# Patient Record
Sex: Female | Born: 1965 | Race: White | Hispanic: No | Marital: Married | Smoking: Former smoker
Health system: Southern US, Community
[De-identification: ages and names within clinical notes are randomized; demographics above are authoritative.]

## PROBLEM LIST (undated history)

## (undated) DIAGNOSIS — E0500 Thyrotoxicosis with diffuse goiter without thyrotoxic crisis or storm: Secondary | ICD-10-CM

## (undated) DIAGNOSIS — R011 Cardiac murmur, unspecified: Secondary | ICD-10-CM

## (undated) DIAGNOSIS — J45909 Unspecified asthma, uncomplicated: Secondary | ICD-10-CM

## (undated) DIAGNOSIS — K219 Gastro-esophageal reflux disease without esophagitis: Secondary | ICD-10-CM

## (undated) DIAGNOSIS — Z862 Personal history of diseases of the blood and blood-forming organs and certain disorders involving the immune mechanism: Secondary | ICD-10-CM

## (undated) DIAGNOSIS — M6530 Trigger finger, unspecified finger: Secondary | ICD-10-CM

## (undated) DIAGNOSIS — I10 Essential (primary) hypertension: Secondary | ICD-10-CM

## (undated) DIAGNOSIS — Z794 Long term (current) use of insulin: Secondary | ICD-10-CM

## (undated) DIAGNOSIS — Z98811 Dental restoration status: Secondary | ICD-10-CM

## (undated) DIAGNOSIS — M1990 Unspecified osteoarthritis, unspecified site: Secondary | ICD-10-CM

## (undated) DIAGNOSIS — E119 Type 2 diabetes mellitus without complications: Secondary | ICD-10-CM

## (undated) DIAGNOSIS — Z8489 Family history of other specified conditions: Secondary | ICD-10-CM

## (undated) DIAGNOSIS — IMO0001 Reserved for inherently not codable concepts without codable children: Secondary | ICD-10-CM

## (undated) DIAGNOSIS — D496 Neoplasm of unspecified behavior of brain: Secondary | ICD-10-CM

## (undated) HISTORY — PX: FOOT SURGERY: SHX648

## (undated) HISTORY — DX: Type 2 diabetes mellitus without complications: E11.9

## (undated) HISTORY — PX: TUBAL LIGATION: SHX77

## (undated) HISTORY — PX: TONSILLECTOMY AND ADENOIDECTOMY: SHX28

## (undated) HISTORY — PX: UMBILICAL HERNIA REPAIR: SHX196

## (undated) HISTORY — PX: SHOULDER ARTHROSCOPY WITH ROTATOR CUFF REPAIR: SHX5685

---

## 2003-10-02 ENCOUNTER — Emergency Department (HOSPITAL_COMMUNITY): Admission: EM | Admit: 2003-10-02 | Discharge: 2003-10-02 | Payer: Self-pay | Admitting: Emergency Medicine

## 2005-05-21 ENCOUNTER — Encounter: Admission: RE | Admit: 2005-05-21 | Discharge: 2005-05-21 | Payer: Self-pay | Admitting: General Surgery

## 2005-05-21 ENCOUNTER — Encounter (INDEPENDENT_AMBULATORY_CARE_PROVIDER_SITE_OTHER): Payer: Self-pay | Admitting: *Deleted

## 2005-05-30 ENCOUNTER — Ambulatory Visit (HOSPITAL_COMMUNITY): Admission: RE | Admit: 2005-05-30 | Discharge: 2005-05-30 | Payer: Self-pay | Admitting: General Surgery

## 2005-05-30 ENCOUNTER — Ambulatory Visit (HOSPITAL_BASED_OUTPATIENT_CLINIC_OR_DEPARTMENT_OTHER): Admission: RE | Admit: 2005-05-30 | Discharge: 2005-05-30 | Payer: Self-pay | Admitting: General Surgery

## 2005-05-30 ENCOUNTER — Encounter (INDEPENDENT_AMBULATORY_CARE_PROVIDER_SITE_OTHER): Payer: Self-pay | Admitting: *Deleted

## 2005-05-30 HISTORY — PX: BREAST MASS EXCISION: SHX1267

## 2006-01-04 ENCOUNTER — Encounter: Admission: RE | Admit: 2006-01-04 | Discharge: 2006-01-04 | Payer: Self-pay | Admitting: General Surgery

## 2006-10-30 ENCOUNTER — Encounter: Admission: RE | Admit: 2006-10-30 | Discharge: 2006-10-30 | Payer: Self-pay | Admitting: Family Medicine

## 2007-04-22 ENCOUNTER — Encounter: Admission: RE | Admit: 2007-04-22 | Discharge: 2007-04-22 | Payer: Self-pay | Admitting: Family Medicine

## 2007-11-04 ENCOUNTER — Encounter: Admission: RE | Admit: 2007-11-04 | Discharge: 2007-11-04 | Payer: Self-pay | Admitting: Family Medicine

## 2007-12-26 ENCOUNTER — Emergency Department (HOSPITAL_BASED_OUTPATIENT_CLINIC_OR_DEPARTMENT_OTHER): Admission: EM | Admit: 2007-12-26 | Discharge: 2007-12-26 | Payer: Self-pay | Admitting: Emergency Medicine

## 2008-11-15 ENCOUNTER — Encounter: Admission: RE | Admit: 2008-11-15 | Discharge: 2008-11-15 | Payer: Self-pay | Admitting: Internal Medicine

## 2010-04-13 ENCOUNTER — Emergency Department (HOSPITAL_COMMUNITY): Admission: EM | Admit: 2010-04-13 | Discharge: 2010-04-13 | Payer: Self-pay | Admitting: Emergency Medicine

## 2010-08-06 ENCOUNTER — Encounter: Payer: Self-pay | Admitting: General Surgery

## 2010-09-28 LAB — DIFFERENTIAL
Basophils Absolute: 0.1 10*3/uL (ref 0.0–0.1)
Basophils Relative: 1 % (ref 0–1)
Eosinophils Absolute: 0.4 10*3/uL (ref 0.0–0.7)
Eosinophils Relative: 3 % (ref 0–5)
Lymphocytes Relative: 24 % (ref 12–46)
Lymphs Abs: 2.8 10*3/uL (ref 0.7–4.0)
Monocytes Absolute: 0.8 10*3/uL (ref 0.1–1.0)
Monocytes Relative: 7 % (ref 3–12)
Neutro Abs: 7.7 10*3/uL (ref 1.7–7.7)
Neutrophils Relative %: 66 % (ref 43–77)

## 2010-09-28 LAB — URINALYSIS, ROUTINE W REFLEX MICROSCOPIC
Bilirubin Urine: NEGATIVE
Glucose, UA: NEGATIVE mg/dL
Ketones, ur: NEGATIVE mg/dL
Leukocytes, UA: NEGATIVE
Nitrite: NEGATIVE
Protein, ur: NEGATIVE mg/dL
Specific Gravity, Urine: 1.002 — ABNORMAL LOW (ref 1.005–1.030)
Urobilinogen, UA: 0.2 mg/dL (ref 0.0–1.0)
pH: 6 (ref 5.0–8.0)

## 2010-09-28 LAB — BASIC METABOLIC PANEL
BUN: 8 mg/dL (ref 6–23)
CO2: 24 mEq/L (ref 19–32)
Calcium: 9.2 mg/dL (ref 8.4–10.5)
Chloride: 104 mEq/L (ref 96–112)
Creatinine, Ser: 0.54 mg/dL (ref 0.4–1.2)
GFR calc Af Amer: >60 mL/min (ref >60)
GFR calc non Af Amer: >60 mL/min (ref >60)
Glucose, Bld: 169 mg/dL — ABNORMAL HIGH (ref 70–99)
Potassium: 3.9 mEq/L (ref 3.5–5.1)
Sodium: 135 mEq/L (ref 135–145)

## 2010-09-28 LAB — GLUCOSE, CAPILLARY: Glucose-Capillary: 131 mg/dL — ABNORMAL HIGH (ref 70–99)

## 2010-09-28 LAB — URINE MICROSCOPIC-ADD ON

## 2010-09-28 LAB — CBC
HCT: 44 % (ref 36.0–46.0)
Hemoglobin: 14.5 g/dL (ref 12.0–15.0)
MCH: 27.8 pg (ref 26.0–34.0)
MCHC: 33 g/dL (ref 30.0–36.0)
MCV: 84.5 fL (ref 78.0–100.0)
Platelets: 301 10*3/uL (ref 150–400)
RBC: 5.21 MIL/uL — ABNORMAL HIGH (ref 3.87–5.11)
RDW: 13 % (ref 11.5–15.5)
WBC: 11.6 10*3/uL — ABNORMAL HIGH (ref 4.0–10.5)

## 2010-12-01 NOTE — Op Note (Signed)
[name], Karen Wu                   ACCOUNT NO.:  [id number]   MEDICAL RECORD NO.:  [id number]          PATIENT TYPE:  AMB   LOCATION:  DSC                          FACILITY:  [facility]   PHYSICIAN:  Rose Phi. Maple Hudson, M.D.   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   OPERATION:  Excision of a left breast mass.   SURGEON:  Rose Phi. Maple Hudson, M.D.   ANESTHESIA:  General.   DESCRIPTION OF PROCEDURE:  The patient was placed on the operating room  table after suitable general anesthesia was induced, and the left breast  prepped and draped in the usual fashion.  The palpable nodule was deep in  the breast at about the 2:30 position.  A curved incision overlying it was  made, and then an excision of the mass was carried out.  Hemostasis was  obtained with the cautery.  The tissue was infiltrated with 0.25% Marcaine  and then closed in a single layer with subcuticular #4-0 Monocryl and Steri-  Strips.  A dressing was applied.   The patient was transferred to the recovery room in satisfactory condition,  having tolerated the procedure well.      Rose Phi. Maple Hudson, M.D.  Electronically Signed     PRY/MEDQ  D:  05/30/2005  T:  05/30/2005  Job:  [id number]

## 2011-04-12 LAB — BASIC METABOLIC PANEL
BUN: 11
Chloride: 104
Glucose, Bld: 267 — ABNORMAL HIGH
Potassium: 4.2

## 2011-04-12 LAB — CBC
HCT: 38.7
MCV: 84.1
Platelets: 247
RDW: 12
WBC: 8.5

## 2011-04-12 LAB — DIFFERENTIAL
Eosinophils Absolute: 0.2
Eosinophils Relative: 2
Lymphs Abs: 1.4

## 2011-04-12 LAB — URINALYSIS, ROUTINE W REFLEX MICROSCOPIC
Bilirubin Urine: NEGATIVE
Glucose, UA: >1000 — AB
Hgb urine dipstick: NEGATIVE
Ketones, ur: NEGATIVE
Leukocytes, UA: NEGATIVE
Nitrite: NEGATIVE
Protein, ur: NEGATIVE
Specific Gravity, Urine: 1.028
Urobilinogen, UA: 0.2
pH: 5.5

## 2011-04-12 LAB — URINE MICROSCOPIC-ADD ON

## 2011-04-12 LAB — PREGNANCY, URINE: Preg Test, Ur: NEGATIVE

## 2011-10-29 ENCOUNTER — Other Ambulatory Visit: Payer: Self-pay | Admitting: Family Medicine

## 2011-10-29 DIAGNOSIS — Z1231 Encounter for screening mammogram for malignant neoplasm of breast: Secondary | ICD-10-CM

## 2011-11-06 ENCOUNTER — Ambulatory Visit
Admission: RE | Admit: 2011-11-06 | Discharge: 2011-11-06 | Disposition: A | Discharge status: Home / Self Care | Payer: Federal, State, Local not specified - PPO | Source: Ambulatory Visit | Attending: Family Medicine | Admitting: Family Medicine

## 2011-11-06 DIAGNOSIS — Z1231 Encounter for screening mammogram for malignant neoplasm of breast: Secondary | ICD-10-CM

## 2012-08-06 ENCOUNTER — Encounter (INDEPENDENT_AMBULATORY_CARE_PROVIDER_SITE_OTHER): Payer: Federal, State, Local not specified - PPO | Admitting: Ophthalmology

## 2012-08-06 DIAGNOSIS — H43819 Vitreous degeneration, unspecified eye: Secondary | ICD-10-CM

## 2012-08-06 DIAGNOSIS — H4600 Optic papillitis, unspecified eye: Secondary | ICD-10-CM

## 2012-08-06 DIAGNOSIS — H2510 Age-related nuclear cataract, unspecified eye: Secondary | ICD-10-CM

## 2012-08-06 DIAGNOSIS — E1165 Type 2 diabetes mellitus with hyperglycemia: Secondary | ICD-10-CM

## 2012-08-06 DIAGNOSIS — H35039 Hypertensive retinopathy, unspecified eye: Secondary | ICD-10-CM

## 2012-08-06 DIAGNOSIS — E11319 Type 2 diabetes mellitus with unspecified diabetic retinopathy without macular edema: Secondary | ICD-10-CM

## 2012-08-06 DIAGNOSIS — E1139 Type 2 diabetes mellitus with other diabetic ophthalmic complication: Secondary | ICD-10-CM

## 2012-08-06 DIAGNOSIS — I10 Essential (primary) hypertension: Secondary | ICD-10-CM

## 2012-09-03 ENCOUNTER — Ambulatory Visit: Payer: Self-pay | Admitting: Ophthalmology

## 2012-09-03 LAB — CREATININE, SERUM: EGFR (African American): >60

## 2012-10-22 DIAGNOSIS — G939 Disorder of brain, unspecified: Secondary | ICD-10-CM | POA: Insufficient documentation

## 2014-01-13 HISTORY — PX: ENDOMETRIAL ABLATION: SHX621

## 2014-01-13 HISTORY — PX: BLADDER SUSPENSION: SHX72

## 2014-03-16 HISTORY — PX: MICROLARYNGOSCOPY WITH CO2 LASER AND EXCISION OF VOCAL CORD LESION: SHX5970

## 2014-06-02 DIAGNOSIS — D429 Neoplasm of uncertain behavior of meninges, unspecified: Secondary | ICD-10-CM | POA: Insufficient documentation

## 2014-10-15 DIAGNOSIS — M6530 Trigger finger, unspecified finger: Secondary | ICD-10-CM

## 2014-10-15 HISTORY — DX: Trigger finger, unspecified finger: M65.30

## 2014-10-21 ENCOUNTER — Other Ambulatory Visit: Payer: Self-pay | Admitting: Orthopedic Surgery

## 2014-10-21 ENCOUNTER — Encounter (HOSPITAL_BASED_OUTPATIENT_CLINIC_OR_DEPARTMENT_OTHER): Payer: Self-pay | Admitting: *Deleted

## 2014-10-22 NOTE — Pre-Procedure Instructions (Addendum)
To come for BMET and EKG; labs done at [place] are > 30 days, and last EKG was in 2014. EKG from 01/14/2014 received from [facility]

## 2014-10-26 ENCOUNTER — Encounter (HOSPITAL_BASED_OUTPATIENT_CLINIC_OR_DEPARTMENT_OTHER)
Admission: RE | Admit: 2014-10-26 | Discharge: 2014-10-26 | Disposition: A | Discharge status: Home / Self Care | Payer: Federal, State, Local not specified - PPO | Source: Ambulatory Visit | Attending: Orthopedic Surgery | Admitting: Orthopedic Surgery

## 2014-10-26 DIAGNOSIS — F17210 Nicotine dependence, cigarettes, uncomplicated: Secondary | ICD-10-CM | POA: Diagnosis not present

## 2014-10-26 DIAGNOSIS — E119 Type 2 diabetes mellitus without complications: Secondary | ICD-10-CM | POA: Diagnosis not present

## 2014-10-26 DIAGNOSIS — K219 Gastro-esophageal reflux disease without esophagitis: Secondary | ICD-10-CM | POA: Diagnosis not present

## 2014-10-26 DIAGNOSIS — M1990 Unspecified osteoarthritis, unspecified site: Secondary | ICD-10-CM | POA: Diagnosis not present

## 2014-10-26 DIAGNOSIS — I10 Essential (primary) hypertension: Secondary | ICD-10-CM | POA: Diagnosis not present

## 2014-10-26 DIAGNOSIS — Z9851 Tubal ligation status: Secondary | ICD-10-CM | POA: Diagnosis not present

## 2014-10-26 DIAGNOSIS — Z794 Long term (current) use of insulin: Secondary | ICD-10-CM | POA: Diagnosis not present

## 2014-10-26 DIAGNOSIS — M65311 Trigger thumb, right thumb: Secondary | ICD-10-CM | POA: Diagnosis present

## 2014-10-26 DIAGNOSIS — D649 Anemia, unspecified: Secondary | ICD-10-CM | POA: Diagnosis not present

## 2014-10-26 DIAGNOSIS — E0500 Thyrotoxicosis with diffuse goiter without thyrotoxic crisis or storm: Secondary | ICD-10-CM | POA: Diagnosis not present

## 2014-10-26 DIAGNOSIS — J45909 Unspecified asthma, uncomplicated: Secondary | ICD-10-CM | POA: Diagnosis not present

## 2014-10-26 DIAGNOSIS — Z7982 Long term (current) use of aspirin: Secondary | ICD-10-CM | POA: Diagnosis not present

## 2014-10-26 LAB — BASIC METABOLIC PANEL
Anion gap: 12 (ref 5–15)
BUN: <5 mg/dL — ABNORMAL LOW (ref 6–23)
CALCIUM: 8.9 mg/dL (ref 8.4–10.5)
CHLORIDE: 101 mmol/L (ref 96–112)
CO2: 23 mmol/L (ref 19–32)
CREATININE: 0.6 mg/dL (ref 0.50–1.10)
GFR calc Af Amer: >90 mL/min (ref >90)
GLUCOSE: 133 mg/dL — AB (ref 70–99)
POTASSIUM: 4.3 mmol/L (ref 3.5–5.1)
Sodium: 136 mmol/L (ref 135–145)

## 2014-10-28 ENCOUNTER — Ambulatory Visit (HOSPITAL_BASED_OUTPATIENT_CLINIC_OR_DEPARTMENT_OTHER): Payer: Federal, State, Local not specified - PPO | Admitting: Anesthesiology

## 2014-10-28 ENCOUNTER — Encounter (HOSPITAL_BASED_OUTPATIENT_CLINIC_OR_DEPARTMENT_OTHER)
Admission: RE | Disposition: A | Discharge status: Home / Self Care | Payer: Self-pay | Source: Ambulatory Visit | Attending: Orthopedic Surgery

## 2014-10-28 ENCOUNTER — Encounter (HOSPITAL_BASED_OUTPATIENT_CLINIC_OR_DEPARTMENT_OTHER): Payer: Self-pay | Admitting: *Deleted

## 2014-10-28 ENCOUNTER — Ambulatory Visit (HOSPITAL_BASED_OUTPATIENT_CLINIC_OR_DEPARTMENT_OTHER)
Admission: RE | Admit: 2014-10-28 | Discharge: 2014-10-28 | Disposition: A | Discharge status: Home / Self Care | Payer: Federal, State, Local not specified - PPO | Source: Ambulatory Visit | Attending: Orthopedic Surgery | Admitting: Orthopedic Surgery

## 2014-10-28 DIAGNOSIS — J45909 Unspecified asthma, uncomplicated: Secondary | ICD-10-CM | POA: Insufficient documentation

## 2014-10-28 DIAGNOSIS — Z9851 Tubal ligation status: Secondary | ICD-10-CM | POA: Insufficient documentation

## 2014-10-28 DIAGNOSIS — M65311 Trigger thumb, right thumb: Secondary | ICD-10-CM | POA: Diagnosis not present

## 2014-10-28 DIAGNOSIS — E0500 Thyrotoxicosis with diffuse goiter without thyrotoxic crisis or storm: Secondary | ICD-10-CM | POA: Insufficient documentation

## 2014-10-28 DIAGNOSIS — Z794 Long term (current) use of insulin: Secondary | ICD-10-CM | POA: Insufficient documentation

## 2014-10-28 DIAGNOSIS — F17210 Nicotine dependence, cigarettes, uncomplicated: Secondary | ICD-10-CM | POA: Insufficient documentation

## 2014-10-28 DIAGNOSIS — I10 Essential (primary) hypertension: Secondary | ICD-10-CM | POA: Insufficient documentation

## 2014-10-28 DIAGNOSIS — D649 Anemia, unspecified: Secondary | ICD-10-CM | POA: Insufficient documentation

## 2014-10-28 DIAGNOSIS — Z7982 Long term (current) use of aspirin: Secondary | ICD-10-CM | POA: Insufficient documentation

## 2014-10-28 DIAGNOSIS — M1990 Unspecified osteoarthritis, unspecified site: Secondary | ICD-10-CM | POA: Insufficient documentation

## 2014-10-28 DIAGNOSIS — K219 Gastro-esophageal reflux disease without esophagitis: Secondary | ICD-10-CM | POA: Insufficient documentation

## 2014-10-28 DIAGNOSIS — E119 Type 2 diabetes mellitus without complications: Secondary | ICD-10-CM | POA: Insufficient documentation

## 2014-10-28 HISTORY — DX: Unspecified osteoarthritis, unspecified site: M19.90

## 2014-10-28 HISTORY — DX: Neoplasm of unspecified behavior of brain: D49.6

## 2014-10-28 HISTORY — DX: Unspecified asthma, uncomplicated: J45.909

## 2014-10-28 HISTORY — DX: Cardiac murmur, unspecified: R01.1

## 2014-10-28 HISTORY — DX: Reserved for inherently not codable concepts without codable children: IMO0001

## 2014-10-28 HISTORY — DX: Dental restoration status: Z98.811

## 2014-10-28 HISTORY — DX: Family history of other specified conditions: Z84.89

## 2014-10-28 HISTORY — DX: Long term (current) use of insulin: Z79.4

## 2014-10-28 HISTORY — DX: Trigger finger, unspecified finger: M65.30

## 2014-10-28 HISTORY — PX: TRIGGER FINGER RELEASE: SHX641

## 2014-10-28 HISTORY — DX: Personal history of diseases of the blood and blood-forming organs and certain disorders involving the immune mechanism: Z86.2

## 2014-10-28 HISTORY — DX: Thyrotoxicosis with diffuse goiter without thyrotoxic crisis or storm: E05.00

## 2014-10-28 HISTORY — DX: Essential (primary) hypertension: I10

## 2014-10-28 HISTORY — DX: Type 2 diabetes mellitus without complications: E11.9

## 2014-10-28 HISTORY — DX: Gastro-esophageal reflux disease without esophagitis: K21.9

## 2014-10-28 LAB — GLUCOSE, CAPILLARY
GLUCOSE-CAPILLARY: 151 mg/dL — AB (ref 70–99)
Glucose-Capillary: 173 mg/dL — ABNORMAL HIGH (ref 70–99)

## 2014-10-28 LAB — POCT HEMOGLOBIN-HEMACUE: Hemoglobin: 14.4 g/dL (ref 12.0–15.0)

## 2014-10-28 SURGERY — RELEASE, A1 PULLEY, FOR TRIGGER FINGER
Anesthesia: Monitor Anesthesia Care | Site: Finger | Laterality: Right

## 2014-10-28 MED ORDER — OXYCODONE HCL 5 MG/5ML PO SOLN: 5.0000 mg | Freq: Once | ORAL | Status: DC | PRN

## 2014-10-28 MED ORDER — MIDAZOLAM HCL 5 MG/5ML IJ SOLN
INTRAMUSCULAR | Status: DC | PRN
  Administered 2014-10-28: 2 mg via INTRAVENOUS

## 2014-10-28 MED ORDER — MIDAZOLAM HCL 2 MG/2ML IJ SOLN
INTRAMUSCULAR | Status: AC
  Filled 2014-10-28: qty 2

## 2014-10-28 MED ORDER — HYDROCODONE-ACETAMINOPHEN 5-325 MG PO TABS: ORAL_TABLET | ORAL | Status: DC

## 2014-10-28 MED ORDER — OXYCODONE HCL 5 MG PO TABS: 5.0000 mg | ORAL_TABLET | Freq: Once | ORAL | Status: DC | PRN

## 2014-10-28 MED ORDER — BUPIVACAINE HCL (PF) 0.25 % IJ SOLN
INTRAMUSCULAR | Status: DC | PRN
  Administered 2014-10-28: 10 mL

## 2014-10-28 MED ORDER — MIDAZOLAM HCL 2 MG/2ML IJ SOLN: 1.0000 mg | INTRAMUSCULAR | Status: DC | PRN

## 2014-10-28 MED ORDER — ONDANSETRON HCL 4 MG/2ML IJ SOLN
INTRAMUSCULAR | Status: DC | PRN
  Administered 2014-10-28: 4 mg via INTRAVENOUS

## 2014-10-28 MED ORDER — LIDOCAINE HCL (CARDIAC) 20 MG/ML IV SOLN
INTRAVENOUS | Status: DC | PRN
  Administered 2014-10-28: 50 mg via INTRAVENOUS

## 2014-10-28 MED ORDER — PROPOFOL INFUSION 10 MG/ML OPTIME
INTRAVENOUS | Status: DC | PRN
  Administered 2014-10-28: 100 ug/kg/min via INTRAVENOUS

## 2014-10-28 MED ORDER — HYDROMORPHONE HCL 1 MG/ML IJ SOLN: 0.2500 mg | INTRAMUSCULAR | Status: DC | PRN

## 2014-10-28 MED ORDER — 0.9 % SODIUM CHLORIDE (POUR BTL) OPTIME
TOPICAL | Status: DC | PRN
  Administered 2014-10-28: 200 mL

## 2014-10-28 MED ORDER — CEFAZOLIN SODIUM-DEXTROSE 2-3 GM-% IV SOLR
2.0000 g | INTRAVENOUS | Status: AC
Start: 2014-10-28 — End: 2014-10-28
  Administered 2014-10-28: 2 g via INTRAVENOUS

## 2014-10-28 MED ORDER — PROMETHAZINE HCL 25 MG/ML IJ SOLN: 6.2500 mg | INTRAMUSCULAR | Status: DC | PRN

## 2014-10-28 MED ORDER — MIDAZOLAM HCL 2 MG/ML PO SYRP: 12.0000 mg | ORAL_SOLUTION | Freq: Once | ORAL | Status: DC | PRN

## 2014-10-28 MED ORDER — CEFAZOLIN SODIUM-DEXTROSE 2-3 GM-% IV SOLR
INTRAVENOUS | Status: AC
  Filled 2014-10-28: qty 50

## 2014-10-28 MED ORDER — LACTATED RINGERS IV SOLN
INTRAVENOUS | Status: DC
  Administered 2014-10-28 (×2): via INTRAVENOUS

## 2014-10-28 MED ORDER — FENTANYL CITRATE 0.05 MG/ML IJ SOLN: 50.0000 ug | INTRAMUSCULAR | Status: DC | PRN

## 2014-10-28 MED ORDER — FENTANYL CITRATE 0.05 MG/ML IJ SOLN
INTRAMUSCULAR | Status: AC
  Filled 2014-10-28: qty 4

## 2014-10-28 MED ORDER — CHLORHEXIDINE GLUCONATE 4 % EX LIQD: 60.0000 mL | Freq: Once | CUTANEOUS | Status: DC

## 2014-10-28 MED ORDER — FENTANYL CITRATE 0.05 MG/ML IJ SOLN
INTRAMUSCULAR | Status: DC | PRN
  Administered 2014-10-28: 50 ug via INTRAVENOUS
  Administered 2014-10-28: 100 ug via INTRAVENOUS
  Administered 2014-10-28: 50 ug via INTRAVENOUS

## 2014-10-28 SURGICAL SUPPLY — 35 items
BANDAGE COBAN STERILE 2 (GAUZE/BANDAGES/DRESSINGS) ×2 IMPLANT
BLADE MINI RND TIP GREEN BEAV (BLADE) IMPLANT
BLADE SURG 15 STRL LF DISP TIS (BLADE) ×2 IMPLANT
BLADE SURG 15 STRL SS (BLADE) ×4
BNDG CMPR 9X4 STRL LF SNTH (GAUZE/BANDAGES/DRESSINGS)
BNDG CONFORM 2 STRL LF (GAUZE/BANDAGES/DRESSINGS) ×2 IMPLANT
BNDG ESMARK 4X9 LF (GAUZE/BANDAGES/DRESSINGS) IMPLANT
CHLORAPREP W/TINT 26ML (MISCELLANEOUS) ×2 IMPLANT
CORDS BIPOLAR (ELECTRODE) ×2 IMPLANT
COVER BACK TABLE 60X90IN (DRAPES) ×2 IMPLANT
COVER MAYO STAND STRL (DRAPES) ×2 IMPLANT
CUFF TOURNIQUET SINGLE 18IN (TOURNIQUET CUFF) ×2 IMPLANT
DRAPE EXTREMITY T 121X128X90 (DRAPE) ×2 IMPLANT
DRAPE SURG 17X23 STRL (DRAPES) ×2 IMPLANT
GAUZE SPONGE 4X4 12PLY STRL (GAUZE/BANDAGES/DRESSINGS) ×2 IMPLANT
GAUZE XEROFORM 1X8 LF (GAUZE/BANDAGES/DRESSINGS) ×2 IMPLANT
GLOVE BIO SURGEON STRL SZ7.5 (GLOVE) ×2 IMPLANT
GLOVE BIOGEL PI IND STRL 7.0 (GLOVE) ×2 IMPLANT
GLOVE BIOGEL PI IND STRL 8 (GLOVE) ×1 IMPLANT
GLOVE BIOGEL PI INDICATOR 7.0 (GLOVE) ×2
GLOVE BIOGEL PI INDICATOR 8 (GLOVE) ×1
GOWN STRL REUS W/ TWL LRG LVL3 (GOWN DISPOSABLE) ×1 IMPLANT
GOWN STRL REUS W/TWL LRG LVL3 (GOWN DISPOSABLE) ×2
NDL HYPO 25X1 1.5 SAFETY (NEEDLE) IMPLANT
NEEDLE HYPO 25X1 1.5 SAFETY (NEEDLE) ×2 IMPLANT
NS IRRIG 1000ML POUR BTL (IV SOLUTION) ×2 IMPLANT
PACK BASIN DAY SURGERY FS (CUSTOM PROCEDURE TRAY) ×2 IMPLANT
PADDING CAST ABS 4INX4YD NS (CAST SUPPLIES) ×1
PADDING CAST ABS COTTON 4X4 ST (CAST SUPPLIES) ×1 IMPLANT
STOCKINETTE 4X48 STRL (DRAPES) ×2 IMPLANT
SUT ETHILON 4 0 PS 2 18 (SUTURE) ×2 IMPLANT
SYR BULB 3OZ (MISCELLANEOUS) ×2 IMPLANT
SYR CONTROL 10ML LL (SYRINGE) ×1 IMPLANT
TOWEL OR 17X24 6PK STRL BLUE (TOWEL DISPOSABLE) ×2 IMPLANT
UNDERPAD 30X30 INCONTINENT (UNDERPADS AND DIAPERS) ×2 IMPLANT

## 2014-10-28 NOTE — Anesthesia Postprocedure Evaluation (Signed)
Anesthesia Post Note  Patient: Karen Wu  Procedure(s) Performed: Procedure(s) (LRB): RIGHT THUMB AND LONG FINGER RELEASE TRIGGER  (Right)  Anesthesia type: MAC  Patient location: PACU  Post pain: Pain level controlled  Post assessment: Patient's Cardiovascular Status Stable  Last Vitals:  Filed Vitals:   10/28/14 1000  BP: 137/69  Pulse: 67  Temp:   Resp: 15    Post vital signs: Reviewed and stable  Level of consciousness: sedated  Complications: No apparent anesthesia complications

## 2014-10-28 NOTE — Op Note (Signed)
[name], [name]:  [id number]  MEDICAL RECORD NO.:  [id number]  LOCATION:                                 FACILITY:  PHYSICIAN:  Leanora Cover, MD             DATE OF BIRTH:  DATE OF PROCEDURE:  10/28/2014 DATE OF DISCHARGE:                              OPERATIVE REPORT   PREOPERATIVE DIAGNOSIS:  Right thumb and long finger trigger digit.  POSTOPERATIVE DIAGNOSIS:  Right thumb and long finger trigger digit.  PROCEDURE:  Right thumb and long finger trigger releases.  SURGEON:  Leanora Cover, MD  ASSISTANTS:  None.  ANESTHESIA:  Bier block with sedation.  IV FLUIDS:  Per Anesthesia flow sheet.  ESTIMATED BLOOD LOSS:  Minimal.  COMPLICATIONS:  None.  SPECIMENS:  None.  TOURNIQUET TIME:  Twenty seven minutes.  DISPOSITION:  Stable to PACU.  INDICATIONS:  [name] is a 49 year old female who has had triggering of the right thumb and long finger.  This is bothersome to her.  She sometimes has to use her other hand to straighten her fingers out.  She was unable to receive injections secondary to reaction to cortisone. She wishes to have thumb and long finger trigger releases.  Risks, benefits, alternatives of surgery were discussed including risk of blood loss, infection, damage to nerves, vessels, tendons, ligaments, bone; failure of surgery; need for additional surgery, complications with wound healing, continued pain, recurrence of triggering.  She voiced understanding of these risks and elected to proceed.  OPERATIVE COURSE:  After being identified preoperatively by myself, the patient and I agreed upon procedure and site procedure.  Surgical site was marked.  The risks, benefits, and alternatives of surgery were reviewed and she wished to proceed.  Surgical consent had been signed. She was given IV Ancef as preoperative antibiotic prophylaxis.  She was transferred to the operating room, placed on the operating room  table in supine  position with the right upper extremity on armboard.  Bier block anesthesia was induced by anesthesiologist.  The right upper extremity was prepped and draped in normal sterile orthopedic fashion. Surgical pause was performed between surgeons, anesthesia, and operating staff, and all were in agreement as to the patient, procedure, and site procedure.  Tourniquet at the proximal aspect of the forearm had been inflated for the Bier block.  Incision was made at the volar aspect of the MP flexion crease of the thumb.  This was carried into subcutaneous tissues by spreading technique.  The radial and ulnar digital nerves were identified and protected throughout the case.  The A1 pulley was identified and sharply incised.  The tendon was brought through the wound.  There was no recurrence of triggering.  Attention was turned to the long finger.  Again an incision was made at the volar aspect of the MP joint.  This was carried into subcutaneous tissues by spreading technique.  Radial and ulnar digital nerves were protected throughout the case.  The A1 pulley was identified and sharply incised.  It was incised in its entirety.  The proximal 1-2 mm of the A2 pulley  was vented to allow the tendon to slide in easier.  The tendons were brought out through the wound.  There was adherence between them which was released.  The wounds were copiously irrigated with sterile saline and closed with 4-0 nylon in horizontal mattress fashion.  They were injected with 10 mL total of 0.25% plain Marcaine to aid in postoperative analgesia.  They were then dressed with sterile Xeroform, 4x4s, and wrapped with a Kling and Coban dressing lightly.  Tourniquet was deflated at 27 minutes.  The fingertips were pink with brisk capillary refill after deflation of tourniquet.  Operative drapes were broken down.  The patient was awoken from anesthesia safely.  She was transferred back to stretcher and taken to PACU in stable  condition.  I will see her back in the office in 1 week for postoperative followup.  I will give her Norco 5/325, 1-2 p.o. q.6 hours p.r.n. pain, dispensed #30.     Leanora Cover, MD     KK/MEDQ  D:  10/28/2014  T:  10/28/2014  Job:  [id number]

## 2014-10-28 NOTE — Anesthesia Procedure Notes (Signed)
Anesthesia Regional Block:  Bier block (IV Regional)  Pre-Anesthetic Checklist: ,, timeout performed, Correct Patient, Correct Site, Correct Laterality, Correct Procedure,, site marked, surgical consent,, at surgeon's request Needles:  Injection technique: Single-shot  Needle Type: Other      Needle Gauge: 22 and 22 G    Additional Needles: Bier block (IV Regional) Narrative:  Start time: 10/28/2014 8:51 AM End time: 10/28/2014 8:52 AM Injection made incrementally with aspirations every 35 mL.  Performed by: Personally   Additional Notes: Esmark wrap, torq inflated 250, neg pulse, inject 0.5% pres free lidocaine. Pt tol well, VSS, no neg symptoms.

## 2014-10-28 NOTE — Transfer of Care (Signed)
Immediate Anesthesia Transfer of Care Note  Patient: Karen Wu  Procedure(s) Performed: Procedure(s): RIGHT THUMB AND LONG FINGER RELEASE TRIGGER  (Right)  Patient Location: PACU  Anesthesia Type:MAC and Bier block  Level of Consciousness: awake  Airway & Oxygen Therapy: Patient Spontanous Breathing and Patient connected to face mask oxygen  Post-op Assessment: Report given to RN and Post -op Vital signs reviewed and stable  Post vital signs: Reviewed and stable  Last Vitals:  Filed Vitals:   10/28/14 0719  BP: 142/80  Pulse: 67  Temp: 36.9 C  Resp: 18    Complications: No apparent anesthesia complications

## 2014-10-28 NOTE — Op Note (Signed)
[id number] 

## 2014-10-28 NOTE — H&P (Signed)
Karen Wu is an 49 y.o. female.   Chief Complaint: right thumb and long finger trigger digits HPI: 49 yo female with triggering of right thumb and long finger x several weeks.  It is bothersome to her.  She occasionally has to use the other hand to straighten her finger out.  She wishes to have trigger releases.  Past Medical History  Diagnosis Date  . History of anemia     no problems since endometrial ablation  . Arthritis     hips, knees, lower back  . GERD (gastroesophageal reflux disease)     OTC as needed  . Graves' disease     no current med.  . 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 Insulin dependent diabetes mellitus   . Hypertension     under control with meds., per pt.; has been on med. x 21 yr.  . Asthma     rare inhaler use  . Heart murmur     as a child, states no problems  . Brain tumor     x 2 - appear to be meningiomas, per neurosurgeon's report  . Dental crown present   . Family history of adverse reaction to anesthesia     pt's father woke up during surgery; pt's mother has hx. of being hard to wake up post-op    Past Surgical History  Procedure Laterality Date  . Tubal ligation    . Umbilical hernia repair    . Tonsillectomy and adenoidectomy    . Bladder suspension  01/2014  . Endometrial ablation  01/2014  . Shoulder arthroscopy with rotator cuff repair Right   . Foot surgery Left     exc. cyst and bone fragments  . Microlaryngoscopy with co2 laser and excision of vocal cord lesion  03/2014  . Breast mass excision Left 05/30/2005    Family History  Problem Relation Age of Onset  . Anesthesia problems Mother     hard to wake up post-op  . Anesthesia problems Father     woke up during surgery   Social History:  reports that she has been smoking Cigarettes.  She has a 45 pack-year smoking history. She has never used smokeless tobacco. She reports that she does not drink alcohol or use illicit drugs.  Allergies:   Allergies  Allergen Reactions  . Cortisone Shortness Of Breath    Medications Prior to Admission  Medication Sig Dispense Refill  . amLODipine (NORVASC) 5 MG tablet Take 5 mg by mouth daily.    Marland Kitchen aspirin 81 MG tablet Take 81 mg by mouth daily.    . insulin detemir (LEVEMIR) 100 UNIT/ML injection Inject 85 Units into the skin at bedtime.    . insulin detemir (LEVEMIR) 100 UNIT/ML injection Inject 50 Units into the skin daily with breakfast.    . losartan-hydrochlorothiazide (HYZAAR) 100-25 MG per tablet Take 1 tablet by mouth daily.    . metFORMIN (GLUCOPHAGE) 1000 MG tablet Take 1,000 mg by mouth 2 (two) times daily with a meal.    . Multiple Vitamin (MULTIVITAMIN) tablet Take 1 tablet by mouth daily.    . rosuvastatin (CRESTOR) 20 MG tablet Take 20 mg by mouth daily.    Marland Kitchen venlafaxine (EFFEXOR) 75 MG tablet Take 75 mg by mouth daily before breakfast.    . venlafaxine (EFFEXOR) 75 MG tablet Take 150 mg by mouth at bedtime.      Results for orders placed or performed  during the hospital encounter of 10/28/14 (from the past 48 hour(s))  Basic metabolic panel     Status: Abnormal   Collection Time: 10/26/14  1:00 PM  Result Value Ref Range   Sodium 136 135 - 145 mmol/L   Potassium 4.3 3.5 - 5.1 mmol/L   Chloride 101 96 - 112 mmol/L   CO2 23 19 - 32 mmol/L   Glucose, Bld 133 (H) 70 - 99 mg/dL   BUN <5 (L) 6 - 23 mg/dL   Creatinine, Ser 0.60 0.50 - 1.10 mg/dL   Calcium 8.9 8.4 - 10.5 mg/dL   GFR calc non Af Amer >90 >90 mL/min   GFR calc Af Amer >90 >90 mL/min    Comment: (NOTE) The eGFR has been calculated using the CKD EPI equation. This calculation has not been validated in all clinical situations. eGFR's persistently <90 mL/min signify possible Chronic Kidney Disease.    Anion gap 12 5 - 15  Hemoglobin-hemacue, POC     Status: None   Collection Time: 10/28/14  7:36 AM  Result Value Ref Range   Hemoglobin 14.4 12.0 - 15.0 g/dL  Glucose, capillary     Status: Abnormal    Collection Time: 10/28/14  7:39 AM  Result Value Ref Range   Glucose-Capillary 173 (H) 70 - 99 mg/dL    No results found.   A comprehensive review of systems was negative except for: Eyes: positive for contacts/glasses Respiratory: positive for asthma Hematologic/lymphatic: positive for easy bruising Behavioral/Psych: positive for depression  Blood pressure 142/80, pulse 67, temperature 98.4 F (36.9 C), temperature source Oral, resp. rate 18, height 5' 5" (1.651 m), weight 96.163 kg (212 lb), last menstrual period 09/30/2014, SpO2 96 %.  General appearance: alert, cooperative and appears stated age Head: Normocephalic, without obvious abnormality, atraumatic Neck: supple, symmetrical, trachea midline Resp: clear to auscultation bilaterally Cardio: regular rate and rhythm GI: non tender Extremities: intact sensation and capillary refill all digits.  +epl/fpl/io.  no wounds. Pulses: 2+ and symmetric Skin: Skin color, texture, turgor normal. No rashes or lesions Neurologic: Grossly normal Incision/Wound:  none  Assessment/Plan Right long finger and thumb trigger digits.  Non operative and operative treatment options were discussed with the patient and patient wishes to proceed with operative treatment. Risks, benefits, and alternatives of surgery were discussed and the patient agrees with the plan of care.   Shatara Stanek R 10/28/2014, 8:35 AM

## 2014-10-28 NOTE — Discharge Instructions (Addendum)

## 2014-10-28 NOTE — Brief Op Note (Signed)
10/28/2014  9:32 AM  PATIENT:  Wynelle Beckmann  49 y.o. female  PRE-OPERATIVE DIAGNOSIS:  right thumb and long finger trigger digits  M65.311/M65.331  POST-OPERATIVE DIAGNOSIS:  right thumb and long finger trigger  PROCEDURE:  Procedure(s): RIGHT THUMB AND LONG FINGER RELEASE TRIGGER  (Right)  SURGEON:  Surgeon(s) and Role:    * Leanora Cover, MD - Primary  PHYSICIAN ASSISTANT:   ASSISTANTS: none   ANESTHESIA:   Bier block with sedation  EBL:  Total I/O In: 1200 [I.V.:1200] Out: -   BLOOD ADMINISTERED:none  DRAINS: none   LOCAL MEDICATIONS USED:  MARCAINE     SPECIMEN:  No Specimen  DISPOSITION OF SPECIMEN:  N/A  COUNTS:  YES  TOURNIQUET:   Total Tourniquet Time Documented: Forearm (Right) - 27 minutes Total: Forearm (Right) - 27 minutes   DICTATION: .Other Dictation: Dictation Number [id number]  PLAN OF CARE: Discharge to home after PACU  PATIENT DISPOSITION:  PACU - hemodynamically stable.

## 2014-10-28 NOTE — Anesthesia Preprocedure Evaluation (Addendum)
Anesthesia Evaluation  Patient identified by MRN, date of birth, ID band Patient awake    Reviewed: Allergy & Precautions, NPO status , Patient's Chart, lab work & pertinent test results  History of Anesthesia Complications Negative for: history of anesthetic complications  Airway Mallampati: II  TM Distance: >3 FB Neck ROM: Full    Dental  (+) Teeth Intact, Dental Advisory Given   Pulmonary asthma , Current Smoker,    Pulmonary exam normal       Cardiovascular hypertension,     Neuro/Psych negative neurological ROS  negative psych ROS   GI/Hepatic GERD-  Medicated,  Endo/Other  diabetesHyperthyroidism Morbid obesity  Renal/GU      Musculoskeletal  (+) Arthritis -,   Abdominal   Peds  Hematology   Anesthesia Other Findings   Reproductive/Obstetrics                            Anesthesia Physical Anesthesia Plan  ASA: III  Anesthesia Plan: MAC and Bier Block   Post-op Pain Management:    Induction: Intravenous  Airway Management Planned: Simple Face Mask  Additional Equipment:   Intra-op Plan:   Post-operative Plan:   Informed Consent: I have reviewed the patients History and Physical, chart, labs and discussed the procedure including the risks, benefits and alternatives for the proposed anesthesia with the patient or authorized representative who has indicated his/her understanding and acceptance.   Dental advisory given  Plan Discussed with: Anesthesiologist, Surgeon and CRNA  Anesthesia Plan Comments:        Anesthesia Quick Evaluation

## 2014-10-29 ENCOUNTER — Encounter (HOSPITAL_BASED_OUTPATIENT_CLINIC_OR_DEPARTMENT_OTHER): Payer: Self-pay | Admitting: Orthopedic Surgery

## 2015-11-09 ENCOUNTER — Other Ambulatory Visit: Payer: Self-pay

## 2015-11-09 DIAGNOSIS — D1723 Benign lipomatous neoplasm of skin and subcutaneous tissue of right leg: Secondary | ICD-10-CM | POA: Diagnosis not present

## 2015-12-16 DIAGNOSIS — M150 Primary generalized (osteo)arthritis: Secondary | ICD-10-CM | POA: Diagnosis not present

## 2015-12-16 DIAGNOSIS — E1165 Type 2 diabetes mellitus with hyperglycemia: Secondary | ICD-10-CM | POA: Diagnosis not present

## 2015-12-16 DIAGNOSIS — M2550 Pain in unspecified joint: Secondary | ICD-10-CM | POA: Diagnosis not present

## 2015-12-16 DIAGNOSIS — R768 Other specified abnormal immunological findings in serum: Secondary | ICD-10-CM | POA: Diagnosis not present

## 2015-12-23 DIAGNOSIS — Z720 Tobacco use: Secondary | ICD-10-CM | POA: Diagnosis not present

## 2015-12-23 DIAGNOSIS — E1165 Type 2 diabetes mellitus with hyperglycemia: Secondary | ICD-10-CM | POA: Diagnosis not present

## 2015-12-23 DIAGNOSIS — Z1389 Encounter for screening for other disorder: Secondary | ICD-10-CM | POA: Diagnosis not present

## 2016-03-16 DIAGNOSIS — R358 Other polyuria: Secondary | ICD-10-CM | POA: Diagnosis not present

## 2016-03-16 DIAGNOSIS — N3941 Urge incontinence: Secondary | ICD-10-CM | POA: Diagnosis not present

## 2016-03-16 DIAGNOSIS — R339 Retention of urine, unspecified: Secondary | ICD-10-CM | POA: Diagnosis not present

## 2016-03-16 DIAGNOSIS — R300 Dysuria: Secondary | ICD-10-CM | POA: Diagnosis not present

## 2016-03-29 DIAGNOSIS — E1165 Type 2 diabetes mellitus with hyperglycemia: Secondary | ICD-10-CM | POA: Diagnosis not present

## 2016-03-29 DIAGNOSIS — N3941 Urge incontinence: Secondary | ICD-10-CM | POA: Diagnosis not present

## 2016-04-05 DIAGNOSIS — E1165 Type 2 diabetes mellitus with hyperglycemia: Secondary | ICD-10-CM | POA: Diagnosis not present

## 2016-04-05 DIAGNOSIS — Z23 Encounter for immunization: Secondary | ICD-10-CM | POA: Diagnosis not present

## 2016-04-10 DIAGNOSIS — J209 Acute bronchitis, unspecified: Secondary | ICD-10-CM | POA: Diagnosis not present

## 2016-04-16 ENCOUNTER — Other Ambulatory Visit: Payer: Self-pay | Admitting: Family Medicine

## 2016-04-16 DIAGNOSIS — Z1231 Encounter for screening mammogram for malignant neoplasm of breast: Secondary | ICD-10-CM

## 2016-04-24 ENCOUNTER — Ambulatory Visit: Payer: Federal, State, Local not specified - PPO

## 2016-04-27 ENCOUNTER — Ambulatory Visit
Admission: RE | Admit: 2016-04-27 | Discharge: 2016-04-27 | Disposition: A | Discharge status: Home / Self Care | Payer: Federal, State, Local not specified - PPO | Source: Ambulatory Visit | Attending: Family Medicine | Admitting: Family Medicine

## 2016-04-27 DIAGNOSIS — Z1231 Encounter for screening mammogram for malignant neoplasm of breast: Secondary | ICD-10-CM | POA: Diagnosis not present

## 2016-08-10 DIAGNOSIS — E1165 Type 2 diabetes mellitus with hyperglycemia: Secondary | ICD-10-CM | POA: Diagnosis not present

## 2016-08-17 DIAGNOSIS — J329 Chronic sinusitis, unspecified: Secondary | ICD-10-CM | POA: Diagnosis not present

## 2016-08-17 DIAGNOSIS — E1165 Type 2 diabetes mellitus with hyperglycemia: Secondary | ICD-10-CM | POA: Diagnosis not present

## 2016-08-24 DIAGNOSIS — K080 Exfoliation of teeth due to systemic causes: Secondary | ICD-10-CM | POA: Diagnosis not present

## 2016-09-11 DIAGNOSIS — M79672 Pain in left foot: Secondary | ICD-10-CM | POA: Diagnosis not present

## 2016-09-27 DIAGNOSIS — R768 Other specified abnormal immunological findings in serum: Secondary | ICD-10-CM | POA: Diagnosis not present

## 2016-09-27 DIAGNOSIS — M2550 Pain in unspecified joint: Secondary | ICD-10-CM | POA: Diagnosis not present

## 2016-09-27 DIAGNOSIS — M7989 Other specified soft tissue disorders: Secondary | ICD-10-CM | POA: Diagnosis not present

## 2016-09-27 DIAGNOSIS — M150 Primary generalized (osteo)arthritis: Secondary | ICD-10-CM | POA: Diagnosis not present

## 2016-10-11 DIAGNOSIS — R768 Other specified abnormal immunological findings in serum: Secondary | ICD-10-CM | POA: Diagnosis not present

## 2016-10-11 DIAGNOSIS — M0579 Rheumatoid arthritis with rheumatoid factor of multiple sites without organ or systems involvement: Secondary | ICD-10-CM | POA: Diagnosis not present

## 2016-10-11 DIAGNOSIS — M150 Primary generalized (osteo)arthritis: Secondary | ICD-10-CM | POA: Diagnosis not present

## 2016-10-11 DIAGNOSIS — M2550 Pain in unspecified joint: Secondary | ICD-10-CM | POA: Diagnosis not present

## 2016-11-30 DIAGNOSIS — Z79899 Other long term (current) drug therapy: Secondary | ICD-10-CM | POA: Diagnosis not present

## 2016-11-30 DIAGNOSIS — E785 Hyperlipidemia, unspecified: Secondary | ICD-10-CM | POA: Diagnosis not present

## 2016-11-30 DIAGNOSIS — E1165 Type 2 diabetes mellitus with hyperglycemia: Secondary | ICD-10-CM | POA: Diagnosis not present

## 2016-12-06 DIAGNOSIS — M2550 Pain in unspecified joint: Secondary | ICD-10-CM | POA: Diagnosis not present

## 2016-12-06 DIAGNOSIS — Z6831 Body mass index (BMI) 31.0-31.9, adult: Secondary | ICD-10-CM | POA: Diagnosis not present

## 2016-12-06 DIAGNOSIS — M0579 Rheumatoid arthritis with rheumatoid factor of multiple sites without organ or systems involvement: Secondary | ICD-10-CM | POA: Diagnosis not present

## 2016-12-06 DIAGNOSIS — E1129 Type 2 diabetes mellitus with other diabetic kidney complication: Secondary | ICD-10-CM | POA: Diagnosis not present

## 2016-12-06 DIAGNOSIS — M150 Primary generalized (osteo)arthritis: Secondary | ICD-10-CM | POA: Diagnosis not present

## 2016-12-06 DIAGNOSIS — R768 Other specified abnormal immunological findings in serum: Secondary | ICD-10-CM | POA: Diagnosis not present

## 2017-01-24 DIAGNOSIS — M2550 Pain in unspecified joint: Secondary | ICD-10-CM | POA: Diagnosis not present

## 2017-02-01 DIAGNOSIS — M0579 Rheumatoid arthritis with rheumatoid factor of multiple sites without organ or systems involvement: Secondary | ICD-10-CM | POA: Diagnosis not present

## 2017-02-01 DIAGNOSIS — M2550 Pain in unspecified joint: Secondary | ICD-10-CM | POA: Diagnosis not present

## 2017-02-11 DIAGNOSIS — M0579 Rheumatoid arthritis with rheumatoid factor of multiple sites without organ or systems involvement: Secondary | ICD-10-CM | POA: Diagnosis not present

## 2017-03-01 DIAGNOSIS — K080 Exfoliation of teeth due to systemic causes: Secondary | ICD-10-CM | POA: Diagnosis not present

## 2017-04-04 DIAGNOSIS — J00 Acute nasopharyngitis [common cold]: Secondary | ICD-10-CM | POA: Diagnosis not present

## 2017-04-04 DIAGNOSIS — M0579 Rheumatoid arthritis with rheumatoid factor of multiple sites without organ or systems involvement: Secondary | ICD-10-CM | POA: Diagnosis not present

## 2017-04-04 DIAGNOSIS — R5383 Other fatigue: Secondary | ICD-10-CM | POA: Diagnosis not present

## 2017-04-04 DIAGNOSIS — Z6831 Body mass index (BMI) 31.0-31.9, adult: Secondary | ICD-10-CM | POA: Diagnosis not present

## 2017-04-04 DIAGNOSIS — M2550 Pain in unspecified joint: Secondary | ICD-10-CM | POA: Diagnosis not present

## 2017-04-26 DIAGNOSIS — Z6832 Body mass index (BMI) 32.0-32.9, adult: Secondary | ICD-10-CM | POA: Diagnosis not present

## 2017-04-26 DIAGNOSIS — R6889 Other general symptoms and signs: Secondary | ICD-10-CM | POA: Diagnosis not present

## 2017-04-26 DIAGNOSIS — Z23 Encounter for immunization: Secondary | ICD-10-CM | POA: Diagnosis not present

## 2017-04-26 DIAGNOSIS — R748 Abnormal levels of other serum enzymes: Secondary | ICD-10-CM | POA: Diagnosis not present

## 2017-04-26 DIAGNOSIS — R7989 Other specified abnormal findings of blood chemistry: Secondary | ICD-10-CM | POA: Diagnosis not present

## 2017-04-26 DIAGNOSIS — Z1339 Encounter for screening examination for other mental health and behavioral disorders: Secondary | ICD-10-CM | POA: Diagnosis not present

## 2017-05-27 DIAGNOSIS — Z6832 Body mass index (BMI) 32.0-32.9, adult: Secondary | ICD-10-CM | POA: Diagnosis not present

## 2017-05-27 DIAGNOSIS — F419 Anxiety disorder, unspecified: Secondary | ICD-10-CM | POA: Diagnosis not present

## 2017-05-27 DIAGNOSIS — I10 Essential (primary) hypertension: Secondary | ICD-10-CM | POA: Diagnosis not present

## 2017-07-25 DIAGNOSIS — M2550 Pain in unspecified joint: Secondary | ICD-10-CM | POA: Diagnosis not present

## 2017-07-25 DIAGNOSIS — M0579 Rheumatoid arthritis with rheumatoid factor of multiple sites without organ or systems involvement: Secondary | ICD-10-CM | POA: Diagnosis not present

## 2017-07-25 DIAGNOSIS — M546 Pain in thoracic spine: Secondary | ICD-10-CM | POA: Diagnosis not present

## 2017-09-21 DIAGNOSIS — E1165 Type 2 diabetes mellitus with hyperglycemia: Secondary | ICD-10-CM | POA: Diagnosis not present

## 2017-09-23 DIAGNOSIS — J111 Influenza due to unidentified influenza virus with other respiratory manifestations: Secondary | ICD-10-CM | POA: Diagnosis not present

## 2017-09-23 DIAGNOSIS — F17200 Nicotine dependence, unspecified, uncomplicated: Secondary | ICD-10-CM | POA: Diagnosis not present

## 2017-09-23 DIAGNOSIS — Z6831 Body mass index (BMI) 31.0-31.9, adult: Secondary | ICD-10-CM | POA: Diagnosis not present

## 2017-09-27 DIAGNOSIS — K080 Exfoliation of teeth due to systemic causes: Secondary | ICD-10-CM | POA: Diagnosis not present

## 2017-10-18 DIAGNOSIS — E1165 Type 2 diabetes mellitus with hyperglycemia: Secondary | ICD-10-CM | POA: Diagnosis not present

## 2017-10-23 DIAGNOSIS — M2550 Pain in unspecified joint: Secondary | ICD-10-CM | POA: Diagnosis not present

## 2017-10-23 DIAGNOSIS — E1165 Type 2 diabetes mellitus with hyperglycemia: Secondary | ICD-10-CM | POA: Diagnosis not present

## 2017-10-23 DIAGNOSIS — Z6830 Body mass index (BMI) 30.0-30.9, adult: Secondary | ICD-10-CM | POA: Diagnosis not present

## 2017-10-23 DIAGNOSIS — M0579 Rheumatoid arthritis with rheumatoid factor of multiple sites without organ or systems involvement: Secondary | ICD-10-CM | POA: Diagnosis not present

## 2017-10-23 DIAGNOSIS — M546 Pain in thoracic spine: Secondary | ICD-10-CM | POA: Diagnosis not present

## 2017-10-23 DIAGNOSIS — M25559 Pain in unspecified hip: Secondary | ICD-10-CM | POA: Diagnosis not present

## 2017-10-30 DIAGNOSIS — M25552 Pain in left hip: Secondary | ICD-10-CM | POA: Diagnosis not present

## 2017-10-30 DIAGNOSIS — M25551 Pain in right hip: Secondary | ICD-10-CM | POA: Diagnosis not present

## 2017-12-16 DIAGNOSIS — F419 Anxiety disorder, unspecified: Secondary | ICD-10-CM | POA: Diagnosis not present

## 2017-12-16 DIAGNOSIS — M069 Rheumatoid arthritis, unspecified: Secondary | ICD-10-CM | POA: Diagnosis not present

## 2017-12-16 DIAGNOSIS — Z6830 Body mass index (BMI) 30.0-30.9, adult: Secondary | ICD-10-CM | POA: Diagnosis not present

## 2018-01-30 DIAGNOSIS — M2550 Pain in unspecified joint: Secondary | ICD-10-CM | POA: Diagnosis not present

## 2018-01-30 DIAGNOSIS — M25559 Pain in unspecified hip: Secondary | ICD-10-CM | POA: Diagnosis not present

## 2018-01-30 DIAGNOSIS — M546 Pain in thoracic spine: Secondary | ICD-10-CM | POA: Diagnosis not present

## 2018-01-30 DIAGNOSIS — M0579 Rheumatoid arthritis with rheumatoid factor of multiple sites without organ or systems involvement: Secondary | ICD-10-CM | POA: Diagnosis not present

## 2018-02-28 DIAGNOSIS — R946 Abnormal results of thyroid function studies: Secondary | ICD-10-CM | POA: Diagnosis not present

## 2018-02-28 DIAGNOSIS — E1165 Type 2 diabetes mellitus with hyperglycemia: Secondary | ICD-10-CM | POA: Diagnosis not present

## 2018-02-28 DIAGNOSIS — E785 Hyperlipidemia, unspecified: Secondary | ICD-10-CM | POA: Diagnosis not present

## 2018-03-05 DIAGNOSIS — I10 Essential (primary) hypertension: Secondary | ICD-10-CM | POA: Diagnosis not present

## 2018-03-05 DIAGNOSIS — E7800 Pure hypercholesterolemia, unspecified: Secondary | ICD-10-CM | POA: Diagnosis not present

## 2018-03-05 DIAGNOSIS — E1165 Type 2 diabetes mellitus with hyperglycemia: Secondary | ICD-10-CM | POA: Diagnosis not present

## 2018-03-05 DIAGNOSIS — Z6831 Body mass index (BMI) 31.0-31.9, adult: Secondary | ICD-10-CM | POA: Diagnosis not present

## 2018-04-17 DIAGNOSIS — R6883 Chills (without fever): Secondary | ICD-10-CM | POA: Diagnosis not present

## 2018-04-17 DIAGNOSIS — E1165 Type 2 diabetes mellitus with hyperglycemia: Secondary | ICD-10-CM | POA: Diagnosis not present

## 2018-04-17 DIAGNOSIS — Z6830 Body mass index (BMI) 30.0-30.9, adult: Secondary | ICD-10-CM | POA: Diagnosis not present

## 2018-05-02 DIAGNOSIS — M0579 Rheumatoid arthritis with rheumatoid factor of multiple sites without organ or systems involvement: Secondary | ICD-10-CM | POA: Diagnosis not present

## 2018-05-02 DIAGNOSIS — M546 Pain in thoracic spine: Secondary | ICD-10-CM | POA: Diagnosis not present

## 2018-05-02 DIAGNOSIS — M2550 Pain in unspecified joint: Secondary | ICD-10-CM | POA: Diagnosis not present

## 2018-05-23 DIAGNOSIS — Z23 Encounter for immunization: Secondary | ICD-10-CM | POA: Diagnosis not present

## 2018-05-23 DIAGNOSIS — Z6830 Body mass index (BMI) 30.0-30.9, adult: Secondary | ICD-10-CM | POA: Diagnosis not present

## 2018-05-23 DIAGNOSIS — Z1331 Encounter for screening for depression: Secondary | ICD-10-CM | POA: Diagnosis not present

## 2018-05-23 DIAGNOSIS — K529 Noninfective gastroenteritis and colitis, unspecified: Secondary | ICD-10-CM | POA: Diagnosis not present

## 2018-05-24 DIAGNOSIS — K529 Noninfective gastroenteritis and colitis, unspecified: Secondary | ICD-10-CM | POA: Diagnosis not present

## 2018-06-05 DIAGNOSIS — H8110 Benign paroxysmal vertigo, unspecified ear: Secondary | ICD-10-CM | POA: Diagnosis not present

## 2018-06-05 DIAGNOSIS — I10 Essential (primary) hypertension: Secondary | ICD-10-CM | POA: Diagnosis not present

## 2018-06-05 DIAGNOSIS — E1165 Type 2 diabetes mellitus with hyperglycemia: Secondary | ICD-10-CM | POA: Diagnosis not present

## 2018-06-05 DIAGNOSIS — G629 Polyneuropathy, unspecified: Secondary | ICD-10-CM | POA: Diagnosis not present

## 2018-06-05 DIAGNOSIS — Z6827 Body mass index (BMI) 27.0-27.9, adult: Secondary | ICD-10-CM | POA: Diagnosis not present

## 2018-06-10 DIAGNOSIS — R29703 NIHSS score 3: Secondary | ICD-10-CM | POA: Diagnosis not present

## 2018-06-10 DIAGNOSIS — I10 Essential (primary) hypertension: Secondary | ICD-10-CM | POA: Diagnosis not present

## 2018-06-10 DIAGNOSIS — I252 Old myocardial infarction: Secondary | ICD-10-CM | POA: Diagnosis not present

## 2018-06-10 DIAGNOSIS — E119 Type 2 diabetes mellitus without complications: Secondary | ICD-10-CM | POA: Diagnosis not present

## 2018-06-10 DIAGNOSIS — Z7982 Long term (current) use of aspirin: Secondary | ICD-10-CM | POA: Diagnosis not present

## 2018-06-10 DIAGNOSIS — R42 Dizziness and giddiness: Secondary | ICD-10-CM | POA: Diagnosis not present

## 2018-06-10 DIAGNOSIS — R112 Nausea with vomiting, unspecified: Secondary | ICD-10-CM | POA: Diagnosis not present

## 2018-06-10 DIAGNOSIS — R4781 Slurred speech: Secondary | ICD-10-CM | POA: Diagnosis not present

## 2018-06-10 DIAGNOSIS — E7800 Pure hypercholesterolemia, unspecified: Secondary | ICD-10-CM | POA: Diagnosis not present

## 2018-06-10 DIAGNOSIS — R270 Ataxia, unspecified: Secondary | ICD-10-CM | POA: Diagnosis not present

## 2018-06-10 DIAGNOSIS — Z79899 Other long term (current) drug therapy: Secondary | ICD-10-CM | POA: Diagnosis not present

## 2018-06-10 DIAGNOSIS — Z794 Long term (current) use of insulin: Secondary | ICD-10-CM | POA: Diagnosis not present

## 2018-06-16 DIAGNOSIS — R52 Pain, unspecified: Secondary | ICD-10-CM | POA: Diagnosis not present

## 2018-06-16 DIAGNOSIS — R404 Transient alteration of awareness: Secondary | ICD-10-CM | POA: Diagnosis not present

## 2018-06-16 DIAGNOSIS — D320 Benign neoplasm of cerebral meninges: Secondary | ICD-10-CM | POA: Diagnosis not present

## 2018-06-16 DIAGNOSIS — D329 Benign neoplasm of meninges, unspecified: Secondary | ICD-10-CM | POA: Diagnosis not present

## 2018-06-16 DIAGNOSIS — S0003XA Contusion of scalp, initial encounter: Secondary | ICD-10-CM | POA: Diagnosis not present

## 2018-06-16 DIAGNOSIS — Y999 Unspecified external cause status: Secondary | ICD-10-CM | POA: Diagnosis not present

## 2018-06-16 DIAGNOSIS — W1839XA Other fall on same level, initial encounter: Secondary | ICD-10-CM | POA: Diagnosis not present

## 2018-06-16 DIAGNOSIS — R42 Dizziness and giddiness: Secondary | ICD-10-CM | POA: Diagnosis not present

## 2018-06-16 DIAGNOSIS — R110 Nausea: Secondary | ICD-10-CM | POA: Diagnosis not present

## 2018-06-16 DIAGNOSIS — R55 Syncope and collapse: Secondary | ICD-10-CM | POA: Diagnosis not present

## 2018-06-20 DIAGNOSIS — D420 Neoplasm of uncertain behavior of cerebral meninges: Secondary | ICD-10-CM | POA: Diagnosis not present

## 2018-06-23 DIAGNOSIS — E1165 Type 2 diabetes mellitus with hyperglycemia: Secondary | ICD-10-CM | POA: Diagnosis not present

## 2018-06-23 DIAGNOSIS — Z6830 Body mass index (BMI) 30.0-30.9, adult: Secondary | ICD-10-CM | POA: Diagnosis not present

## 2018-06-23 DIAGNOSIS — H8309 Labyrinthitis, unspecified ear: Secondary | ICD-10-CM | POA: Diagnosis not present

## 2018-06-23 DIAGNOSIS — F419 Anxiety disorder, unspecified: Secondary | ICD-10-CM | POA: Diagnosis not present

## 2018-07-02 DIAGNOSIS — H9313 Tinnitus, bilateral: Secondary | ICD-10-CM | POA: Diagnosis not present

## 2018-07-02 DIAGNOSIS — R42 Dizziness and giddiness: Secondary | ICD-10-CM | POA: Diagnosis not present

## 2018-07-02 DIAGNOSIS — H903 Sensorineural hearing loss, bilateral: Secondary | ICD-10-CM | POA: Diagnosis not present

## 2018-07-30 DIAGNOSIS — Z6829 Body mass index (BMI) 29.0-29.9, adult: Secondary | ICD-10-CM | POA: Diagnosis not present

## 2018-07-30 DIAGNOSIS — E1165 Type 2 diabetes mellitus with hyperglycemia: Secondary | ICD-10-CM | POA: Diagnosis not present

## 2018-07-30 DIAGNOSIS — E7800 Pure hypercholesterolemia, unspecified: Secondary | ICD-10-CM | POA: Diagnosis not present

## 2018-07-30 DIAGNOSIS — I10 Essential (primary) hypertension: Secondary | ICD-10-CM | POA: Diagnosis not present

## 2018-08-06 DIAGNOSIS — M25559 Pain in unspecified hip: Secondary | ICD-10-CM | POA: Diagnosis not present

## 2018-08-06 DIAGNOSIS — M546 Pain in thoracic spine: Secondary | ICD-10-CM | POA: Diagnosis not present

## 2018-08-06 DIAGNOSIS — M0579 Rheumatoid arthritis with rheumatoid factor of multiple sites without organ or systems involvement: Secondary | ICD-10-CM | POA: Diagnosis not present

## 2018-08-06 DIAGNOSIS — M2550 Pain in unspecified joint: Secondary | ICD-10-CM | POA: Diagnosis not present

## 2018-08-28 DIAGNOSIS — M0579 Rheumatoid arthritis with rheumatoid factor of multiple sites without organ or systems involvement: Secondary | ICD-10-CM | POA: Diagnosis not present

## 2018-09-30 DIAGNOSIS — Z6829 Body mass index (BMI) 29.0-29.9, adult: Secondary | ICD-10-CM | POA: Diagnosis not present

## 2018-09-30 DIAGNOSIS — J069 Acute upper respiratory infection, unspecified: Secondary | ICD-10-CM | POA: Diagnosis not present

## 2018-10-04 DIAGNOSIS — S90851A Superficial foreign body, right foot, initial encounter: Secondary | ICD-10-CM | POA: Diagnosis not present

## 2018-10-04 DIAGNOSIS — W460XXA Contact with hypodermic needle, initial encounter: Secondary | ICD-10-CM | POA: Diagnosis not present

## 2018-10-04 DIAGNOSIS — S91341A Puncture wound with foreign body, right foot, initial encounter: Secondary | ICD-10-CM | POA: Diagnosis not present

## 2018-10-06 DIAGNOSIS — S90851A Superficial foreign body, right foot, initial encounter: Secondary | ICD-10-CM | POA: Insufficient documentation

## 2018-10-08 DIAGNOSIS — Z794 Long term (current) use of insulin: Secondary | ICD-10-CM | POA: Diagnosis not present

## 2018-10-08 DIAGNOSIS — Z79899 Other long term (current) drug therapy: Secondary | ICD-10-CM | POA: Diagnosis not present

## 2018-10-08 DIAGNOSIS — W460XXD Contact with hypodermic needle, subsequent encounter: Secondary | ICD-10-CM | POA: Diagnosis not present

## 2018-10-08 DIAGNOSIS — I10 Essential (primary) hypertension: Secondary | ICD-10-CM | POA: Diagnosis not present

## 2018-10-08 DIAGNOSIS — E7800 Pure hypercholesterolemia, unspecified: Secondary | ICD-10-CM | POA: Diagnosis not present

## 2018-10-08 DIAGNOSIS — S90851D Superficial foreign body, right foot, subsequent encounter: Secondary | ICD-10-CM | POA: Diagnosis not present

## 2018-10-08 DIAGNOSIS — S91341A Puncture wound with foreign body, right foot, initial encounter: Secondary | ICD-10-CM | POA: Diagnosis not present

## 2018-10-08 DIAGNOSIS — Z7984 Long term (current) use of oral hypoglycemic drugs: Secondary | ICD-10-CM | POA: Diagnosis not present

## 2018-10-08 DIAGNOSIS — F17210 Nicotine dependence, cigarettes, uncomplicated: Secondary | ICD-10-CM | POA: Diagnosis not present

## 2018-10-08 DIAGNOSIS — E119 Type 2 diabetes mellitus without complications: Secondary | ICD-10-CM | POA: Diagnosis not present

## 2018-10-08 DIAGNOSIS — J45909 Unspecified asthma, uncomplicated: Secondary | ICD-10-CM | POA: Diagnosis not present

## 2018-10-21 DIAGNOSIS — E1165 Type 2 diabetes mellitus with hyperglycemia: Secondary | ICD-10-CM | POA: Diagnosis not present

## 2018-10-21 DIAGNOSIS — S90851D Superficial foreign body, right foot, subsequent encounter: Secondary | ICD-10-CM | POA: Diagnosis not present

## 2018-10-30 DIAGNOSIS — Z6829 Body mass index (BMI) 29.0-29.9, adult: Secondary | ICD-10-CM | POA: Diagnosis not present

## 2018-10-30 DIAGNOSIS — E1159 Type 2 diabetes mellitus with other circulatory complications: Secondary | ICD-10-CM | POA: Diagnosis not present

## 2018-10-30 DIAGNOSIS — E7800 Pure hypercholesterolemia, unspecified: Secondary | ICD-10-CM | POA: Diagnosis not present

## 2018-10-30 DIAGNOSIS — E1165 Type 2 diabetes mellitus with hyperglycemia: Secondary | ICD-10-CM | POA: Diagnosis not present

## 2018-11-06 DIAGNOSIS — M0579 Rheumatoid arthritis with rheumatoid factor of multiple sites without organ or systems involvement: Secondary | ICD-10-CM | POA: Diagnosis not present

## 2018-11-06 DIAGNOSIS — M25559 Pain in unspecified hip: Secondary | ICD-10-CM | POA: Diagnosis not present

## 2018-11-06 DIAGNOSIS — M546 Pain in thoracic spine: Secondary | ICD-10-CM | POA: Diagnosis not present

## 2018-11-06 DIAGNOSIS — M2550 Pain in unspecified joint: Secondary | ICD-10-CM | POA: Diagnosis not present

## 2018-11-12 DIAGNOSIS — M0579 Rheumatoid arthritis with rheumatoid factor of multiple sites without organ or systems involvement: Secondary | ICD-10-CM | POA: Diagnosis not present

## 2018-11-20 DIAGNOSIS — E1165 Type 2 diabetes mellitus with hyperglycemia: Secondary | ICD-10-CM | POA: Diagnosis not present

## 2018-11-20 DIAGNOSIS — B373 Candidiasis of vulva and vagina: Secondary | ICD-10-CM | POA: Diagnosis not present

## 2018-11-20 DIAGNOSIS — Z6828 Body mass index (BMI) 28.0-28.9, adult: Secondary | ICD-10-CM | POA: Diagnosis not present

## 2018-12-04 DIAGNOSIS — L03115 Cellulitis of right lower limb: Secondary | ICD-10-CM | POA: Diagnosis not present

## 2018-12-27 DIAGNOSIS — S7012XA Contusion of left thigh, initial encounter: Secondary | ICD-10-CM | POA: Diagnosis not present

## 2019-01-13 DIAGNOSIS — M2550 Pain in unspecified joint: Secondary | ICD-10-CM | POA: Diagnosis not present

## 2019-01-13 DIAGNOSIS — M79675 Pain in left toe(s): Secondary | ICD-10-CM | POA: Diagnosis not present

## 2019-01-13 DIAGNOSIS — M0579 Rheumatoid arthritis with rheumatoid factor of multiple sites without organ or systems involvement: Secondary | ICD-10-CM | POA: Diagnosis not present

## 2019-01-13 DIAGNOSIS — M546 Pain in thoracic spine: Secondary | ICD-10-CM | POA: Diagnosis not present

## 2019-01-13 DIAGNOSIS — M25559 Pain in unspecified hip: Secondary | ICD-10-CM | POA: Diagnosis not present

## 2019-01-20 DIAGNOSIS — D519 Vitamin B12 deficiency anemia, unspecified: Secondary | ICD-10-CM | POA: Diagnosis not present

## 2019-01-20 DIAGNOSIS — Z6828 Body mass index (BMI) 28.0-28.9, adult: Secondary | ICD-10-CM | POA: Diagnosis not present

## 2019-01-20 DIAGNOSIS — D510 Vitamin B12 deficiency anemia due to intrinsic factor deficiency: Secondary | ICD-10-CM | POA: Diagnosis not present

## 2019-01-20 DIAGNOSIS — E7800 Pure hypercholesterolemia, unspecified: Secondary | ICD-10-CM | POA: Diagnosis not present

## 2019-01-20 DIAGNOSIS — E1165 Type 2 diabetes mellitus with hyperglycemia: Secondary | ICD-10-CM | POA: Diagnosis not present

## 2019-01-21 DIAGNOSIS — R7989 Other specified abnormal findings of blood chemistry: Secondary | ICD-10-CM | POA: Diagnosis not present

## 2019-01-28 DIAGNOSIS — L03032 Cellulitis of left toe: Secondary | ICD-10-CM | POA: Diagnosis not present

## 2019-01-28 DIAGNOSIS — L600 Ingrowing nail: Secondary | ICD-10-CM | POA: Diagnosis not present

## 2019-01-28 DIAGNOSIS — E119 Type 2 diabetes mellitus without complications: Secondary | ICD-10-CM | POA: Diagnosis not present

## 2019-02-18 DIAGNOSIS — M7062 Trochanteric bursitis, left hip: Secondary | ICD-10-CM | POA: Diagnosis not present

## 2019-02-18 DIAGNOSIS — M2550 Pain in unspecified joint: Secondary | ICD-10-CM | POA: Diagnosis not present

## 2019-02-18 DIAGNOSIS — M79675 Pain in left toe(s): Secondary | ICD-10-CM | POA: Diagnosis not present

## 2019-02-18 DIAGNOSIS — M0579 Rheumatoid arthritis with rheumatoid factor of multiple sites without organ or systems involvement: Secondary | ICD-10-CM | POA: Diagnosis not present

## 2019-03-04 DIAGNOSIS — M0579 Rheumatoid arthritis with rheumatoid factor of multiple sites without organ or systems involvement: Secondary | ICD-10-CM | POA: Diagnosis not present

## 2019-03-04 DIAGNOSIS — M79675 Pain in left toe(s): Secondary | ICD-10-CM | POA: Diagnosis not present

## 2019-03-04 DIAGNOSIS — M7062 Trochanteric bursitis, left hip: Secondary | ICD-10-CM | POA: Diagnosis not present

## 2019-03-04 DIAGNOSIS — M2550 Pain in unspecified joint: Secondary | ICD-10-CM | POA: Diagnosis not present

## 2019-03-08 DIAGNOSIS — I739 Peripheral vascular disease, unspecified: Secondary | ICD-10-CM | POA: Diagnosis not present

## 2019-03-09 DIAGNOSIS — Z6828 Body mass index (BMI) 28.0-28.9, adult: Secondary | ICD-10-CM | POA: Diagnosis not present

## 2019-03-09 DIAGNOSIS — I739 Peripheral vascular disease, unspecified: Secondary | ICD-10-CM | POA: Diagnosis not present

## 2019-03-09 DIAGNOSIS — M79675 Pain in left toe(s): Secondary | ICD-10-CM | POA: Diagnosis not present

## 2019-03-10 DIAGNOSIS — M7062 Trochanteric bursitis, left hip: Secondary | ICD-10-CM | POA: Diagnosis not present

## 2019-03-11 ENCOUNTER — Emergency Department (HOSPITAL_COMMUNITY)
Admission: EM | Admit: 2019-03-11 | Discharge: 2019-03-12 | Disposition: A | Discharge status: Home / Self Care | Payer: Federal, State, Local not specified - PPO | Attending: Emergency Medicine | Admitting: Emergency Medicine

## 2019-03-11 ENCOUNTER — Other Ambulatory Visit: Payer: Self-pay

## 2019-03-11 ENCOUNTER — Encounter (HOSPITAL_COMMUNITY): Payer: Self-pay | Admitting: Emergency Medicine

## 2019-03-11 DIAGNOSIS — I10 Essential (primary) hypertension: Secondary | ICD-10-CM | POA: Insufficient documentation

## 2019-03-11 DIAGNOSIS — Z794 Long term (current) use of insulin: Secondary | ICD-10-CM | POA: Diagnosis not present

## 2019-03-11 DIAGNOSIS — F17210 Nicotine dependence, cigarettes, uncomplicated: Secondary | ICD-10-CM | POA: Insufficient documentation

## 2019-03-11 DIAGNOSIS — M79605 Pain in left leg: Secondary | ICD-10-CM

## 2019-03-11 DIAGNOSIS — E119 Type 2 diabetes mellitus without complications: Secondary | ICD-10-CM | POA: Insufficient documentation

## 2019-03-11 DIAGNOSIS — Z20828 Contact with and (suspected) exposure to other viral communicable diseases: Secondary | ICD-10-CM | POA: Diagnosis not present

## 2019-03-11 DIAGNOSIS — M79662 Pain in left lower leg: Secondary | ICD-10-CM | POA: Diagnosis not present

## 2019-03-11 DIAGNOSIS — Z7982 Long term (current) use of aspirin: Secondary | ICD-10-CM | POA: Diagnosis not present

## 2019-03-11 DIAGNOSIS — J45909 Unspecified asthma, uncomplicated: Secondary | ICD-10-CM | POA: Insufficient documentation

## 2019-03-11 NOTE — ED Triage Notes (Signed)
Patient complaining of left foot pain. Patient states she thinks she has a blood clot but do not know. Patient states she is waiting on appointment for vascular. Patient states that she can not wait on vascular.

## 2019-03-12 ENCOUNTER — Encounter: Payer: Self-pay | Admitting: *Deleted

## 2019-03-12 ENCOUNTER — Encounter: Payer: Self-pay | Admitting: Vascular Surgery

## 2019-03-12 ENCOUNTER — Other Ambulatory Visit (HOSPITAL_COMMUNITY)
Admission: RE | Admit: 2019-03-12 | Discharge: 2019-03-12 | Disposition: A | Discharge status: Home / Self Care | Payer: Federal, State, Local not specified - PPO | Source: Ambulatory Visit | Attending: Vascular Surgery | Admitting: Vascular Surgery

## 2019-03-12 ENCOUNTER — Other Ambulatory Visit: Payer: Self-pay | Admitting: Vascular Surgery

## 2019-03-12 ENCOUNTER — Ambulatory Visit: Payer: Federal, State, Local not specified - PPO | Admitting: Vascular Surgery

## 2019-03-12 ENCOUNTER — Other Ambulatory Visit: Payer: Self-pay

## 2019-03-12 ENCOUNTER — Other Ambulatory Visit: Payer: Self-pay | Admitting: *Deleted

## 2019-03-12 VITALS — BP 127/78 | HR 73 | Resp 20 | Ht 65.0 in | Wt 169.0 lb

## 2019-03-12 DIAGNOSIS — I739 Peripheral vascular disease, unspecified: Secondary | ICD-10-CM

## 2019-03-12 DIAGNOSIS — Z8489 Family history of other specified conditions: Secondary | ICD-10-CM | POA: Insufficient documentation

## 2019-03-12 DIAGNOSIS — D496 Neoplasm of unspecified behavior of brain: Secondary | ICD-10-CM | POA: Insufficient documentation

## 2019-03-12 DIAGNOSIS — I10 Essential (primary) hypertension: Secondary | ICD-10-CM | POA: Insufficient documentation

## 2019-03-12 DIAGNOSIS — J45909 Unspecified asthma, uncomplicated: Secondary | ICD-10-CM | POA: Insufficient documentation

## 2019-03-12 DIAGNOSIS — K219 Gastro-esophageal reflux disease without esophagitis: Secondary | ICD-10-CM | POA: Insufficient documentation

## 2019-03-12 DIAGNOSIS — E0500 Thyrotoxicosis with diffuse goiter without thyrotoxic crisis or storm: Secondary | ICD-10-CM | POA: Insufficient documentation

## 2019-03-12 DIAGNOSIS — E119 Type 2 diabetes mellitus without complications: Secondary | ICD-10-CM | POA: Insufficient documentation

## 2019-03-12 DIAGNOSIS — M1990 Unspecified osteoarthritis, unspecified site: Secondary | ICD-10-CM | POA: Insufficient documentation

## 2019-03-12 DIAGNOSIS — IMO0001 Reserved for inherently not codable concepts without codable children: Secondary | ICD-10-CM | POA: Insufficient documentation

## 2019-03-12 LAB — SARS CORONAVIRUS 2 (TAT 6-24 HRS): SARS Coronavirus 2: NEGATIVE

## 2019-03-12 MED ORDER — HYDROCODONE-ACETAMINOPHEN 5-325 MG PO TABS: 1.0000 | ORAL_TABLET | ORAL | 0 refills | Status: DC | PRN

## 2019-03-12 MED ORDER — HYDROCODONE-ACETAMINOPHEN 5-325 MG PO TABS
1.0000 | ORAL_TABLET | Freq: Once | ORAL | Status: AC
Start: 2019-03-12 — End: 2019-03-12
  Administered 2019-03-12: 1 via ORAL
  Filled 2019-03-12: qty 1

## 2019-03-12 NOTE — Progress Notes (Signed)
Referring Physician: Dr Lin Landsman  Patient name: Karen Wu MRN: [id number] DOB: 26-Apr-1966 Sex: female  REASON FOR CONSULT: Left first toe pain HPI: Karen Wu is a 53 y.o. female, with about a 7-week history of pain in her left first toe.  States initially it began to burn and turned red.  She was seen by a podiatrist in [place] and told that she had an ingrown toenail.  A drainage procedure was performed.  This failed to heal.  The patient has continued to have pain.  She has been seen recently at urgent care and then at the [facility] long ER yesterday for pain symptoms in that left first toe.  She does not describe claudication symptoms.  She does currently smoke 2 packs of cigarettes per day.  She also has diabetes.  She has had a previous left ankle operation several years ago.  She also describes a bandlike tightness from the knee extending down to the left foot.  She has not had any prior abdominal operations other than an umbilical hernia repair.  She is not currently on aspirin.  She is on a statin.  Other medical problems include arthritis, reflux, hypertension all of which are currently stable.  Past Medical History:  Diagnosis Date  . Arthritis    hips, knees, lower back  . Asthma    rare inhaler use  . Brain tumor ([place])    x 2 - appear to be meningiomas, per neurosurgeon's report  . Dental crown present   . Family history of adverse reaction to anesthesia    pt's father woke up during surgery; pt's mother has hx. of being hard to wake up post-op  . GERD (gastroesophageal reflux disease)    OTC as needed  . Graves' disease    no current med.  Marland Kitchen Heart murmur    as a child, states no problems  . History of anemia    no problems since endometrial ablation  . Hypertension    under control with meds., per pt.; has been on med. x 21 yr.  . Insulin dependent diabetes mellitus ([place])   . Trigger finger of right hand 10/2014   thumb and long finger   Past Surgical History:   Procedure Laterality Date  . BLADDER SUSPENSION  01/2014  . BREAST MASS EXCISION Left 05/30/2005  . ENDOMETRIAL ABLATION  01/2014  . FOOT SURGERY Left    exc. cyst and bone fragments  . MICROLARYNGOSCOPY WITH CO2 LASER AND EXCISION OF VOCAL CORD LESION  03/2014  . SHOULDER ARTHROSCOPY WITH ROTATOR CUFF REPAIR Right   . TONSILLECTOMY AND ADENOIDECTOMY    . TRIGGER FINGER RELEASE Right 10/28/2014   Procedure: RIGHT THUMB AND LONG FINGER RELEASE TRIGGER ;  Surgeon: Leanora Cover, MD;  Location: [facility];  Service: Orthopedics;  Laterality: Right;  . TUBAL LIGATION    . UMBILICAL HERNIA REPAIR      Family History  Problem Relation Age of Onset  . Anesthesia problems Mother        hard to wake up post-op  . Anesthesia problems Father        woke up during surgery    SOCIAL HISTORY: Social History   Socioeconomic History  . Marital status: Married    Spouse name: Not on file  . Number of children: Not on file  . Years of education: Not on file  . Highest education level: Not on file  Occupational History  . Not on file  Social Needs  . Financial resource strain: Not on file  . Food insecurity    Worry: Not on file    Inability: Not on file  . Transportation needs    Medical: Not on file    Non-medical: Not on file  Tobacco Use  . Smoking status: Current Every Day Smoker    Packs/day: 1.50    Years: 30.00    Pack years: 45.00    Types: Cigarettes  . Smokeless tobacco: Never Used  Substance and Sexual Activity  . Alcohol use: No  . Drug use: No  . Sexual activity: Not on file  Lifestyle  . Physical activity    Days per week: Not on file    Minutes per session: Not on file  . Stress: Not on file  Relationships  . Social Herbalist on phone: Not on file    Gets together: Not on file    Attends religious service: Not on file    Active member of club or organization: Not on file    Attends meetings of clubs or organizations: Not on file     Relationship status: Not on file  . Intimate partner violence    Fear of current or ex partner: Not on file    Emotionally abused: Not on file    Physically abused: Not on file    Forced sexual activity: Not on file  Other Topics Concern  . Not on file  Social History Narrative  . Not on file    Allergies  Allergen Reactions  . Cortisone Shortness Of Breath    Current Outpatient Medications  Medication Sig Dispense Refill  . Albuterol Sulfate (PROAIR RESPICLICK) 123XX123 (90 Base) MCG/ACT AEPB INHALE 1-2 PUFFS 3 TIMES A DAY AS NEEDED FOR SHORTNESS OF BREATH OR WHEEZING    . oxybutynin (DITROPAN-XL) 10 MG 24 hr tablet TAKE 1 TABLET BY MOUTH EVERY DAY    . amLODipine (NORVASC) 5 MG tablet Take 5 mg by mouth daily.    Marland Kitchen aspirin 81 MG tablet Take 81 mg by mouth daily.    . empagliflozin (JARDIANCE) 25 MG TABS tablet Take by mouth.    . famotidine (PEPCID) 40 MG tablet Take by mouth.    . folic acid (FOLVITE) 1 MG tablet Take by mouth.    . gabapentin (NEURONTIN) 300 MG capsule Take by mouth.    Marland Kitchen HUMIRA PEN 40 MG/0.4ML PNKT     . HYDROcodone-acetaminophen (NORCO/VICODIN) 5-325 MG tablet Take 1 tablet by mouth every 4 (four) hours as needed. 10 tablet 0  . insulin detemir (LEVEMIR) 100 UNIT/ML injection Inject 85 Units into the skin at bedtime.    . insulin detemir (LEVEMIR) 100 UNIT/ML injection Inject 50 Units into the skin daily with breakfast.    . losartan-hydrochlorothiazide (HYZAAR) 100-25 MG per tablet Take 1 tablet by mouth daily.    . metFORMIN (GLUCOPHAGE) 1000 MG tablet Take 1,000 mg by mouth 2 (two) times daily with a meal.    . Methotrexate, Anti-Rheumatic, (METHOTREXATE, PF, [place]) Take by mouth.    . Multiple Vitamin (MULTIVITAMIN) tablet Take 1 tablet by mouth daily.    . rosuvastatin (CRESTOR) 20 MG tablet Take 20 mg by mouth daily.    Marland Kitchen venlafaxine (EFFEXOR) 75 MG tablet Take 75 mg by mouth daily before breakfast.    . venlafaxine (EFFEXOR) 75 MG tablet Take 150 mg by mouth  at bedtime.     No current facility-administered medications for this visit.  ROS:   General:  No weight loss, Fever, chills  HEENT: No recent headaches, no nasal bleeding, no visual changes, no sore throat  Neurologic: No dizziness, blackouts, seizures. No recent symptoms of stroke or mini- stroke. No recent episodes of slurred speech, or temporary blindness.  Cardiac: No recent episodes of chest pain/pressure, no shortness of breath at rest.  No shortness of breath with exertion.  Denies history of atrial fibrillation or irregular heartbeat  Vascular: No history of rest pain in feet.  No history of claudication.  No history of non-healing ulcer, No history of DVT   Pulmonary: No home oxygen, no productive cough, no hemoptysis,  No asthma or wheezing  Musculoskeletal:  [X]  Arthritis, [ ]  Low back pain,  [X]  Joint pain  Hematologic:No history of hypercoagulable state.  No history of easy bleeding.  No history of anemia  Gastrointestinal: No hematochezia or melena,  + gastroesophageal reflux, no trouble swallowing  Urinary: [ ]  chronic Kidney disease, [ ]  on HD - [ ]  MWF or [ ]  TTHS, [ ]  Burning with urination, [ ]  Frequent urination, [ ]  Difficulty urinating;   Skin: No rashes  Psychological: No history of anxiety,  No history of depression   Physical Examination  Vitals:   03/12/19 1304  BP: 127/78  Pulse: 73  Resp: 20  SpO2: 97%  Weight: 169 lb (76.7 kg)  Height: 5\' 5"  (1.651 m)    General:  Alert and oriented, no acute distress HEENT: Normal Neck: No JVD Pulmonary: Clear to auscultation bilaterally Cardiac: Regular Rate and Rhythm  Abdomen: Soft, non-tender, non-distended, no mass, obese Skin: No rash, small ulceration medial aspect left first toe nailbed Extremity Pulses:  2+ radial, brachial, 2+ right absent left femoral, absent left popliteal dorsalis pedis, posterior tibial pulses 2+ right dorsalis pedis Musculoskeletal: No deformity or edema   Neurologic: Upper and lower extremity motor 5/5 and symmetric  DATA:  Patient had recent ABIs at Dr. Janace Aris office.  Left side was 0.69 right was 1.08 I reviewed this report today.  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 Donzetta Matters on Monday, March 16, 2019.  Risk benefits possible complications and procedure details including not limited to bleeding infection vessel injury contrast reaction were discussed with patient today.  She understands and agrees to proceed.  She was also started on aspirin 81 mg once a day today.   Ruta Hinds, MD [facility] Office: [phone] Pager: [phone]

## 2019-03-12 NOTE — Discharge Instructions (Addendum)
Prescription given for Norco. Take medication as directed and do not operate machinery, drive a car, or work while taking this medication as it can make you drowsy.   Please call the vascular surgeon to try to get an earlier appointment. You were given information to follow up with Dr. Trula Slade who also works in vascular surgery. You may call the office as well to try to get an appointment.   Return to the emergency room immediately if you have any color or temperature changes to your left leg, increased pain, or any new or worsening symptoms.

## 2019-03-12 NOTE — ED Provider Notes (Signed)
[facility] DEPT Provider Note   CSN: [id number] Arrival date & time: 03/11/19  2014     History   Chief Complaint Chief Complaint  Patient presents with  . Foot Pain    HPI Karen Wu is a 53 y.o. female.     HPI   53 year old female with a history of brain tumor, GERD, Graves' disease, anemia, heart murmur, hypertension, diabetes, who presents the emergency department today for evaluation of left lower extremity pain.  States that she started having and abnormal sensation/irritation to the left lower extremity from the knee down.  States it feels like she has a tightness wrapped around her leg and it is intermittently numb.  She is also had pain to the left great toe for the same time period.  She has been evaluated by her PCP, urgent care and also by podiatry.  She has been recommended to follow-up with vascular surgery due to concern for peripheral vascular disease.  She had ABIs completed by her PCP.  She does have appointment with vascular surgery but not until September 22.  She has been taking ibuprofen and tramadol at home without significant improvement of symptoms.  States she has had discoloration to the left great toe for about 7 weeks.  It is unchanged today.  She does not have any calf tenderness or edema to the left lower extremity.  Past Medical History:  Diagnosis Date  . Arthritis    hips, knees, lower back  . Asthma    rare inhaler use  . Brain tumor ([place])    x 2 - appear to be meningiomas, per neurosurgeon's report  . Dental crown present   . Family history of adverse reaction to anesthesia    pt's father woke up during surgery; pt's mother has hx. of being hard to wake up post-op  . GERD (gastroesophageal reflux disease)    OTC as needed  . Graves' disease    no current med.  Marland Kitchen Heart murmur    as a child, states no problems  . History of anemia    no problems since endometrial ablation  . Hypertension    under control  with meds., per pt.; has been on med. x 21 yr.  . Insulin dependent diabetes mellitus ([place])   . Trigger finger of right hand 10/2014   thumb and long finger    There are no active problems to display for this patient.   Past Surgical History:  Procedure Laterality Date  . BLADDER SUSPENSION  01/2014  . BREAST MASS EXCISION Left 05/30/2005  . ENDOMETRIAL ABLATION  01/2014  . FOOT SURGERY Left    exc. cyst and bone fragments  . MICROLARYNGOSCOPY WITH CO2 LASER AND EXCISION OF VOCAL CORD LESION  03/2014  . SHOULDER ARTHROSCOPY WITH ROTATOR CUFF REPAIR Right   . TONSILLECTOMY AND ADENOIDECTOMY    . TRIGGER FINGER RELEASE Right 10/28/2014   Procedure: RIGHT THUMB AND LONG FINGER RELEASE TRIGGER ;  Surgeon: Leanora Cover, MD;  Location: [facility];  Service: Orthopedics;  Laterality: Right;  . TUBAL LIGATION    . UMBILICAL HERNIA REPAIR       OB History   No obstetric history on file.      Home Medications    Prior to Admission medications   Medication Sig Start Date End Date Taking? Authorizing Provider  amLODipine (NORVASC) 5 MG tablet Take 5 mg by mouth daily.    [provider]  aspirin 81  MG tablet Take 81 mg by mouth daily.    [provider]  HYDROcodone-acetaminophen (NORCO/VICODIN) 5-325 MG tablet Take 1 tablet by mouth every 4 (four) hours as needed. 03/12/19   Sharolyn Weber S, PA-C  insulin detemir (LEVEMIR) 100 UNIT/ML injection Inject 85 Units into the skin at bedtime.    [provider]  insulin detemir (LEVEMIR) 100 UNIT/ML injection Inject 50 Units into the skin daily with breakfast.    [provider]  losartan-hydrochlorothiazide (HYZAAR) 100-25 MG per tablet Take 1 tablet by mouth daily.    [provider]  metFORMIN (GLUCOPHAGE) 1000 MG tablet Take 1,000 mg by mouth 2 (two) times daily with a meal.    [provider]  Multiple Vitamin (MULTIVITAMIN) tablet Take 1 tablet by mouth daily.    [provider]  rosuvastatin (CRESTOR) 20 MG tablet Take 20 mg by mouth daily.    [provider]  venlafaxine (EFFEXOR) 75 MG tablet Take 75 mg by mouth daily before breakfast.    [provider]  venlafaxine (EFFEXOR) 75 MG tablet Take 150 mg by mouth at bedtime.    [provider]    Family History Family History  Problem Relation Age of Onset  . Anesthesia problems Mother        hard to wake up post-op  . Anesthesia problems Father        woke up during surgery    Social History Social History   Tobacco Use  . Smoking status: Current Every Day Smoker    Packs/day: 1.50    Years: 30.00    Pack years: 45.00    Types: Cigarettes  . Smokeless tobacco: Never Used  Substance Use Topics  . Alcohol use: No  . Drug use: No     Allergies   Cortisone   Review of Systems Review of Systems  Constitutional: Negative for fever.  HENT: Negative for congestion.   Eyes: Negative for visual disturbance.  Respiratory: Negative for shortness of breath.   Cardiovascular: Negative for chest pain.  Gastrointestinal: Negative for abdominal pain.  Genitourinary: Negative for pelvic pain.  Musculoskeletal:       LLE pain  Skin: Positive for color change.  Neurological: Positive for numbness. Negative for weakness.     Physical Exam Updated Vital Signs BP 137/72 (BP Location: Left Arm)   Pulse 79   Temp 98.3 F (36.8 C) (Oral)   Resp 20   Ht 5\' 5"  (1.651 m)   Wt 76.7 kg   SpO2 98%   BMI 28.12 kg/m   Physical Exam Vitals signs and nursing note reviewed.  Constitutional:      General: She is not in acute distress.    Appearance: She is well-developed.  HENT:     Head: Normocephalic and atraumatic.  Eyes:     Conjunctiva/sclera: Conjunctivae normal.  Neck:     Musculoskeletal: Neck supple.  Cardiovascular:     Rate and Rhythm: Normal rate and regular rhythm.  Pulmonary:     Effort: Pulmonary effort is normal. No respiratory distress.   Musculoskeletal:        General: Tenderness (left great toe) present.     Right lower leg: No edema.     Left lower leg: No edema.     Comments: Able to doppler left DP pulse. Brisk cap refill to all toes on the left foot. Temperature equal to BLE. Small wound to the left great toe (unchanged per patient)  Skin:  General: Skin is warm and dry.  Neurological:     Mental Status: She is alert.      ED Treatments / Results  Labs (all labs ordered are listed, but only abnormal results are displayed) Labs Reviewed - No data to display  EKG None  Radiology No results found.  Procedures Procedures (including critical care time)  Medications Ordered in ED Medications  HYDROcodone-acetaminophen (NORCO/VICODIN) 5-325 MG per tablet 1 tablet (1 tablet Oral Given 03/12/19 0022)     Initial Impression / Assessment and Plan / ED Course  I have reviewed the triage vital signs and the nursing notes.  Pertinent labs & imaging results that were available during my care of the patient were reviewed by me and considered in my medical decision making (see chart for details).     Final Clinical Impressions(s) / ED Diagnoses   Final diagnoses:  Left leg pain   53 year old female who presents for evaluation of left lower extremity pain and left great toe pain/discoloration ongoing for several weeks. Sxs relatively unchanged today, but she could no longer stand the pain. Has had ABIs with pcp and has been recommended for vascular surgery f/u which she has scheduled for Sept 22.   On exam, I am able to doppler left DP pulse. Brisk cap refill to all toes on the left foot. Temperature equal to BLE. Small wound to the left great toe (unchanged per patient).   I suspect sxs are secondary to PVD though she does not appear to have any acute occlusion/ischemic that would require emergent intervention. I gave pain medication in the ED and I will give her RX for home. She currently takes tramadol for  pain which I advised her to discontinue if she takes norco. Advised to f/u immediately for any color/temperature changes, or any increased pain. She and her husband at bedside voice understanding and are in agreement with plan. All questions answered, pt stable for d/c.     ED Discharge Orders         Ordered    HYDROcodone-acetaminophen (NORCO/VICODIN) 5-325 MG tablet  Every 4 hours PRN     03/12/19 0042           Rodney Booze, PA-C 03/12/19 0043    Orpah Greek, MD 03/12/19 2325

## 2019-03-16 ENCOUNTER — Encounter (HOSPITAL_COMMUNITY)
Admission: RE | Disposition: A | Discharge status: Home / Self Care | Payer: Self-pay | Source: Home / Self Care | Attending: Vascular Surgery

## 2019-03-16 ENCOUNTER — Other Ambulatory Visit: Payer: Self-pay

## 2019-03-16 ENCOUNTER — Observation Stay (HOSPITAL_COMMUNITY): Payer: Federal, State, Local not specified - PPO

## 2019-03-16 ENCOUNTER — Observation Stay (HOSPITAL_COMMUNITY)
Admission: RE | Admit: 2019-03-16 | Discharge: 2019-03-18 | Disposition: A | Discharge status: Home / Self Care | Payer: Federal, State, Local not specified - PPO | Attending: Vascular Surgery | Admitting: Vascular Surgery

## 2019-03-16 DIAGNOSIS — J45909 Unspecified asthma, uncomplicated: Secondary | ICD-10-CM | POA: Diagnosis not present

## 2019-03-16 DIAGNOSIS — Z7982 Long term (current) use of aspirin: Secondary | ICD-10-CM | POA: Diagnosis not present

## 2019-03-16 DIAGNOSIS — I10 Essential (primary) hypertension: Secondary | ICD-10-CM | POA: Diagnosis not present

## 2019-03-16 DIAGNOSIS — I70245 Atherosclerosis of native arteries of left leg with ulceration of other part of foot: Principal | ICD-10-CM | POA: Insufficient documentation

## 2019-03-16 DIAGNOSIS — E1151 Type 2 diabetes mellitus with diabetic peripheral angiopathy without gangrene: Secondary | ICD-10-CM | POA: Insufficient documentation

## 2019-03-16 DIAGNOSIS — Z794 Long term (current) use of insulin: Secondary | ICD-10-CM | POA: Insufficient documentation

## 2019-03-16 DIAGNOSIS — F17210 Nicotine dependence, cigarettes, uncomplicated: Secondary | ICD-10-CM | POA: Insufficient documentation

## 2019-03-16 DIAGNOSIS — L97529 Non-pressure chronic ulcer of other part of left foot with unspecified severity: Secondary | ICD-10-CM | POA: Insufficient documentation

## 2019-03-16 DIAGNOSIS — M1990 Unspecified osteoarthritis, unspecified site: Secondary | ICD-10-CM | POA: Diagnosis not present

## 2019-03-16 DIAGNOSIS — Z79899 Other long term (current) drug therapy: Secondary | ICD-10-CM | POA: Diagnosis not present

## 2019-03-16 DIAGNOSIS — E11621 Type 2 diabetes mellitus with foot ulcer: Secondary | ICD-10-CM | POA: Diagnosis not present

## 2019-03-16 DIAGNOSIS — L600 Ingrowing nail: Secondary | ICD-10-CM | POA: Diagnosis not present

## 2019-03-16 DIAGNOSIS — K219 Gastro-esophageal reflux disease without esophagitis: Secondary | ICD-10-CM | POA: Insufficient documentation

## 2019-03-16 DIAGNOSIS — Z95820 Peripheral vascular angioplasty status with implants and grafts: Secondary | ICD-10-CM

## 2019-03-16 DIAGNOSIS — S37012A Minor contusion of left kidney, initial encounter: Secondary | ICD-10-CM | POA: Diagnosis not present

## 2019-03-16 HISTORY — PX: ABDOMINAL AORTOGRAM W/LOWER EXTREMITY: CATH118223

## 2019-03-16 HISTORY — PX: PERIPHERAL VASCULAR INTERVENTION: CATH118257

## 2019-03-16 LAB — HEMOGLOBIN A1C
Hgb A1c MFr Bld: 8 % — ABNORMAL HIGH (ref 4.8–5.6)
Mean Plasma Glucose: 182.9 mg/dL

## 2019-03-16 LAB — CBC
HCT: 36.3 % (ref 36.0–46.0)
HCT: 41.6 % (ref 36.0–46.0)
Hemoglobin: 11.6 g/dL — ABNORMAL LOW (ref 12.0–15.0)
Hemoglobin: 13.4 g/dL (ref 12.0–15.0)
MCH: 27.9 pg (ref 26.0–34.0)
MCH: 28.1 pg (ref 26.0–34.0)
MCHC: 32 g/dL (ref 30.0–36.0)
MCHC: 32.2 g/dL (ref 30.0–36.0)
MCV: 86.5 fL (ref 80.0–100.0)
MCV: 87.9 fL (ref 80.0–100.0)
Platelets: 391 10*3/uL (ref 150–400)
Platelets: 501 10*3/uL — ABNORMAL HIGH (ref 150–400)
RBC: 4.13 MIL/uL (ref 3.87–5.11)
RBC: 4.81 MIL/uL (ref 3.87–5.11)
RDW: 16.5 % — ABNORMAL HIGH (ref 11.5–15.5)
RDW: 16.5 % — ABNORMAL HIGH (ref 11.5–15.5)
WBC: 22.2 10*3/uL — ABNORMAL HIGH (ref 4.0–10.5)
WBC: 24.8 10*3/uL — ABNORMAL HIGH (ref 4.0–10.5)
nRBC: 0 % (ref 0.0–0.2)
nRBC: 0 % (ref 0.0–0.2)

## 2019-03-16 LAB — BASIC METABOLIC PANEL
Anion gap: 16 — ABNORMAL HIGH (ref 5–15)
BUN: 16 mg/dL (ref 6–20)
CO2: 20 mmol/L — ABNORMAL LOW (ref 22–32)
Calcium: 8.6 mg/dL — ABNORMAL LOW (ref 8.9–10.3)
Chloride: 101 mmol/L (ref 98–111)
Creatinine, Ser: 0.73 mg/dL (ref 0.44–1.00)
GFR calc Af Amer: >60 mL/min (ref >60)
GFR calc non Af Amer: >60 mL/min (ref >60)
Glucose, Bld: 218 mg/dL — ABNORMAL HIGH (ref 70–99)
Potassium: 3.2 mmol/L — ABNORMAL LOW (ref 3.5–5.1)
Sodium: 137 mmol/L (ref 135–145)

## 2019-03-16 LAB — GLUCOSE, CAPILLARY
Glucose-Capillary: 231 mg/dL — ABNORMAL HIGH (ref 70–99)
Glucose-Capillary: 300 mg/dL — ABNORMAL HIGH (ref 70–99)

## 2019-03-16 LAB — POCT I-STAT, CHEM 8
BUN: 20 mg/dL (ref 6–20)
Calcium, Ion: 1.15 mmol/L (ref 1.15–1.40)
Chloride: 101 mmol/L (ref 98–111)
Creatinine, Ser: 0.5 mg/dL (ref 0.44–1.00)
Glucose, Bld: 145 mg/dL — ABNORMAL HIGH (ref 70–99)
HCT: 50 % — ABNORMAL HIGH (ref 36.0–46.0)
Hemoglobin: 17 g/dL — ABNORMAL HIGH (ref 12.0–15.0)
Potassium: 4.8 mmol/L (ref 3.5–5.1)
Sodium: 138 mmol/L (ref 135–145)
TCO2: 29 mmol/L (ref 22–32)

## 2019-03-16 LAB — CBC WITH DIFFERENTIAL/PLATELET
Abs Immature Granulocytes: 0.54 10*3/uL — ABNORMAL HIGH (ref 0.00–0.07)
Basophils Absolute: 0.2 10*3/uL — ABNORMAL HIGH (ref 0.0–0.1)
Basophils Relative: 1 %
Eosinophils Absolute: 0.1 10*3/uL (ref 0.0–0.5)
Eosinophils Relative: 0 %
HCT: 42 % (ref 36.0–46.0)
Hemoglobin: 13.5 g/dL (ref 12.0–15.0)
Immature Granulocytes: 2 %
Lymphocytes Relative: 8 %
Lymphs Abs: 2.9 10*3/uL (ref 0.7–4.0)
MCH: 28.1 pg (ref 26.0–34.0)
MCHC: 32.1 g/dL (ref 30.0–36.0)
MCV: 87.3 fL (ref 80.0–100.0)
Monocytes Absolute: 2.5 10*3/uL — ABNORMAL HIGH (ref 0.1–1.0)
Monocytes Relative: 7 %
Neutro Abs: 30.6 10*3/uL — ABNORMAL HIGH (ref 1.7–7.7)
Neutrophils Relative %: 82 %
Platelets: UNDETERMINED 10*3/uL (ref 150–400)
RBC: 4.81 MIL/uL (ref 3.87–5.11)
RDW: 16.4 % — ABNORMAL HIGH (ref 11.5–15.5)
WBC: 36.8 10*3/uL — ABNORMAL HIGH (ref 4.0–10.5)
nRBC: 0 % (ref 0.0–0.2)

## 2019-03-16 LAB — TROPONIN I (HIGH SENSITIVITY)
Troponin I (High Sensitivity): 4 ng/L (ref <18)
Troponin I (High Sensitivity): 6 ng/L (ref <18)

## 2019-03-16 SURGERY — ABDOMINAL AORTOGRAM W/LOWER EXTREMITY
Anesthesia: LOCAL | Laterality: Left

## 2019-03-16 MED ORDER — SODIUM CHLORIDE 0.9 % IV SOLN: INTRAVENOUS | Status: AC

## 2019-03-16 MED ORDER — MORPHINE SULFATE (PF) 2 MG/ML IV SOLN
2.0000 mg | INTRAVENOUS | Status: DC | PRN
  Administered 2019-03-16 (×2): 2 mg via INTRAVENOUS
  Filled 2019-03-16 (×2): qty 1

## 2019-03-16 MED ORDER — SODIUM CHLORIDE 0.9% FLUSH
3.0000 mL | Freq: Two times a day (BID) | INTRAVENOUS | Status: DC
  Administered 2019-03-17 – 2019-03-18 (×3): 3 mL via INTRAVENOUS

## 2019-03-16 MED ORDER — HEPARIN (PORCINE) IN NACL 1000-0.9 UT/500ML-% IV SOLN
INTRAVENOUS | Status: DC | PRN
  Administered 2019-03-16 (×2): 500 mL

## 2019-03-16 MED ORDER — CLOPIDOGREL BISULFATE 300 MG PO TABS
ORAL_TABLET | ORAL | Status: DC | PRN
  Administered 2019-03-16: 300 mg via ORAL

## 2019-03-16 MED ORDER — CLOPIDOGREL BISULFATE 300 MG PO TABS
ORAL_TABLET | ORAL | Status: AC
  Filled 2019-03-16: qty 1

## 2019-03-16 MED ORDER — IODIXANOL 320 MG/ML IV SOLN
INTRAVENOUS | Status: DC | PRN
  Administered 2019-03-16: 13:00:00 165 mL via INTRAVENOUS

## 2019-03-16 MED ORDER — ACETAMINOPHEN 325 MG PO TABS
650.0000 mg | ORAL_TABLET | ORAL | Status: DC | PRN
  Administered 2019-03-17 – 2019-03-18 (×3): 650 mg via ORAL
  Filled 2019-03-16 (×3): qty 2

## 2019-03-16 MED ORDER — LIDOCAINE HCL (PF) 1 % IJ SOLN
INTRAMUSCULAR | Status: DC | PRN
  Administered 2019-03-16: 18 mL via INTRADERMAL

## 2019-03-16 MED ORDER — HEPARIN (PORCINE) IN NACL 1000-0.9 UT/500ML-% IV SOLN
INTRAVENOUS | Status: AC
  Filled 2019-03-16: qty 1000

## 2019-03-16 MED ORDER — HYDROMORPHONE HCL 1 MG/ML IJ SOLN
INTRAMUSCULAR | Status: AC
  Filled 2019-03-16: qty 1

## 2019-03-16 MED ORDER — INSULIN ASPART 100 UNIT/ML ~~LOC~~ SOLN
0.0000 [IU] | Freq: Every day | SUBCUTANEOUS | Status: DC
  Administered 2019-03-16: 3 [IU] via SUBCUTANEOUS

## 2019-03-16 MED ORDER — OXYCODONE-ACETAMINOPHEN 5-325 MG PO TABS
ORAL_TABLET | ORAL | Status: AC
  Filled 2019-03-16: qty 1

## 2019-03-16 MED ORDER — OXYCODONE HCL 5 MG PO TABS
ORAL_TABLET | ORAL | Status: AC
  Filled 2019-03-16: qty 1

## 2019-03-16 MED ORDER — LABETALOL HCL 5 MG/ML IV SOLN: 10.0000 mg | INTRAVENOUS | Status: DC | PRN

## 2019-03-16 MED ORDER — MIDAZOLAM HCL 2 MG/2ML IJ SOLN
INTRAMUSCULAR | Status: DC | PRN
  Administered 2019-03-16: 2 mg via INTRAVENOUS

## 2019-03-16 MED ORDER — SODIUM CHLORIDE 0.9 % IV SOLN: INTRAVENOUS | Status: DC

## 2019-03-16 MED ORDER — SODIUM CHLORIDE 0.9% FLUSH: 3.0000 mL | INTRAVENOUS | Status: DC | PRN

## 2019-03-16 MED ORDER — LIDOCAINE HCL (PF) 1 % IJ SOLN
INTRAMUSCULAR | Status: AC
  Filled 2019-03-16: qty 30

## 2019-03-16 MED ORDER — HYDRALAZINE HCL 20 MG/ML IJ SOLN: 5.0000 mg | INTRAMUSCULAR | Status: DC | PRN

## 2019-03-16 MED ORDER — CLOPIDOGREL BISULFATE 75 MG PO TABS
75.0000 mg | ORAL_TABLET | Freq: Every day | ORAL | Status: DC
  Administered 2019-03-17 – 2019-03-18 (×2): 75 mg via ORAL
  Filled 2019-03-16 (×2): qty 1

## 2019-03-16 MED ORDER — OXYCODONE HCL 5 MG PO TABS
5.0000 mg | ORAL_TABLET | ORAL | Status: DC | PRN
  Administered 2019-03-16 – 2019-03-17 (×3): 5 mg via ORAL
  Administered 2019-03-17 – 2019-03-18 (×5): 10 mg via ORAL
  Filled 2019-03-16: qty 2
  Filled 2019-03-16 (×2): qty 1
  Filled 2019-03-16 (×4): qty 2

## 2019-03-16 MED ORDER — MORPHINE SULFATE (PF) 10 MG/ML IV SOLN: 2.0000 mg | INTRAVENOUS | Status: DC | PRN

## 2019-03-16 MED ORDER — MIDAZOLAM HCL 2 MG/2ML IJ SOLN
INTRAMUSCULAR | Status: AC
  Filled 2019-03-16: qty 2

## 2019-03-16 MED ORDER — CLOPIDOGREL BISULFATE 75 MG PO TABS: 300.0000 mg | ORAL_TABLET | Freq: Once | ORAL | Status: DC

## 2019-03-16 MED ORDER — FENTANYL CITRATE (PF) 100 MCG/2ML IJ SOLN
INTRAMUSCULAR | Status: DC | PRN
  Administered 2019-03-16: 50 ug via INTRAVENOUS

## 2019-03-16 MED ORDER — HEPARIN SODIUM (PORCINE) 1000 UNIT/ML IJ SOLN
INTRAMUSCULAR | Status: DC | PRN
  Administered 2019-03-16: 8000 [IU] via INTRAVENOUS

## 2019-03-16 MED ORDER — SODIUM CHLORIDE 0.9 % IV SOLN: 250.0000 mL | INTRAVENOUS | Status: DC | PRN

## 2019-03-16 MED ORDER — HEPARIN SODIUM (PORCINE) 1000 UNIT/ML IJ SOLN
INTRAMUSCULAR | Status: AC
  Filled 2019-03-16: qty 1

## 2019-03-16 MED ORDER — SODIUM CHLORIDE 0.9 % IV SOLN
INTRAVENOUS | Status: DC
  Administered 2019-03-16: 11:00:00 via INTRAVENOUS

## 2019-03-16 MED ORDER — FENTANYL CITRATE (PF) 100 MCG/2ML IJ SOLN
INTRAMUSCULAR | Status: AC
  Filled 2019-03-16: qty 2

## 2019-03-16 MED ORDER — ONDANSETRON HCL 4 MG/2ML IJ SOLN
4.0000 mg | Freq: Four times a day (QID) | INTRAMUSCULAR | Status: DC | PRN
  Administered 2019-03-16 – 2019-03-17 (×2): 4 mg via INTRAVENOUS
  Filled 2019-03-16 (×2): qty 2

## 2019-03-16 MED ORDER — INSULIN ASPART 100 UNIT/ML ~~LOC~~ SOLN
0.0000 [IU] | Freq: Three times a day (TID) | SUBCUTANEOUS | Status: DC
  Administered 2019-03-17: 5 [IU] via SUBCUTANEOUS
  Administered 2019-03-17 – 2019-03-18 (×2): 2 [IU] via SUBCUTANEOUS
  Administered 2019-03-18: 8 [IU] via SUBCUTANEOUS
  Administered 2019-03-18: 2 [IU] via SUBCUTANEOUS

## 2019-03-16 MED ORDER — CLOPIDOGREL BISULFATE 75 MG PO TABS: 75.0000 mg | ORAL_TABLET | Freq: Every day | ORAL | 11 refills | Status: DC

## 2019-03-16 MED ORDER — HYDROMORPHONE HCL 1 MG/ML IJ SOLN
0.5000 mg | INTRAMUSCULAR | Status: AC
  Administered 2019-03-16: 0.5 mg via INTRAVENOUS

## 2019-03-16 SURGICAL SUPPLY — 17 items
CATH ANGIO 5F BER2 65CM (CATHETERS) ×1 IMPLANT
CATH OMNI FLUSH 5F 65CM (CATHETERS) ×1 IMPLANT
CLOSURE MYNX CONTROL 5F (Vascular Products) ×1 IMPLANT
DEVICE CLOSURE PERCLS PRGLD 6F (VASCULAR PRODUCTS) IMPLANT
GLIDEWIRE ADV .035X180CM (WIRE) ×1 IMPLANT
KIT ENCORE 26 ADVANTAGE (KITS) ×1 IMPLANT
KIT MICROPUNCTURE NIT STIFF (SHEATH) ×1 IMPLANT
KIT PV (KITS) ×3 IMPLANT
PERCLOSE PROGLIDE 6F (VASCULAR PRODUCTS) ×6
SHEATH BRITE TIP 8FR 35CM (SHEATH) ×1 IMPLANT
SHEATH PINNACLE 5F 10CM (SHEATH) ×2 IMPLANT
SHEATH PROBE COVER 6X72 (BAG) ×1 IMPLANT
STENT VIABAHN VBX 8X79X80 (Permanent Stent) ×1 IMPLANT
SYR MEDRAD MARK 7 150ML (SYRINGE) ×3 IMPLANT
TRANSDUCER W/STOPCOCK (MISCELLANEOUS) ×3 IMPLANT
TRAY PV CATH (CUSTOM PROCEDURE TRAY) ×3 IMPLANT
WIRE BENTSON .035X145CM (WIRE) ×2 IMPLANT

## 2019-03-16 NOTE — Progress Notes (Signed)
Reviewed CT abdomen pelvis without contrast and large left perinephric hematoma.  I do not see any evidence of hemorrhage adjacent to the iliac stent and or the aorta.  The access sites in both groins also show no evidence of adjacent hemorrhage.  Not sure if this could be spontaneous perinephric bleed from heparin administration.  Reevaluated patient tonight and she is much more comfortable.  Blood pressures have been stable on the floor.  Her last episode of hypotension was at 330 pm this afternoon when she had an orthostatic event.  She is no longer diaphoretic and appears much more comfortable.  Updated her and her husband of the CT findings.  Discussed that we would plan to trend her hemoglobin every 6 hours tonight.  Last hgb 13.4 --> 13.5.  Plan to manage this conservatively with supportive care assuming she does not drop her hemoglobin or her blood pressure again.   Marty Heck, MD [facility] Office: [phone] Pager: [place]

## 2019-03-16 NOTE — Progress Notes (Signed)
When I went in to given pain med pt is sleeping. Had to wake her in order to check her groin sites. States pain is better, then goes back to sleep.

## 2019-03-16 NOTE — Progress Notes (Signed)
Discharge instructions given to pt husband via telephone. Voices understanding.

## 2019-03-16 NOTE — Progress Notes (Signed)
Paged Gwenette Greet, [place] and instructed that Dr. Carlis Abbott is [facility] on call.  Spoke with Dr. Carlis Abbott about patient assessment and asked for someone to come see her.  He said he will come see her when he is available.  Keep plan for admission and observation per previous instructions from Dr. Donzetta Matters.

## 2019-03-16 NOTE — Progress Notes (Signed)
Pt resting in bed with husband at bedside. VSS.  CT states they are "backed with ED traumas."  Will con't to monitor and follow up on all labs

## 2019-03-16 NOTE — Significant Event (Signed)
Rapid Response Event Note  Overview: Time Called: 1703 Arrival Time: 1710 Event Type: Cardiac, Other (Comment)  Initial Focused Assessment: On arrival, pt resting in bed. Opens eyes to voice, oriented x4, HR 90s, BP 154/95, RR 22, spO2 99% on RA. PT c/o back pain, bilateral groin site soft, CDI, no hematoma noted.   Interventions: EKG, Labs (CBC, Troponin), CT abd pelvis  Plan of Care (if not transferred): Continue to monitor pt. Frequent vitals, awaiting lab results/CT results for bleeding. RN instructed to call with any changes or concerns.   Event Summary: Name of Physician Notified: Dr Carlis Abbott at (prior to my arrival)   Outcome: Stayed in room and stabalized     Sherilyn Dacosta

## 2019-03-16 NOTE — Progress Notes (Signed)
C/o nausea Zofran given as ordered.

## 2019-03-16 NOTE — H&P (Signed)
   History and Physical Update  The patient was interviewed and re-examined.  The patient's previous History and Physical has been reviewed and is unchanged from recent office visit. Plan for aortogram with possible intervention on left.   Marice Angelino C. Donzetta Matters, MD [facility] Office: [phone] Pager: [phone]   03/16/2019, 9:18 AM

## 2019-03-16 NOTE — Progress Notes (Addendum)
Placed on 2L nasal cannula HR 130's sustained.  MD notified.

## 2019-03-16 NOTE — Progress Notes (Signed)
Attempted to get patient out of bed.  Pt became diaphoretic  and had blood pressure drop.  Complaints of dizziness.  Assisted back to bed , O2 at 2 liters/minute started.  HOB down.  Still diaphoretic but pressure up.  Complaints of back pain continue.

## 2019-03-16 NOTE — Progress Notes (Addendum)
Patient arrived from short stay, to [place] 25. Patient is lethargic, but arousable, but falls asleep easily while speaking.patient diaphoretic. And has voided in bed.  Patient with complaints of back pain. B groins level zero.  bp 154/95 sats 100% on room air.  heart rate 106. Dr. Carlis Abbott in room to see patient. Orders received. Yvette Loveless, Bettina Gavia RN

## 2019-03-16 NOTE — Op Note (Signed)
    Patient name: RUSHIE LUDOLPH MRN: [id number] DOB: [birth year] Sex: female  03/16/2019 Pre-operative Diagnosis: Critical left lower extremity ischemia with great toe ulceration Post-operative diagnosis:  Same Surgeon:  Erlene Quan C. Donzetta Matters, MD Procedure Performed: 1.  Ultrasound-guided cannulation bilateral common femoral arteries 2.  Aortogram bilateral lower extremity runoff 3.  Stent of left common iliac artery with 8 x 79 mm VBX 4.  Minx device closure right common femoral artery and pro-glide device closure left common femoral artery 5.  Moderate sedation with fentanyl Versed for 46 minutes   Indications: 53 year old female presented with left great toe ingrown toenail was nonhealing.  ABI was 0.6 no palpable left common femoral pulse was indicated for angiogram possible invention.  Findings: Aorta was free of flow-limiting stenosis.  Right lower extremity has no flow-limiting stenosis from the common iliac all the way through the tibials there is three-vessel runoff to the foot on the right.  On the left side which is the site of interest left common iliac artery is occluded reconstitutes at the takeoff of the external.  After stenting this is 0% residual stenosis and there is three-vessel runoff to the level of the foot.   Procedure:  The patient was identified in the holding area and taken to room 8.  The patient was then placed supine on the table and prepped and draped in the usual sterile fashion.  A time out was called.  Ultrasound was used to evaluate the right common femoral artery was noted be patent and compressible.  This was cannulated with micropuncture needle with direct ultrasound visualization.  Images saved the permanent record.  We placed micropuncture sheath followed by Bentson wire Omni catheter level of L1 performed aortogram.  We then performed angled view with the occluded left common iliac artery.  We then cannulated the left common femoral artery with micropuncture needle  under direct ultrasound visualization.  Images saved the permanent record.  Micropuncture sheath was placed followed by a Glidewire advantage and a short 5 [place] sheath.  Patient was fully heparinized.  We crossed the occluded segment confirmed intraluminal access.  We then primarily stented the left common iliac artery with 8 x 79 mm VBX.  Completion angiography demonstrated no residual stenosis there was still flow into the hypogastric artery on the left.  We then performed bilateral lower extremity runoff with three-vessel runoff to the tibials bilaterally.  We then deployed a minx device on the right side.  On the left side we placed a Bentson wire deployed a pro-glide device.  She tolerated procedure without immediate complication.  Contrast: 165cc  Jerrilynn Mikowski C. Donzetta Matters, MD [facility] [place] Office: [phone] Pager: [phone]

## 2019-03-16 NOTE — Progress Notes (Signed)
53 year old female that underwent bilateral common femoral artery access today by Dr. Donzetta Matters with left common iliac stent with a 8 x 79 VBX.  Reportedly has been complaining of back pain in recovery and had some orthostatic hypotension.  I was called this evening after the plan from Dr. Donzetta Matters was to admit the patient for observation given some orthostatic hypotension.  On my arrival patient was arousable but diaphoretic.  She is having no abdominal pain on exam.  Her bilateral groins are clean dry and intact with no hematoma and palpable femoral pulses in both groins.  Her feet are motor and sensory intact.  She has no abdominal pain exam on my assessment.  I did have nursing do a EKG at bedside and she has evidence of sinus tachycardia but no other acute changes.  When I left HR [id number], BP 99991111 systolic - no hypotension since 330 pm when she got up.  We will get stat set of labs including a troponin to rule out a cardiac event.  I also ordered a CT abdomen pelvis without contrast to ensure she does not have any retroperitoneal bleed.  I do not see any obvious evidence of bleeding on exam.  Her blood sugar was in the 240s and we will do moderate insulin scale.  We will continue to observe.  She did get a fair amount of pain medicine as well during procedure.  Marty Heck, MD [facility] [place] Office: [phone] Pager: [place]

## 2019-03-16 NOTE — Progress Notes (Addendum)
Dr Donzetta Matters called and informed  Of pt c/o pain and nausea. Pt moving about in bed moaning "help me" new orders noted.

## 2019-03-16 NOTE — Discharge Instructions (Signed)
° °  [facility] [place] ° °Discharge Instructions ° °Lower Extremity Angiogram; Angioplasty/Stenting ° °Please refer to the following instructions for your post-procedure care. Your surgeon or physician assistant will discuss any changes with you. ° °Activity ° °Avoid lifting more than 8 pounds (1 gallons of milk) for 72 hours (3 days) after your procedure. You may walk as much as you can tolerate. It's OK to drive after 72 hours. ° °Bathing/Showering ° °You may shower the day after your procedure. If you have a bandage, you may remove it at 24- 48 hours. Clean your incision site with mild soap and water. Pat the area dry with a clean towel. ° °Diet ° °Resume your pre-procedure diet. There are no special food restrictions following this procedure. All patients with peripheral vascular disease should follow a low fat/low cholesterol diet. In order to heal from your surgery, it is CRITICAL to get adequate nutrition. Your body requires vitamins, minerals, and protein. Vegetables are the best source of vitamins and minerals. Vegetables also provide the perfect balance of protein. Processed food has little nutritional value, so try to avoid this. ° °Medications ° °Resume taking all of your medications unless your doctor tells you not to. If your incision is causing pain, you may take over-the-counter pain relievers such as acetaminophen (Tylenol) ° °Follow Up ° °Follow up will be arranged at the time of your procedure. You may have an office visit scheduled or may be scheduled for surgery. Ask your surgeon if you have any questions. ° °Please call us immediately for any of the following conditions: °•Severe or worsening pain your legs or feet at rest or with walking. °•Increased pain, redness, drainage at your groin puncture site. °•Fever of 101 degrees or higher. °•If you have any mild or slow bleeding from your puncture site: lie down, apply firm constant pressure over the area with a piece of  gauze or a clean wash cloth for 30 minutes- no peeking!, call 911 right away if you are still bleeding after 30 minutes, or if the bleeding is heavy and unmanageable. ° °Reduce your risk factors of vascular disease: ° °Stop smoking. If you would like help call [facility] at 1-800-QUIT-NOW (1-800-784-8669) or [place] at 336-586-4000. °Manage your cholesterol °Maintain a desired weight °Control your diabetes °Keep your blood pressure down ° °If you have any questions, please call the office at [phone] ° °

## 2019-03-16 NOTE — Progress Notes (Signed)
Pt requested to get out of bed.  BP dropped immediately.  Dr. Donzetta Matters notified.  Complaints of severe back pain continue.  Orders received to admit pt.

## 2019-03-17 ENCOUNTER — Telehealth: Payer: Self-pay | Admitting: Vascular Surgery

## 2019-03-17 ENCOUNTER — Encounter (HOSPITAL_COMMUNITY): Payer: Self-pay | Admitting: Vascular Surgery

## 2019-03-17 DIAGNOSIS — Z794 Long term (current) use of insulin: Secondary | ICD-10-CM | POA: Diagnosis not present

## 2019-03-17 DIAGNOSIS — M1990 Unspecified osteoarthritis, unspecified site: Secondary | ICD-10-CM | POA: Diagnosis not present

## 2019-03-17 DIAGNOSIS — F17210 Nicotine dependence, cigarettes, uncomplicated: Secondary | ICD-10-CM | POA: Diagnosis not present

## 2019-03-17 DIAGNOSIS — Z79899 Other long term (current) drug therapy: Secondary | ICD-10-CM | POA: Diagnosis not present

## 2019-03-17 DIAGNOSIS — E1151 Type 2 diabetes mellitus with diabetic peripheral angiopathy without gangrene: Secondary | ICD-10-CM | POA: Diagnosis not present

## 2019-03-17 DIAGNOSIS — E11621 Type 2 diabetes mellitus with foot ulcer: Secondary | ICD-10-CM | POA: Diagnosis not present

## 2019-03-17 DIAGNOSIS — I70245 Atherosclerosis of native arteries of left leg with ulceration of other part of foot: Secondary | ICD-10-CM | POA: Diagnosis not present

## 2019-03-17 DIAGNOSIS — L600 Ingrowing nail: Secondary | ICD-10-CM | POA: Diagnosis not present

## 2019-03-17 DIAGNOSIS — K219 Gastro-esophageal reflux disease without esophagitis: Secondary | ICD-10-CM | POA: Diagnosis not present

## 2019-03-17 DIAGNOSIS — Z7982 Long term (current) use of aspirin: Secondary | ICD-10-CM | POA: Diagnosis not present

## 2019-03-17 DIAGNOSIS — L97529 Non-pressure chronic ulcer of other part of left foot with unspecified severity: Secondary | ICD-10-CM | POA: Diagnosis not present

## 2019-03-17 DIAGNOSIS — I10 Essential (primary) hypertension: Secondary | ICD-10-CM | POA: Diagnosis not present

## 2019-03-17 DIAGNOSIS — J45909 Unspecified asthma, uncomplicated: Secondary | ICD-10-CM | POA: Diagnosis not present

## 2019-03-17 LAB — CBC
HCT: 32 % — ABNORMAL LOW (ref 36.0–46.0)
HCT: 31.5 % — ABNORMAL LOW (ref 36.0–46.0)
Hemoglobin: 10.1 g/dL — ABNORMAL LOW (ref 12.0–15.0)
Hemoglobin: 10.1 g/dL — ABNORMAL LOW (ref 12.0–15.0)
MCH: 27.6 pg (ref 26.0–34.0)
MCH: 27.7 pg (ref 26.0–34.0)
MCHC: 31.6 g/dL (ref 30.0–36.0)
MCHC: 32.1 g/dL (ref 30.0–36.0)
MCV: 87.4 fL (ref 80.0–100.0)
MCV: 86.3 fL (ref 80.0–100.0)
Platelets: 382 10*3/uL (ref 150–400)
Platelets: 378 10*3/uL (ref 150–400)
RBC: 3.66 MIL/uL — ABNORMAL LOW (ref 3.87–5.11)
RBC: 3.65 MIL/uL — ABNORMAL LOW (ref 3.87–5.11)
RDW: 16.5 % — ABNORMAL HIGH (ref 11.5–15.5)
RDW: 16.6 % — ABNORMAL HIGH (ref 11.5–15.5)
WBC: 18.1 10*3/uL — ABNORMAL HIGH (ref 4.0–10.5)
WBC: 17.8 10*3/uL — ABNORMAL HIGH (ref 4.0–10.5)
nRBC: 0 % (ref 0.0–0.2)
nRBC: 0 % (ref 0.0–0.2)

## 2019-03-17 LAB — GLUCOSE, CAPILLARY
Glucose-Capillary: 184 mg/dL — ABNORMAL HIGH (ref 70–99)
Glucose-Capillary: 234 mg/dL — ABNORMAL HIGH (ref 70–99)
Glucose-Capillary: 137 mg/dL — ABNORMAL HIGH (ref 70–99)
Glucose-Capillary: 192 mg/dL — ABNORMAL HIGH (ref 70–99)

## 2019-03-17 MED ORDER — GABAPENTIN 300 MG PO CAPS
300.0000 mg | ORAL_CAPSULE | Freq: Two times a day (BID) | ORAL | Status: DC
  Administered 2019-03-17 – 2019-03-18 (×2): 300 mg via ORAL
  Filled 2019-03-17 (×2): qty 1

## 2019-03-17 MED ORDER — VENLAFAXINE HCL ER 75 MG PO CP24
225.0000 mg | ORAL_CAPSULE | Freq: Every day | ORAL | Status: DC
  Administered 2019-03-17: 225 mg via ORAL
  Filled 2019-03-17: qty 3

## 2019-03-17 NOTE — Plan of Care (Signed)
Poc progressing.  

## 2019-03-17 NOTE — Progress Notes (Signed)
Inpatient Diabetes Program Recommendations  AACE/ADA: New Consensus Statement on Inpatient Glycemic Control (2015)  Target Ranges:  Prepandial:   less than 140 mg/dL      Peak postprandial:   less than 180 mg/dL (1-2 hours)      Critically ill patients:  140 - 180 mg/dL   Results for Karen Wu, Karen Wu (MRN [id number]) as of 03/17/2019 09:45  Ref. Range 03/16/2019 16:43 03/16/2019 20:29 03/17/2019 06:40  Glucose-Capillary Latest Ref Range: 70 - 99 mg/dL 231 (H) 300 (H)  3 units NOVOLOG  184 (H)   Results for Karen Wu, Karen Wu (MRN [id number]) as of 03/17/2019 09:45  Ref. Range 03/16/2019 17:57  Hemoglobin A1C Latest Ref Range: 4.8 - 5.6 % 8.0 (H)    Admit with: Critical left lower extremity ischemia with great toe ulceration  History: DM  Home DM Meds: Levemir 5 units AM/ 45 units PM       Jardiance 25 mg Daily       Metformin 1000 mg BID  Current Orders: Novolog Moderate Correction Scale/ SSI (0-15 units) TID AC + HS       MD- Please consider starting 1/3 pt's total home dose of Levemir:  Levemir 15 units QHS     --Will follow patient during hospitalization--  Wyn Quaker RN, MSN, CDE Diabetes Coordinator Inpatient Glycemic Control Team Team Pager: [phone] (8a-5p)

## 2019-03-17 NOTE — Telephone Encounter (Signed)
Left message advising the patient of her upcoming ultra sounds and post op with the NP.  I mailed out remider letter and follow up paperwork

## 2019-03-17 NOTE — Progress Notes (Addendum)
  Progress Note    03/17/2019 7:23 AM 1 Day Post-Op  Subjective:  Feels better compared to last night   Vitals:   03/17/19 0344 03/17/19 0400  BP:  (!) 144/72  Pulse: 88 80  Resp: (!) 22 17  Temp: 97.9 F (36.6 C)   SpO2: 100% 100%   Physical Exam Lungs:  Non labored Incisions:  B groin cath sites without firm hematoma Extremities:  Palpable R DP; L foot warm with good cap refill; motor and sensation intact Abdomen:  Soft, ND, NT Neurologic: A&O  CBC    Component Value Date/Time   WBC 18.1 (H) 03/17/2019 0527   RBC 3.66 (L) 03/17/2019 0527   HGB 10.1 (L) 03/17/2019 0527   HCT 32.0 (L) 03/17/2019 0527   PLT 382 03/17/2019 0527   MCV 87.4 03/17/2019 0527   MCH 27.6 03/17/2019 0527   MCHC 31.6 03/17/2019 0527   RDW 16.5 (H) 03/17/2019 0527   LYMPHSABS 2.9 03/16/2019 1757   MONOABS 2.5 (H) 03/16/2019 1757   EOSABS 0.1 03/16/2019 1757   BASOSABS 0.2 (H) 03/16/2019 1757    BMET    Component Value Date/Time   NA 137 03/16/2019 1757   K 3.2 (L) 03/16/2019 1757   CL 101 03/16/2019 1757   CO2 20 (L) 03/16/2019 1757   GLUCOSE 218 (H) 03/16/2019 1757   BUN 16 03/16/2019 1757   CREATININE 0.73 03/16/2019 1757   CREATININE 0.61 09/03/2012 1008   CALCIUM 8.6 (L) 03/16/2019 1757   GFRNONAA >60 03/16/2019 1757   GFRNONAA >60 09/03/2012 1008   GFRAA >60 03/16/2019 1757   GFRAA >60 09/03/2012 1008    INR No results found for: INR   Intake/Output Summary (Last 24 hours) at 03/17/2019 0723 Last data filed at 03/17/2019 [id number] Gross per 24 hour  Intake 500 ml  Output 1650 ml  Net -1150 ml     Assessment/Plan:  53 y.o. Wu is s/p L iliac stent with perinephric hematoma perioperatively 1 Day Post-Op   Perfusing BLE well on exam Perinephric hematoma; BP stable this morning, Hgb down to 10 OOB with assistance Next CBC at 1200; ok for discharge if Hgb stable, ambulating without difficulty   Dagoberto Ligas, PA-C [facility] [phone]  03/17/2019 7:23 AM   I have independently interviewed and examined patient and agree with PA assessment and plan above. Recheck cbc this p.m.Marland Kitchen more comfortable today. Will likely require another day given downtrending h/h.   Mickey Hebel C. Donzetta Matters, MD [facility] [place] Office: [phone] Pager: [phone]

## 2019-03-18 DIAGNOSIS — I10 Essential (primary) hypertension: Secondary | ICD-10-CM | POA: Diagnosis not present

## 2019-03-18 DIAGNOSIS — L97529 Non-pressure chronic ulcer of other part of left foot with unspecified severity: Secondary | ICD-10-CM | POA: Diagnosis not present

## 2019-03-18 DIAGNOSIS — J45909 Unspecified asthma, uncomplicated: Secondary | ICD-10-CM | POA: Diagnosis not present

## 2019-03-18 DIAGNOSIS — Z794 Long term (current) use of insulin: Secondary | ICD-10-CM | POA: Diagnosis not present

## 2019-03-18 DIAGNOSIS — E11621 Type 2 diabetes mellitus with foot ulcer: Secondary | ICD-10-CM | POA: Diagnosis not present

## 2019-03-18 DIAGNOSIS — E1151 Type 2 diabetes mellitus with diabetic peripheral angiopathy without gangrene: Secondary | ICD-10-CM | POA: Diagnosis not present

## 2019-03-18 DIAGNOSIS — F17210 Nicotine dependence, cigarettes, uncomplicated: Secondary | ICD-10-CM | POA: Diagnosis not present

## 2019-03-18 DIAGNOSIS — L600 Ingrowing nail: Secondary | ICD-10-CM | POA: Diagnosis not present

## 2019-03-18 DIAGNOSIS — Z7982 Long term (current) use of aspirin: Secondary | ICD-10-CM | POA: Diagnosis not present

## 2019-03-18 DIAGNOSIS — K219 Gastro-esophageal reflux disease without esophagitis: Secondary | ICD-10-CM | POA: Diagnosis not present

## 2019-03-18 DIAGNOSIS — I70245 Atherosclerosis of native arteries of left leg with ulceration of other part of foot: Secondary | ICD-10-CM | POA: Diagnosis not present

## 2019-03-18 DIAGNOSIS — Z79899 Other long term (current) drug therapy: Secondary | ICD-10-CM | POA: Diagnosis not present

## 2019-03-18 DIAGNOSIS — M1990 Unspecified osteoarthritis, unspecified site: Secondary | ICD-10-CM | POA: Diagnosis not present

## 2019-03-18 LAB — CBC WITH DIFFERENTIAL/PLATELET
Abs Immature Granulocytes: 0.05 10*3/uL (ref 0.00–0.07)
Basophils Absolute: 0.1 10*3/uL (ref 0.0–0.1)
Basophils Relative: 0 %
Eosinophils Absolute: 0.3 10*3/uL (ref 0.0–0.5)
Eosinophils Relative: 3 %
HCT: 28.2 % — ABNORMAL LOW (ref 36.0–46.0)
Hemoglobin: 8.9 g/dL — ABNORMAL LOW (ref 12.0–15.0)
Immature Granulocytes: 0 %
Lymphocytes Relative: 22 %
Lymphs Abs: 2.5 10*3/uL (ref 0.7–4.0)
MCH: 27.6 pg (ref 26.0–34.0)
MCHC: 31.6 g/dL (ref 30.0–36.0)
MCV: 87.6 fL (ref 80.0–100.0)
Monocytes Absolute: 1.4 10*3/uL — ABNORMAL HIGH (ref 0.1–1.0)
Monocytes Relative: 12 %
Neutro Abs: 7.3 10*3/uL (ref 1.7–7.7)
Neutrophils Relative %: 63 %
Platelets: 324 10*3/uL (ref 150–400)
RBC: 3.22 MIL/uL — ABNORMAL LOW (ref 3.87–5.11)
RDW: 16.4 % — ABNORMAL HIGH (ref 11.5–15.5)
WBC: 11.6 10*3/uL — ABNORMAL HIGH (ref 4.0–10.5)
nRBC: 0 % (ref 0.0–0.2)

## 2019-03-18 LAB — CBC
HCT: 27 % — ABNORMAL LOW (ref 36.0–46.0)
Hemoglobin: 8.8 g/dL — ABNORMAL LOW (ref 12.0–15.0)
MCH: 28.2 pg (ref 26.0–34.0)
MCHC: 32.6 g/dL (ref 30.0–36.0)
MCV: 86.5 fL (ref 80.0–100.0)
Platelets: 320 10*3/uL (ref 150–400)
RBC: 3.12 MIL/uL — ABNORMAL LOW (ref 3.87–5.11)
RDW: 16.2 % — ABNORMAL HIGH (ref 11.5–15.5)
WBC: 12.9 10*3/uL — ABNORMAL HIGH (ref 4.0–10.5)
nRBC: 0 % (ref 0.0–0.2)

## 2019-03-18 LAB — GLUCOSE, CAPILLARY
Glucose-Capillary: 134 mg/dL — ABNORMAL HIGH (ref 70–99)
Glucose-Capillary: 260 mg/dL — ABNORMAL HIGH (ref 70–99)

## 2019-03-18 NOTE — Progress Notes (Signed)
Karen Wu to be D/C'd per MD order. Discussed with the patient and all questions fully answered. ? VSS, Skin clean, dry and intact without evidence of skin break down, no evidence of skin tears noted. ? IV catheter discontinued intact. Site without signs and symptoms of complications. Dressing and pressure applied. ? An After Visit Summary was printed and given to the patient. Patient informed where to pickup prescriptions. ? D/c education completed with patient/family including follow up instructions, medication list, d/c activities limitations if indicated, with other d/c instructions as indicated by MD - patient able to verbalize understanding, all questions fully answered.  ? Patient instructed to return to ED, call 911, or call MD for any changes in condition.  ? Patient to be escorted via [place], and D/C home via private auto.

## 2019-03-18 NOTE — Discharge Summary (Signed)
Physician Discharge Summary   Patient ID: Karen Wu [id number] 53 y.o. 28-Aug-1965  Admit date: 03/16/2019  Discharge date and time: 03/18/19   Admitting Physician: Waynetta Sandy, MD   Discharge Physician: same  Admission Diagnoses: Status post peripheral artery angioplasty with insertion of stent [Z95.820]  Discharge Diagnoses: PAD, toe ulcer  Admission Condition: fair  Discharged Condition: fair  Indication for Admission: perinephric hematoma s/p aortogram  Hospital Course:  Karen Wu is a 53 y.o female who came in as an outpatient for aortogram due to critical left lower extremity ischemia with great toe ulceration.  On 03/16/2019 she underwent left common iliac artery stent graft with Dr. Donzetta Matters.  Postoperatively due to back pain a CAT scan was ordered which found a large left perinephric hematoma.  She was admitted to the hospital to monitor her hemoglobin hematocrit.  POD #1 patient was still feeling weak however back pain had improved.  Downward trend of hemoglobin slowed.  POD #2 hemoglobin stabilized.  Patient is feeling much better and would like to go home.  She will follow-up in office in about 4 to 6 weeks with a iliac duplex as well as ABIs.  She will call/return office sooner if left great toe ulceration worsens.  She has been started on Plavix and prescription provided.  Discharge instructions were reviewed with the patient and she voiced her understanding.  She will be discharged home in stable condition.  Consults: None  Treatments: surgery: L CIA stent graft by Dr. Donzetta Matters on 03/16/19  Discharge Exam: see progress note 03/18/19 Vitals:   03/18/19 0813 03/18/19 1150  BP: 130/75 121/67  Pulse: 79 76  Resp: 16 18  Temp: 98 F (36.7 C) 98.3 F (36.8 C)  SpO2: 96% 92%     Disposition: Discharge disposition: 01-Home or Self Care       Patient Instructions:  Allergies as of 03/18/2019      Reactions   Cortisone Shortness Of Breath      Medication List     TAKE these medications   amLODipine 5 MG tablet Commonly known as: NORVASC Take 5 mg by mouth daily.   aspirin EC 81 MG tablet Take 81 mg by mouth every evening.   clopidogrel 75 MG tablet Commonly known as: Plavix Take 1 tablet (75 mg total) by mouth daily.   empagliflozin 25 MG Tabs tablet Commonly known as: JARDIANCE Take 25 mg by mouth daily.   famotidine 40 MG tablet Commonly known as: PEPCID Take 40 mg by mouth at bedtime.   folic acid 1 MG tablet Commonly known as: FOLVITE Take 3 mg by mouth at bedtime.   gabapentin 300 MG capsule Commonly known as: NEURONTIN Take 300 mg by mouth 2 (two) times daily.   Humira Pen 40 MG/0.4ML Pnkt Generic drug: Adalimumab Inject 40 mg into the skin every 14 (fourteen) days. Fridays.   hydrochlorothiazide 25 MG tablet Commonly known as: HYDRODIURIL Take 25 mg by mouth daily.   HYDROcodone-acetaminophen 5-325 MG tablet Commonly known as: NORCO/VICODIN Take 1 tablet by mouth every 4 (four) hours as needed. What changed: reasons to take this   irbesartan 150 MG tablet Commonly known as: AVAPRO Take 150 mg by mouth daily.   Levemir FlexTouch 100 UNIT/ML Pen Generic drug: Insulin Detemir Inject 5-45 Units into the skin See admin instructions. Inject 5 units in the morning & 45 units at bedtime   metFORMIN 1000 MG tablet Commonly known as: GLUCOPHAGE Take 1,000 mg by mouth 2 (two) times  daily with a meal.   methotrexate 50 MG/2ML injection Inject 25 mg into the skin every Sunday.   oxybutynin 10 MG 24 hr tablet Commonly known as: DITROPAN-XL Take 10 mg by mouth at bedtime.   Pennsaid 2 % Soln Generic drug: Diclofenac Sodium Apply 1 application topically 2 (two) times daily as needed (hip pain.).   ProAir RespiClick [id number] (90 Base) MCG/ACT Aepb Generic drug: Albuterol Sulfate Inhale 1-2 puffs into the lungs 3 (three) times daily as needed (wheezing/shortness of breath).   rosuvastatin 20 MG tablet Commonly known  as: CRESTOR Take 20 mg by mouth at bedtime.   traMADol 50 MG tablet Commonly known as: ULTRAM Take 50 mg by mouth 3 (three) times daily as needed.   Venlafaxine HCl 225 MG Tb24 Take 225 mg by mouth at bedtime.      Activity: activity as tolerated Diet: regular diet Wound Care: none needed  Follow-up with Dr. Donzetta Matters in 6 weeks.  SignedDagoberto Ligas 03/18/2019 3:19 PM

## 2019-03-18 NOTE — Progress Notes (Addendum)
  Progress Note    03/18/2019 7:38 AM 2 Days Post-Op  Subjective:  Feeling better this morning.  Still with back pain however much better overall.   Vitals:   03/18/19 0314 03/18/19 0617  BP:  132/63  Pulse:  72  Resp: 18 20  Temp:  98.5 F (36.9 C)  SpO2: 100% 99%   Physical Exam: Lungs:  Non labored Incisions:  B groin cath sites without hematoma Extremities:  R AT palpable L foot warm and well perfused Neurologic: A&O  CBC    Component Value Date/Time   WBC 11.6 (H) 03/18/2019 0302   RBC 3.22 (L) 03/18/2019 0302   HGB 8.9 (L) 03/18/2019 0302   HCT 28.2 (L) 03/18/2019 0302   PLT 324 03/18/2019 0302   MCV 87.6 03/18/2019 0302   MCH 27.6 03/18/2019 0302   MCHC 31.6 03/18/2019 0302   RDW 16.4 (H) 03/18/2019 0302   LYMPHSABS 2.5 03/18/2019 0302   MONOABS 1.4 (H) 03/18/2019 0302   EOSABS 0.3 03/18/2019 0302   BASOSABS 0.1 03/18/2019 0302    BMET    Component Value Date/Time   NA 137 03/16/2019 1757   K 3.2 (L) 03/16/2019 1757   CL 101 03/16/2019 1757   CO2 20 (L) 03/16/2019 1757   GLUCOSE 218 (H) 03/16/2019 1757   BUN 16 03/16/2019 1757   CREATININE 0.73 03/16/2019 1757   CREATININE 0.61 09/03/2012 1008   CALCIUM 8.6 (L) 03/16/2019 1757   GFRNONAA >60 03/16/2019 1757   GFRNONAA >60 09/03/2012 1008   GFRAA >60 03/16/2019 1757   GFRAA >60 09/03/2012 1008    INR No results found for: INR   Intake/Output Summary (Last 24 hours) at 03/18/2019 0738 Last data filed at 03/18/2019 0300 Gross per 24 hour  Intake 590 ml  Output -  Net 590 ml     Assessment/Plan:  53 y.o. female is s/p L iliac stent; perinephric hematoma 2 Days Post-Op   Perfusing BLE well; groin cath sites without firm hematoma Hgb dropped 10.1 to 8.9; check CBC in 6h Home when Hgb stable   Dagoberto Ligas, PA-C [facility] [phone] 03/18/2019 7:38 AM   I have independently interviewed and patient and agree with PA assessment and plan above.  She appears to be  quite comfortable today.  Did have a drop in her H&H overnight.  Groins did not appear to have any significant hematoma.  Both feet are warm and well-perfused.  We will recheck CBC and possibly discharge after.  Benedicto Capozzi C. Donzetta Matters, MD [facility] [place] Office: [phone] Pager: [phone]

## 2019-03-18 NOTE — Progress Notes (Signed)
Pharmacy was requested to add medications to the PTA med list. Per notes, the patient reported she has not been getting some of her home meds .  A thorough med history was obtained prior to admission. It does not appear medication reconciliation was performed on admission. Home regimens of gabapentin and venlafaxine were started last night. This was communicated to Dagoberto Ligas PA via [place].  Romeo Rabon, PharmD. Mobile: [phone]. 03/18/2019,2:40 PM.

## 2019-03-18 NOTE — Significant Event (Signed)
Rapid Response Event Note  I came by to see Karen Wu this morning at 1000, she was alert and oriented, laying on her side. She endorsed that she still has some mild discomfort in the back but she said that it is much better than before. VS are stable. Pending repeat H/H. She asked about getting some of her maintenance medications restarted and if should come ambulate in the hallway.    I informed the 4E charge nurse to inform the primary nurse about the following  -- Call Pharmacy Tech to update PTA medications for MD can order them  -- Call Lab Tech to draw repeat H/H -- Address mobility orders    NO RRT INTERVENTIONS   Start Time 1000  End Time [place], [place]

## 2019-03-20 ENCOUNTER — Encounter: Payer: Self-pay | Admitting: *Deleted

## 2019-03-25 ENCOUNTER — Ambulatory Visit (INDEPENDENT_AMBULATORY_CARE_PROVIDER_SITE_OTHER): Payer: Federal, State, Local not specified - PPO | Admitting: Physician Assistant

## 2019-03-25 ENCOUNTER — Other Ambulatory Visit: Payer: Self-pay

## 2019-03-25 ENCOUNTER — Encounter: Payer: Self-pay | Admitting: Physician Assistant

## 2019-03-25 VITALS — BP 114/79 | HR 97 | Temp 98.1°F | Resp 14 | Ht 65.0 in | Wt 169.5 lb

## 2019-03-25 DIAGNOSIS — L97521 Non-pressure chronic ulcer of other part of left foot limited to breakdown of skin: Secondary | ICD-10-CM | POA: Insufficient documentation

## 2019-03-25 DIAGNOSIS — Z95820 Peripheral vascular angioplasty status with implants and grafts: Secondary | ICD-10-CM | POA: Diagnosis not present

## 2019-03-25 MED ORDER — CEPHALEXIN 500 MG PO CAPS: 500.0000 mg | ORAL_CAPSULE | Freq: Three times a day (TID) | ORAL | 0 refills | Status: DC

## 2019-03-25 NOTE — Progress Notes (Signed)
Postoperative Visit ([place])   History of Present Illness   Karen Wu is a 53 y.o. female who presents cc:  Purulent drainage L great toe.  Prior procedure include: L common iliac artery stent graft by Dr. Donzetta Matters 03/16/2019  Patient reportedly had some purulent drainage from left great toe this morning.  She states that this has resolved and she does not see any open areas that this may have came from.  She denies any claudication or rest pain.  She has no plans to return to her podiatrist because she states that they have not returned her phone calls.  She would prefer for [place] to monitor her left toe wound.  Patient states she stopped smoking since iliac stent procedure.  She admittedly needs to get her insulin-dependent diabetes under better control.  She is taking Plavix and aspirin daily.  Current Outpatient Medications  Medication Sig Dispense Refill  . Albuterol Sulfate (PROAIR RESPICLICK) 123XX123 (90 Base) MCG/ACT AEPB Inhale 1-2 puffs into the lungs 3 (three) times daily as needed (wheezing/shortness of breath).     Marland Kitchen amLODipine (NORVASC) 5 MG tablet Take 5 mg by mouth daily.    Marland Kitchen aspirin EC 81 MG tablet Take 81 mg by mouth every evening.    . clopidogrel (PLAVIX) 75 MG tablet Take 1 tablet (75 mg total) by mouth daily. 30 tablet 11  . empagliflozin (JARDIANCE) 25 MG TABS tablet Take 25 mg by mouth daily.     . famotidine (PEPCID) 40 MG tablet Take 40 mg by mouth at bedtime.     . folic acid (FOLVITE) 1 MG tablet Take 3 mg by mouth at bedtime.     . gabapentin (NEURONTIN) 300 MG capsule Take 300 mg by mouth 2 (two) times daily.     Marland Kitchen HUMIRA PEN 40 MG/0.4ML PNKT Inject 40 mg into the skin every 14 (fourteen) days. Fridays.    . hydrochlorothiazide (HYDRODIURIL) 25 MG tablet Take 25 mg by mouth daily.    Marland Kitchen HYDROcodone-acetaminophen (NORCO/VICODIN) 5-325 MG tablet Take 1 tablet by mouth every 4 (four) hours as needed. (Patient taking differently: Take 1 tablet by mouth every 4 (four)  hours as needed (pain.). ) 10 tablet 0  . irbesartan (AVAPRO) 150 MG tablet Take 150 mg by mouth daily.    Marland Kitchen LEVEMIR FLEXTOUCH 100 UNIT/ML Pen Inject 5-45 Units into the skin See admin instructions. Inject 5 units in the morning & 45 units at bedtime    . metFORMIN (GLUCOPHAGE) 1000 MG tablet Take 1,000 mg by mouth 2 (two) times daily with a meal.    . methotrexate 50 MG/2ML injection Inject 25 mg into the skin every Sunday.    Marland Kitchen oxybutynin (DITROPAN-XL) 10 MG 24 hr tablet Take 10 mg by mouth at bedtime.     Marland Kitchen PENNSAID 2 % SOLN Apply 1 application topically 2 (two) times daily as needed (hip pain.).    Marland Kitchen rosuvastatin (CRESTOR) 20 MG tablet Take 20 mg by mouth at bedtime.     . traMADol (ULTRAM) 50 MG tablet Take 50 mg by mouth 3 (three) times daily as needed.    . Venlafaxine HCl 225 MG TB24 Take 225 mg by mouth at bedtime.    . cephALEXin (KEFLEX) 500 MG capsule Take 1 capsule (500 mg total) by mouth 3 (three) times daily. 42 capsule 0   No current facility-administered medications for this visit.     ROS: 10 system ROS is negative unless otherwise noted in HPI  For VQI Use Only   PRE-ADM LIVING: Home  AMB STATUS: Ambulatory   Physical Examination   Vitals:   03/25/19 1514  BP: 114/79  Pulse: 97  Resp: 14  Temp: 98.1 F (36.7 C)  TempSrc: Temporal  SpO2: 97%  Weight: 169 lb 8 oz (76.9 kg)  Height: 5\' 5"  (1.651 m)   Body mass index is 28.21 kg/m.  General Alert, O x 3, WD, NAD  Pulmonary Sym exp, good B air movt, CTA B  Cardiac  regular rate and rhythm  Vascular Vessel Right Left  Radial Palpable Palpable  Aorta Not palpable N/A  Femoral Palpable Palpable  Popliteal Not palpable Not palpable  PT Not assessed Palpable  DP Not assessed Brisk by Doppler    Gastrointestinal soft, non-distended, non-tender to palpation,   Musculoskeletal M/S 5/5 throughout  , ; Left great toe with low intensity erythema, toe tip with dusky area however no open wound  Neurologic  Pain  and light touch intact in extremities , Motor exam as listed above    Medical Decision Making   Karen Wu is a 53 y.o. female who presents s/p left common iliac artery VBX stent placement due to left great toe ulceration   Iliac stent patent with a palpable left posterior tibial pulse and brisk DP signal by Doppler  No open wound left great toe and no further purulent drainage noted; if open wound develops or if drainage persists, recommended cleansing toe with antimicrobial soap followed by keeping toe dry at least twice a day  Two-week Keflex course prescribed  Stressed importance of continued smoking cessation  Continue aspirin, Plavix, and statin daily  Follow-up with Dr. Donzetta Matters or APP in several weeks with imaging and to recheck left great toe  Patient is aware that despite improved blood flow she still may require toe amputation in the future if wound is related to small vessel disease commonly seen in insulin-dependent diabetics   Dagoberto Ligas PA-C [facility] Office: [phone]  Clinic MD: Dr. Oneida Alar

## 2019-03-26 ENCOUNTER — Encounter: Payer: Federal, State, Local not specified - PPO | Admitting: Vascular Surgery

## 2019-03-31 ENCOUNTER — Other Ambulatory Visit: Payer: Self-pay

## 2019-03-31 DIAGNOSIS — I739 Peripheral vascular disease, unspecified: Secondary | ICD-10-CM

## 2019-03-31 DIAGNOSIS — Z95820 Peripheral vascular angioplasty status with implants and grafts: Secondary | ICD-10-CM

## 2019-04-02 ENCOUNTER — Encounter (HOSPITAL_COMMUNITY): Payer: Federal, State, Local not specified - PPO

## 2019-04-02 ENCOUNTER — Ambulatory Visit (INDEPENDENT_AMBULATORY_CARE_PROVIDER_SITE_OTHER)
Admission: RE | Admit: 2019-04-02 | Discharge: 2019-04-02 | Disposition: A | Discharge status: Home / Self Care | Payer: Federal, State, Local not specified - PPO | Source: Ambulatory Visit | Attending: Vascular Surgery | Admitting: Vascular Surgery

## 2019-04-02 ENCOUNTER — Ambulatory Visit (HOSPITAL_COMMUNITY)
Admission: RE | Admit: 2019-04-02 | Discharge: 2019-04-02 | Disposition: A | Discharge status: Home / Self Care | Payer: Federal, State, Local not specified - PPO | Source: Ambulatory Visit | Attending: Vascular Surgery | Admitting: Vascular Surgery

## 2019-04-02 ENCOUNTER — Encounter: Payer: Self-pay | Admitting: Allergy and Immunology

## 2019-04-02 ENCOUNTER — Other Ambulatory Visit: Payer: Self-pay

## 2019-04-02 ENCOUNTER — Ambulatory Visit (INDEPENDENT_AMBULATORY_CARE_PROVIDER_SITE_OTHER): Payer: Federal, State, Local not specified - PPO | Admitting: Allergy and Immunology

## 2019-04-02 VITALS — BP 118/62 | HR 84 | Temp 98.5°F | Resp 18 | Ht 64.7 in | Wt 176.8 lb

## 2019-04-02 DIAGNOSIS — T50905D Adverse effect of unspecified drugs, medicaments and biological substances, subsequent encounter: Secondary | ICD-10-CM

## 2019-04-02 DIAGNOSIS — I739 Peripheral vascular disease, unspecified: Secondary | ICD-10-CM

## 2019-04-02 DIAGNOSIS — Z95820 Peripheral vascular angioplasty status with implants and grafts: Secondary | ICD-10-CM | POA: Insufficient documentation

## 2019-04-02 NOTE — Progress Notes (Signed)
[place] - [place] - [place] - [place] - [place]   Dear Dr. Lin Landsman,  Thank you for referring Karen Wu to the [facility] on 04/02/2019.   Below is a summation of this patient's evaluation and recommendations.  Thank you for your referral. I will keep you informed about this patient's response to treatment.   If you have any questions please do not hesitate to contact me.   Sincerely,  Jiles Prows, MD Allergy / Immunology [facility]   ______________________________________________________________________    NEW PATIENT NOTE  Referring Provider: Angelina Sheriff, MD Primary Provider: Angelina Sheriff, MD Date of office visit: 04/02/2019    Subjective:   Chief Complaint:  Karen Wu (DOB: 1965/08/26) is a 53 y.o. female who presents to the clinic on 04/02/2019 with a chief complaint of Medication Reaction (Possible reaction to Kenalog) .     HPI: Karen Wu presents to this clinic in evaluation of drug allergy.  Approximately 11 years ago she was given an injection of Kenalog into her right shoulder.  There may have been other substances contained within that injection as well.  Within 30 minutes she developed this itchiness in her throat and a scratchy throat and burning in her throat and she started grabbing her neck and rubbing her neck and it became somewhat red.  She feels as though her air cut off and she had problems breathing.  EMT was contacted and transported her to the emergency room where she stayed there for a few hours.  She had no other associated systemic or constitutional symptoms.  She has had administration of prednisone since that point in time without any difficulty at all.  She has been to the dentist multiple times and has had procedures performed that is required injectable topical anesthetic with no problem at all.  Past Medical History:  Diagnosis  Date  . Arthritis    hips, knees, lower back  . Asthma    rare inhaler use  . Brain tumor ([place])    x 2 - appear to be meningiomas, per neurosurgeon's report  . Dental crown present   . Diabetes ([place])   . Family history of adverse reaction to anesthesia    pt's father woke up during surgery; pt's mother has hx. of being hard to wake up post-op  . GERD (gastroesophageal reflux disease)    OTC as needed  . Graves' disease    no current med.  Marland Kitchen Heart murmur    as a child, states no problems  . History of anemia    no problems since endometrial ablation  . Hypertension    under control with meds., per pt.; has been on med. x 21 yr.  . Insulin dependent diabetes mellitus ([place])   . Trigger finger of right hand 10/2014   thumb and long finger    Past Surgical History:  Procedure Laterality Date  . ABDOMINAL AORTOGRAM W/LOWER EXTREMITY Bilateral 03/16/2019   Procedure: ABDOMINAL AORTOGRAM W/LOWER EXTREMITY;  Surgeon: Waynetta Sandy, MD;  Location: [facility];  Service: Cardiovascular;  Laterality: Bilateral;  . BLADDER SUSPENSION  01/2014  . BREAST MASS EXCISION Left 05/30/2005  . ENDOMETRIAL ABLATION  01/2014  . FOOT SURGERY Left    exc. cyst and bone fragments  . MICROLARYNGOSCOPY WITH CO2 LASER AND EXCISION OF VOCAL CORD LESION  03/2014  . PERIPHERAL VASCULAR INTERVENTION Left 03/16/2019  Procedure: PERIPHERAL VASCULAR INTERVENTION;  Surgeon: Waynetta Sandy, MD;  Location: [facility];  Service: Cardiovascular;  Laterality: Left;  Iliac  . SHOULDER ARTHROSCOPY WITH ROTATOR CUFF REPAIR Right   . TONSILLECTOMY AND ADENOIDECTOMY    . TRIGGER FINGER RELEASE Right 10/28/2014   Procedure: RIGHT THUMB AND LONG FINGER RELEASE TRIGGER ;  Surgeon: Leanora Cover, MD;  Location: [facility];  Service: Orthopedics;  Laterality: Right;  . TUBAL LIGATION    . UMBILICAL HERNIA REPAIR      Allergies as of 04/02/2019      Reactions   Cortisone  Shortness Of Breath      Medication List      amLODipine 5 MG tablet Commonly known as: NORVASC Take 5 mg by mouth daily.   aspirin EC 81 MG tablet Take 81 mg by mouth every evening.   cephALEXin 500 MG capsule Commonly known as: KEFLEX Take 1 capsule (500 mg total) by mouth 3 (three) times daily.   clopidogrel 75 MG tablet Commonly known as: Plavix Take 1 tablet (75 mg total) by mouth daily.   empagliflozin 25 MG Tabs tablet Commonly known as: JARDIANCE Take 25 mg by mouth daily.   famotidine 40 MG tablet Commonly known as: PEPCID Take 40 mg by mouth at bedtime.   folic acid 1 MG tablet Commonly known as: FOLVITE Take 3 mg by mouth at bedtime.   gabapentin 300 MG capsule Commonly known as: NEURONTIN Take 300 mg by mouth 2 (two) times daily.   Humira Pen 40 MG/0.4ML Pnkt Generic drug: Adalimumab Inject 40 mg into the skin every 14 (fourteen) days. Fridays.   hydrochlorothiazide 25 MG tablet Commonly known as: HYDRODIURIL Take 25 mg by mouth daily.   HYDROcodone-acetaminophen 5-325 MG tablet Commonly known as: NORCO/VICODIN Take 1 tablet by mouth every 4 (four) hours as needed. What changed: reasons to take this   irbesartan 150 MG tablet Commonly known as: AVAPRO Take 150 mg by mouth daily.   Levemir FlexTouch 100 UNIT/ML Pen Generic drug: Insulin Detemir Inject 5-45 Units into the skin See admin instructions. Inject 5 units in the morning & 45 units at bedtime   metFORMIN 1000 MG tablet Commonly known as: GLUCOPHAGE Take 1,000 mg by mouth 2 (two) times daily with a meal.   methotrexate 50 MG/2ML injection Inject 25 mg into the skin every Sunday.   oxybutynin 10 MG 24 hr tablet Commonly known as: DITROPAN-XL Take 10 mg by mouth at bedtime.   Pennsaid 2 % Soln Generic drug: Diclofenac Sodium Apply 1 application topically 2 (two) times daily as needed (hip pain.).   ProAir RespiClick 123XX123 (90 Base) MCG/ACT Aepb Generic drug: Albuterol Sulfate  Inhale 1-2 puffs into the lungs 3 (three) times daily as needed (wheezing/shortness of breath).   rosuvastatin 20 MG tablet Commonly known as: CRESTOR Take 20 mg by mouth at bedtime.   traMADol 50 MG tablet Commonly known as: ULTRAM Take 50 mg by mouth 3 (three) times daily as needed.   Venlafaxine HCl 225 MG Tb24 Take 225 mg by mouth at bedtime.       Review of systems negative except as noted in HPI / PMHx or noted below:  Review of Systems  Constitutional: Negative.   HENT: Negative.   Eyes: Negative.   Respiratory: Negative.   Cardiovascular: Negative.   Gastrointestinal: Negative.   Genitourinary: Negative.   Musculoskeletal: Negative.   Skin: Negative.   Neurological: Negative.   Endo/Heme/Allergies: Negative.   Psychiatric/Behavioral:  Negative.     Family History  Problem Relation Age of Onset  . Anesthesia problems Mother        hard to wake up post-op  . Heart disease Mother   . Kidney failure Mother   . Stroke Mother   . Heart attack Mother   . Diabetes Mother   . Anesthesia problems Father        woke up during surgery  . Diabetes Father   . High blood pressure Father   . Kidney failure Maternal Grandmother   . Heart disease Maternal Grandfather   . Diabetes Maternal Grandfather   . Stroke Paternal Grandmother     Social History   Socioeconomic History  . Marital status: Married    Spouse name: Not on file  . Number of children: Not on file  . Years of education: Not on file  . Highest education level: Not on file  Occupational History  . Not on file  Social Needs  . Financial resource strain: Not on file  . Food insecurity    Worry: Not on file    Inability: Not on file  . Transportation needs    Medical: Not on file    Non-medical: Not on file  Tobacco Use  . Smoking status: Former Smoker    Packs/day: 1.50    Years: 30.00    Pack years: 45.00    Types: Cigarettes    Quit date: 02/2019    Years since quitting: 0.1  .  Smokeless tobacco: Never Used  Substance and Sexual Activity  . Alcohol use: No  . Drug use: No  . Sexual activity: Not on file  Lifestyle  . Physical activity    Days per week: Not on file    Minutes per session: Not on file  . Stress: Not on file  Relationships  . Social Herbalist on phone: Not on file    Gets together: Not on file    Attends religious service: Not on file    Active member of club or organization: Not on file    Attends meetings of clubs or organizations: Not on file    Relationship status: Not on file  . Intimate partner violence    Fear of current or ex partner: Not on file    Emotionally abused: Not on file    Physically abused: Not on file    Forced sexual activity: Not on file  Other Topics Concern  . Not on file  Social History Narrative  . 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  Objective:   Vitals:   04/02/19 1409  BP: 118/62  Pulse: 84  Resp: 18  Temp: 98.5 F (36.9 C)  SpO2: 97%   Height: 5' 4.7" (164.3 cm) Weight: 176 lb 12.8 oz (80.2 kg)  Physical Exam Constitutional:      Appearance: She is not diaphoretic.  HENT:     Head: Normocephalic.     Right Ear: Tympanic membrane, ear canal and external ear normal.     Left Ear: Tympanic membrane, ear canal and external ear normal.     Nose: Nose normal. No mucosal edema or rhinorrhea.     Mouth/Throat:     Pharynx: Uvula  midline. No oropharyngeal exudate.  Eyes:     Conjunctiva/sclera: Conjunctivae normal.  Neck:     Thyroid: No thyromegaly.     Trachea: Trachea normal. No tracheal tenderness or tracheal deviation.  Cardiovascular:     Rate and Rhythm: Normal rate and regular rhythm.     Heart sounds: Normal heart sounds, S1 normal and  S2 normal. No murmur.  Pulmonary:     Effort: No respiratory distress.     Breath sounds: Normal breath sounds. No stridor. No wheezing or rales.  Lymphadenopathy:     Head:     Right side of head: No tonsillar adenopathy.     Left side of head: No tonsillar adenopathy.     Cervical: No cervical adenopathy.  Skin:    Findings: No erythema or rash.     Nails: There is no clubbing.   Neurological:     Mental Status: She is alert.     Diagnostics: Allergy skin tests were not performed.   Assessment and Plan:    1. Adverse effect of drug, subsequent encounter     We will have Karen Wu undergo a incremental challenge protocol with injectable triamcinolone sometime within the next week or 2.  If that exposure does not lead to the development of an allergic reaction then she should be fine to receive an intra-articular injection.  Jiles Prows, MD Allergy / Immunology [facility]

## 2019-04-03 ENCOUNTER — Other Ambulatory Visit (HOSPITAL_COMMUNITY)
Admission: RE | Admit: 2019-04-03 | Discharge: 2019-04-03 | Disposition: A | Discharge status: Home / Self Care | Payer: Federal, State, Local not specified - PPO | Source: Ambulatory Visit | Attending: Surgery | Admitting: Surgery

## 2019-04-03 ENCOUNTER — Encounter: Payer: Federal, State, Local not specified - PPO | Admitting: Vascular Surgery

## 2019-04-03 ENCOUNTER — Other Ambulatory Visit: Payer: Self-pay | Admitting: *Deleted

## 2019-04-03 ENCOUNTER — Encounter: Payer: Self-pay | Admitting: Vascular Surgery

## 2019-04-03 ENCOUNTER — Ambulatory Visit (INDEPENDENT_AMBULATORY_CARE_PROVIDER_SITE_OTHER): Payer: Federal, State, Local not specified - PPO | Admitting: Vascular Surgery

## 2019-04-03 ENCOUNTER — Encounter: Payer: Self-pay | Admitting: *Deleted

## 2019-04-03 VITALS — BP 125/76 | HR 76 | Temp 97.6°F | Resp 16 | Ht 65.0 in | Wt 173.0 lb

## 2019-04-03 DIAGNOSIS — Z01812 Encounter for preprocedural laboratory examination: Secondary | ICD-10-CM | POA: Insufficient documentation

## 2019-04-03 DIAGNOSIS — Z20828 Contact with and (suspected) exposure to other viral communicable diseases: Secondary | ICD-10-CM | POA: Insufficient documentation

## 2019-04-03 DIAGNOSIS — I739 Peripheral vascular disease, unspecified: Secondary | ICD-10-CM | POA: Insufficient documentation

## 2019-04-03 LAB — SARS CORONAVIRUS 2 (TAT 6-24 HRS): SARS Coronavirus 2: NEGATIVE

## 2019-04-03 NOTE — Progress Notes (Signed)
Patient ID: STELLE STROME, female   DOB: Oct 16, 1965, 53 y.o.   MRN: [id number]  Reason for Consult: PAD   Referred by Angelina Sheriff, MD  Subjective:     HPI:  KRYSTI DEIST is a 53 y.o. female follows up after left common iliac artery stent.  She has been taking her Plavix walking 1.9 miles per day.  States that her left great is feeling much better.  She had noninvasive studies performed yesterday for today's visit.  Past Medical History:  Diagnosis Date  . Arthritis    hips, knees, lower back  . Asthma    rare inhaler use  . Brain tumor ([place])    x 2 - appear to be meningiomas, per neurosurgeon's report  . Dental crown present   . Diabetes ([place])   . Family history of adverse reaction to anesthesia    pt's father woke up during surgery; pt's mother has hx. of being hard to wake up post-op  . GERD (gastroesophageal reflux disease)    OTC as needed  . Graves' disease    no current med.  Marland Kitchen Heart murmur    as a child, states no problems  . History of anemia    no problems since endometrial ablation  . Hypertension    under control with meds., per pt.; has been on med. x 21 yr.  . Insulin dependent diabetes mellitus ([place])   . Trigger finger of right hand 10/2014   thumb and long finger   Family History  Problem Relation Age of Onset  . Anesthesia problems Mother        hard to wake up post-op  . Heart disease Mother   . Kidney failure Mother   . Stroke Mother   . Heart attack Mother   . Diabetes Mother   . Anesthesia problems Father        woke up during surgery  . Diabetes Father   . High blood pressure Father   . Kidney failure Maternal Grandmother   . Heart disease Maternal Grandfather   . Diabetes Maternal Grandfather   . Stroke Paternal Grandmother    Past Surgical History:  Procedure Laterality Date  . ABDOMINAL AORTOGRAM W/LOWER EXTREMITY Bilateral 03/16/2019   Procedure: ABDOMINAL AORTOGRAM W/LOWER EXTREMITY;  Surgeon: Waynetta Sandy, MD;   Location: [facility];  Service: Cardiovascular;  Laterality: Bilateral;  . BLADDER SUSPENSION  01/2014  . BREAST MASS EXCISION Left 05/30/2005  . ENDOMETRIAL ABLATION  01/2014  . FOOT SURGERY Left    exc. cyst and bone fragments  . MICROLARYNGOSCOPY WITH CO2 LASER AND EXCISION OF VOCAL CORD LESION  03/2014  . PERIPHERAL VASCULAR INTERVENTION Left 03/16/2019   Procedure: PERIPHERAL VASCULAR INTERVENTION;  Surgeon: Waynetta Sandy, MD;  Location: [facility];  Service: Cardiovascular;  Laterality: Left;  Iliac  . SHOULDER ARTHROSCOPY WITH ROTATOR CUFF REPAIR Right   . TONSILLECTOMY AND ADENOIDECTOMY    . TRIGGER FINGER RELEASE Right 10/28/2014   Procedure: RIGHT THUMB AND LONG FINGER RELEASE TRIGGER ;  Surgeon: Leanora Cover, MD;  Location: [facility];  Service: Orthopedics;  Laterality: Right;  . TUBAL LIGATION    . UMBILICAL HERNIA REPAIR      Short Social History:  Social History   Tobacco Use  . Smoking status: Former Smoker    Packs/day: 1.50    Years: 30.00    Pack years: 45.00    Types: Cigarettes    Quit  date: 02/2019    Years since quitting: 0.1  . Smokeless tobacco: Never Used  Substance Use Topics  . Alcohol use: No    Allergies  Allergen Reactions  . Cortisone Shortness Of Breath    Current Outpatient Medications  Medication Sig Dispense Refill  . Albuterol Sulfate (PROAIR RESPICLICK) 123XX123 (90 Base) MCG/ACT AEPB Inhale 1-2 puffs into the lungs 3 (three) times daily as needed (wheezing/shortness of breath).     Marland Kitchen amLODipine (NORVASC) 5 MG tablet Take 5 mg by mouth daily.    Marland Kitchen aspirin EC 81 MG tablet Take 81 mg by mouth every evening.    . cephALEXin (KEFLEX) 500 MG capsule Take 1 capsule (500 mg total) by mouth 3 (three) times daily. 42 capsule 0  . clopidogrel (PLAVIX) 75 MG tablet Take 1 tablet (75 mg total) by mouth daily. 30 tablet 11  . empagliflozin (JARDIANCE) 25 MG TABS tablet Take 25 mg by mouth daily.     . famotidine  (PEPCID) 40 MG tablet Take 40 mg by mouth at bedtime.     . folic acid (FOLVITE) 1 MG tablet Take 3 mg by mouth at bedtime.     . gabapentin (NEURONTIN) 300 MG capsule Take 300 mg by mouth 2 (two) times daily.     Marland Kitchen HUMIRA PEN 40 MG/0.4ML PNKT Inject 40 mg into the skin every 14 (fourteen) days. Fridays.    . hydrochlorothiazide (HYDRODIURIL) 25 MG tablet Take 25 mg by mouth daily.    . irbesartan (AVAPRO) 150 MG tablet Take 150 mg by mouth daily.    Marland Kitchen LEVEMIR FLEXTOUCH 100 UNIT/ML Pen Inject 5-45 Units into the skin See admin instructions. Inject 5 units in the morning & 45 units at bedtime    . metFORMIN (GLUCOPHAGE) 1000 MG tablet Take 1,000 mg by mouth 2 (two) times daily with a meal.    . methotrexate 50 MG/2ML injection Inject 25 mg into the skin every Sunday.    Marland Kitchen oxybutynin (DITROPAN-XL) 10 MG 24 hr tablet Take 10 mg by mouth at bedtime.     Marland Kitchen PENNSAID 2 % SOLN Apply 1 application topically 2 (two) times daily as needed (hip pain.).    Marland Kitchen rosuvastatin (CRESTOR) 20 MG tablet Take 20 mg by mouth at bedtime.     . traMADol (ULTRAM) 50 MG tablet Take 50 mg by mouth 3 (three) times daily as needed.    . Venlafaxine HCl 225 MG TB24 Take 225 mg by mouth at bedtime.    Marland Kitchen HYDROcodone-acetaminophen (NORCO/VICODIN) 5-325 MG tablet Take 1 tablet by mouth every 4 (four) hours as needed. (Patient not taking: Reported on 04/03/2019) 10 tablet 0   No current facility-administered medications for this visit.     Review of Systems  Constitutional:  Constitutional negative. HENT: HENT negative.  Eyes: Eyes negative.  Respiratory: Respiratory negative.  Cardiovascular: Cardiovascular negative.  GI: Gastrointestinal negative.  Musculoskeletal: Musculoskeletal negative.  Skin: Positive for wound.  Neurological: Neurological negative. Hematologic: Hematologic/lymphatic negative.  Psychiatric: Psychiatric negative.        Objective:  Objective   Vitals:   04/03/19 0822  BP: 125/76  Pulse: 76   Resp: 16  Temp: 97.6 F (36.4 C)  TempSrc: Temporal  SpO2: 99%  Weight: 173 lb (78.5 kg)  Height: 5\' 5"  (1.651 m)   Body mass index is 28.79 kg/m.  Physical Exam HENT:     Head: Normocephalic.     Nose: Nose normal.  Eyes:     Pupils: Pupils  are equal, round, and reactive to light.  Cardiovascular:     Pulses:          Femoral pulses are 2+ on the right side and 0 on the left side.      Popliteal pulses are 2+ on the right side and 0 on the left side.       Dorsalis pedis pulses are 2+ on the right side and 0 on the left side.       Posterior tibial pulses are 0 on the left side.  Pulmonary:     Effort: Pulmonary effort is normal.     Breath sounds: Normal breath sounds.  Abdominal:     General: Abdomen is flat.     Palpations: Abdomen is soft.  Musculoskeletal: Normal range of motion.        General: No swelling.  Skin:    Capillary Refill: Capillary refill takes 2 to 3 seconds.     Comments: There is left great toe ulceration  Neurological:     General: No focal deficit present.     Mental Status: She is alert.  Psychiatric:        Mood and Affect: Mood normal.        Thought Content: Thought content normal.        Judgment: Judgment normal.     Data: I reviewed her aortoiliac duplex which demonstrates likely occlusion of her left common iliac artery stent.  ABI right 1.2 with toe pressure 103 left to 0.65 with toe pressure of 0.     Assessment/Plan:     53 year old female status post left common iliac artery stenting.  She is on Plavix as well as aspirin and a statin drug.  Unfortunately her stent has occluded between her last visit and now.  Her toe does appear to have good capillary refill and the ulcer is improving I think given the acuity of this issue she should have repeat angiography and we will set this up for next week with 1 of my partners.  She understands the risk and benefits.    Waynetta Sandy MD [facility]

## 2019-04-03 NOTE — H&P (View-Only) (Signed)
Patient ID: Karen Wu, female   DOB: October 09, 1965, 53 y.o.   MRN: [id number]  Reason for Consult: PAD   Referred by Angelina Sheriff, MD  Subjective:     HPI:  Karen Wu is a 53 y.o. female follows up after left common iliac artery stent.  She has been taking her Plavix walking 1.9 miles per day.  States that her left great is feeling much better.  She had noninvasive studies performed yesterday for today's visit.  Past Medical History:  Diagnosis Date  . Arthritis    hips, knees, lower back  . Asthma    rare inhaler use  . Brain tumor ([place])    x 2 - appear to be meningiomas, per neurosurgeon's report  . Dental crown present   . Diabetes ([place])   . Family history of adverse reaction to anesthesia    pt's father woke up during surgery; pt's mother has hx. of being hard to wake up post-op  . GERD (gastroesophageal reflux disease)    OTC as needed  . Graves' disease    no current med.  Marland Kitchen Heart murmur    as a child, states no problems  . History of anemia    no problems since endometrial ablation  . Hypertension    under control with meds., per pt.; has been on med. x 21 yr.  . Insulin dependent diabetes mellitus ([place])   . Trigger finger of right hand 10/2014   thumb and long finger   Family History  Problem Relation Age of Onset  . Anesthesia problems Mother        hard to wake up post-op  . Heart disease Mother   . Kidney failure Mother   . Stroke Mother   . Heart attack Mother   . Diabetes Mother   . Anesthesia problems Father        woke up during surgery  . Diabetes Father   . High blood pressure Father   . Kidney failure Maternal Grandmother   . Heart disease Maternal Grandfather   . Diabetes Maternal Grandfather   . Stroke Paternal Grandmother    Past Surgical History:  Procedure Laterality Date  . ABDOMINAL AORTOGRAM W/LOWER EXTREMITY Bilateral 03/16/2019   Procedure: ABDOMINAL AORTOGRAM W/LOWER EXTREMITY;  Surgeon: Waynetta Sandy, MD;   Location: [facility];  Service: Cardiovascular;  Laterality: Bilateral;  . BLADDER SUSPENSION  01/2014  . BREAST MASS EXCISION Left 05/30/2005  . ENDOMETRIAL ABLATION  01/2014  . FOOT SURGERY Left    exc. cyst and bone fragments  . MICROLARYNGOSCOPY WITH CO2 LASER AND EXCISION OF VOCAL CORD LESION  03/2014  . PERIPHERAL VASCULAR INTERVENTION Left 03/16/2019   Procedure: PERIPHERAL VASCULAR INTERVENTION;  Surgeon: Waynetta Sandy, MD;  Location: [facility];  Service: Cardiovascular;  Laterality: Left;  Iliac  . SHOULDER ARTHROSCOPY WITH ROTATOR CUFF REPAIR Right   . TONSILLECTOMY AND ADENOIDECTOMY    . TRIGGER FINGER RELEASE Right 10/28/2014   Procedure: RIGHT THUMB AND LONG FINGER RELEASE TRIGGER ;  Surgeon: Leanora Cover, MD;  Location: [facility];  Service: Orthopedics;  Laterality: Right;  . TUBAL LIGATION    . UMBILICAL HERNIA REPAIR      Short Social History:  Social History   Tobacco Use  . Smoking status: Former Smoker    Packs/day: 1.50    Years: 30.00    Pack years: 45.00    Types: Cigarettes    Quit  date: 02/2019    Years since quitting: 0.1  . Smokeless tobacco: Never Used  Substance Use Topics  . Alcohol use: No    Allergies  Allergen Reactions  . Cortisone Shortness Of Breath    Current Outpatient Medications  Medication Sig Dispense Refill  . Albuterol Sulfate (PROAIR RESPICLICK) 123XX123 (90 Base) MCG/ACT AEPB Inhale 1-2 puffs into the lungs 3 (three) times daily as needed (wheezing/shortness of breath).     Marland Kitchen amLODipine (NORVASC) 5 MG tablet Take 5 mg by mouth daily.    Marland Kitchen aspirin EC 81 MG tablet Take 81 mg by mouth every evening.    . cephALEXin (KEFLEX) 500 MG capsule Take 1 capsule (500 mg total) by mouth 3 (three) times daily. 42 capsule 0  . clopidogrel (PLAVIX) 75 MG tablet Take 1 tablet (75 mg total) by mouth daily. 30 tablet 11  . empagliflozin (JARDIANCE) 25 MG TABS tablet Take 25 mg by mouth daily.     . famotidine  (PEPCID) 40 MG tablet Take 40 mg by mouth at bedtime.     . folic acid (FOLVITE) 1 MG tablet Take 3 mg by mouth at bedtime.     . gabapentin (NEURONTIN) 300 MG capsule Take 300 mg by mouth 2 (two) times daily.     Marland Kitchen HUMIRA PEN 40 MG/0.4ML PNKT Inject 40 mg into the skin every 14 (fourteen) days. Fridays.    . hydrochlorothiazide (HYDRODIURIL) 25 MG tablet Take 25 mg by mouth daily.    . irbesartan (AVAPRO) 150 MG tablet Take 150 mg by mouth daily.    Marland Kitchen LEVEMIR FLEXTOUCH 100 UNIT/ML Pen Inject 5-45 Units into the skin See admin instructions. Inject 5 units in the morning & 45 units at bedtime    . metFORMIN (GLUCOPHAGE) 1000 MG tablet Take 1,000 mg by mouth 2 (two) times daily with a meal.    . methotrexate 50 MG/2ML injection Inject 25 mg into the skin every Sunday.    Marland Kitchen oxybutynin (DITROPAN-XL) 10 MG 24 hr tablet Take 10 mg by mouth at bedtime.     Marland Kitchen PENNSAID 2 % SOLN Apply 1 application topically 2 (two) times daily as needed (hip pain.).    Marland Kitchen rosuvastatin (CRESTOR) 20 MG tablet Take 20 mg by mouth at bedtime.     . traMADol (ULTRAM) 50 MG tablet Take 50 mg by mouth 3 (three) times daily as needed.    . Venlafaxine HCl 225 MG TB24 Take 225 mg by mouth at bedtime.    Marland Kitchen HYDROcodone-acetaminophen (NORCO/VICODIN) 5-325 MG tablet Take 1 tablet by mouth every 4 (four) hours as needed. (Patient not taking: Reported on 04/03/2019) 10 tablet 0   No current facility-administered medications for this visit.     Review of Systems  Constitutional:  Constitutional negative. HENT: HENT negative.  Eyes: Eyes negative.  Respiratory: Respiratory negative.  Cardiovascular: Cardiovascular negative.  GI: Gastrointestinal negative.  Musculoskeletal: Musculoskeletal negative.  Skin: Positive for wound.  Neurological: Neurological negative. Hematologic: Hematologic/lymphatic negative.  Psychiatric: Psychiatric negative.        Objective:  Objective   Vitals:   04/03/19 0822  BP: 125/76  Pulse: 76   Resp: 16  Temp: 97.6 F (36.4 C)  TempSrc: Temporal  SpO2: 99%  Weight: 173 lb (78.5 kg)  Height: 5\' 5"  (1.651 m)   Body mass index is 28.79 kg/m.  Physical Exam HENT:     Head: Normocephalic.     Nose: Nose normal.  Eyes:     Pupils: Pupils  are equal, round, and reactive to light.  Cardiovascular:     Pulses:          Femoral pulses are 2+ on the right side and 0 on the left side.      Popliteal pulses are 2+ on the right side and 0 on the left side.       Dorsalis pedis pulses are 2+ on the right side and 0 on the left side.       Posterior tibial pulses are 0 on the left side.  Pulmonary:     Effort: Pulmonary effort is normal.     Breath sounds: Normal breath sounds.  Abdominal:     General: Abdomen is flat.     Palpations: Abdomen is soft.  Musculoskeletal: Normal range of motion.        General: No swelling.  Skin:    Capillary Refill: Capillary refill takes 2 to 3 seconds.     Comments: There is left great toe ulceration  Neurological:     General: No focal deficit present.     Mental Status: She is alert.  Psychiatric:        Mood and Affect: Mood normal.        Thought Content: Thought content normal.        Judgment: Judgment normal.     Data: I reviewed her aortoiliac duplex which demonstrates likely occlusion of her left common iliac artery stent.  ABI right 1.2 with toe pressure 103 left to 0.65 with toe pressure of 0.     Assessment/Plan:     53 year old female status post left common iliac artery stenting.  She is on Plavix as well as aspirin and a statin drug.  Unfortunately her stent has occluded between her last visit and now.  Her toe does appear to have good capillary refill and the ulcer is improving I think given the acuity of this issue she should have repeat angiography and we will set this up for next week with 1 of my partners.  She understands the risk and benefits.    Waynetta Sandy MD [facility]

## 2019-04-05 DIAGNOSIS — T82868A Thrombosis of vascular prosthetic devices, implants and grafts, initial encounter: Secondary | ICD-10-CM | POA: Diagnosis not present

## 2019-04-06 ENCOUNTER — Encounter: Payer: Self-pay | Admitting: Allergy and Immunology

## 2019-04-07 ENCOUNTER — Other Ambulatory Visit: Payer: Self-pay

## 2019-04-07 ENCOUNTER — Ambulatory Visit (HOSPITAL_COMMUNITY)
Admission: RE | Admit: 2019-04-07 | Discharge: 2019-04-07 | Disposition: A | Discharge status: Home / Self Care | Payer: Federal, State, Local not specified - PPO | Attending: Surgery | Admitting: Surgery

## 2019-04-07 ENCOUNTER — Encounter (HOSPITAL_COMMUNITY)
Admission: RE | Disposition: A | Discharge status: Home / Self Care | Payer: Self-pay | Source: Home / Self Care | Attending: Surgery

## 2019-04-07 DIAGNOSIS — M1990 Unspecified osteoarthritis, unspecified site: Secondary | ICD-10-CM | POA: Diagnosis not present

## 2019-04-07 DIAGNOSIS — I70245 Atherosclerosis of native arteries of left leg with ulceration of other part of foot: Secondary | ICD-10-CM | POA: Diagnosis not present

## 2019-04-07 DIAGNOSIS — Z794 Long term (current) use of insulin: Secondary | ICD-10-CM | POA: Insufficient documentation

## 2019-04-07 DIAGNOSIS — Z79899 Other long term (current) drug therapy: Secondary | ICD-10-CM | POA: Diagnosis not present

## 2019-04-07 DIAGNOSIS — L97529 Non-pressure chronic ulcer of other part of left foot with unspecified severity: Secondary | ICD-10-CM | POA: Diagnosis not present

## 2019-04-07 DIAGNOSIS — J45909 Unspecified asthma, uncomplicated: Secondary | ICD-10-CM | POA: Insufficient documentation

## 2019-04-07 DIAGNOSIS — E1151 Type 2 diabetes mellitus with diabetic peripheral angiopathy without gangrene: Secondary | ICD-10-CM | POA: Insufficient documentation

## 2019-04-07 DIAGNOSIS — K219 Gastro-esophageal reflux disease without esophagitis: Secondary | ICD-10-CM | POA: Diagnosis not present

## 2019-04-07 DIAGNOSIS — I10 Essential (primary) hypertension: Secondary | ICD-10-CM | POA: Diagnosis not present

## 2019-04-07 DIAGNOSIS — Z7982 Long term (current) use of aspirin: Secondary | ICD-10-CM | POA: Insufficient documentation

## 2019-04-07 DIAGNOSIS — Z7902 Long term (current) use of antithrombotics/antiplatelets: Secondary | ICD-10-CM | POA: Diagnosis not present

## 2019-04-07 DIAGNOSIS — E11621 Type 2 diabetes mellitus with foot ulcer: Secondary | ICD-10-CM | POA: Diagnosis not present

## 2019-04-07 DIAGNOSIS — F17210 Nicotine dependence, cigarettes, uncomplicated: Secondary | ICD-10-CM | POA: Diagnosis not present

## 2019-04-07 HISTORY — PX: PERIPHERAL VASCULAR INTERVENTION: CATH118257

## 2019-04-07 HISTORY — PX: LOWER EXTREMITY ANGIOGRAPHY: CATH118251

## 2019-04-07 LAB — POCT I-STAT, CHEM 8
BUN: 14 mg/dL (ref 6–20)
Calcium, Ion: 1.16 mmol/L (ref 1.15–1.40)
Chloride: 102 mmol/L (ref 98–111)
Creatinine, Ser: 0.5 mg/dL (ref 0.44–1.00)
Glucose, Bld: 168 mg/dL — ABNORMAL HIGH (ref 70–99)
HCT: 43 % (ref 36.0–46.0)
Hemoglobin: 14.6 g/dL (ref 12.0–15.0)
Potassium: 4.1 mmol/L (ref 3.5–5.1)
Sodium: 140 mmol/L (ref 135–145)
TCO2: 28 mmol/L (ref 22–32)

## 2019-04-07 LAB — POCT ACTIVATED CLOTTING TIME: Activated Clotting Time: 252 seconds

## 2019-04-07 SURGERY — LOWER EXTREMITY ANGIOGRAPHY
Anesthesia: LOCAL | Laterality: Left

## 2019-04-07 MED ORDER — ONDANSETRON HCL 4 MG/2ML IJ SOLN: 4.0000 mg | Freq: Four times a day (QID) | INTRAMUSCULAR | Status: DC | PRN

## 2019-04-07 MED ORDER — HEPARIN (PORCINE) IN NACL 1000-0.9 UT/500ML-% IV SOLN
INTRAVENOUS | Status: DC | PRN
  Administered 2019-04-07 (×2): 500 mL

## 2019-04-07 MED ORDER — SODIUM CHLORIDE 0.9 % IV SOLN: 250.0000 mL | INTRAVENOUS | Status: DC | PRN

## 2019-04-07 MED ORDER — MIDAZOLAM HCL 2 MG/2ML IJ SOLN
INTRAMUSCULAR | Status: AC
  Filled 2019-04-07: qty 2

## 2019-04-07 MED ORDER — FENTANYL CITRATE (PF) 100 MCG/2ML IJ SOLN
INTRAMUSCULAR | Status: DC | PRN
  Administered 2019-04-07 (×3): 25 ug via INTRAVENOUS
  Administered 2019-04-07: 50 ug via INTRAVENOUS

## 2019-04-07 MED ORDER — ACETAMINOPHEN 325 MG PO TABS: 650.0000 mg | ORAL_TABLET | ORAL | Status: DC | PRN

## 2019-04-07 MED ORDER — LIDOCAINE HCL (PF) 1 % IJ SOLN
INTRAMUSCULAR | Status: AC
  Filled 2019-04-07: qty 30

## 2019-04-07 MED ORDER — FENTANYL CITRATE (PF) 100 MCG/2ML IJ SOLN
INTRAMUSCULAR | Status: AC
  Filled 2019-04-07: qty 2

## 2019-04-07 MED ORDER — IODIXANOL 320 MG/ML IV SOLN
INTRAVENOUS | Status: DC | PRN
  Administered 2019-04-07: 11:00:00 180 mL via INTRA_ARTERIAL

## 2019-04-07 MED ORDER — LABETALOL HCL 5 MG/ML IV SOLN: 10.0000 mg | INTRAVENOUS | Status: DC | PRN

## 2019-04-07 MED ORDER — HEPARIN SODIUM (PORCINE) 1000 UNIT/ML IJ SOLN
INTRAMUSCULAR | Status: AC
  Filled 2019-04-07: qty 1

## 2019-04-07 MED ORDER — SODIUM CHLORIDE 0.9 % WEIGHT BASED INFUSION: 1.0000 mL/kg/h | INTRAVENOUS | Status: DC

## 2019-04-07 MED ORDER — SODIUM CHLORIDE 0.9% FLUSH: 3.0000 mL | INTRAVENOUS | Status: DC | PRN

## 2019-04-07 MED ORDER — LIDOCAINE HCL (PF) 1 % IJ SOLN
INTRAMUSCULAR | Status: DC | PRN
  Administered 2019-04-07 (×2): 15 mL via INTRADERMAL

## 2019-04-07 MED ORDER — HEPARIN SODIUM (PORCINE) 1000 UNIT/ML IJ SOLN
INTRAMUSCULAR | Status: DC | PRN
  Administered 2019-04-07: 8000 [IU] via INTRAVENOUS
  Administered 2019-04-07: 1000 [IU] via INTRAVENOUS

## 2019-04-07 MED ORDER — ASPIRIN EC 81 MG PO TBEC: 81.0000 mg | DELAYED_RELEASE_TABLET | Freq: Every day | ORAL | Status: DC

## 2019-04-07 MED ORDER — HEPARIN (PORCINE) IN NACL 1000-0.9 UT/500ML-% IV SOLN
INTRAVENOUS | Status: AC
  Filled 2019-04-07: qty 500

## 2019-04-07 MED ORDER — HYDRALAZINE HCL 20 MG/ML IJ SOLN: 5.0000 mg | INTRAMUSCULAR | Status: DC | PRN

## 2019-04-07 MED ORDER — MIDAZOLAM HCL 2 MG/2ML IJ SOLN
INTRAMUSCULAR | Status: DC | PRN
  Administered 2019-04-07 (×4): 1 mg via INTRAVENOUS

## 2019-04-07 MED ORDER — SODIUM CHLORIDE 0.9% FLUSH: 3.0000 mL | Freq: Two times a day (BID) | INTRAVENOUS | Status: DC

## 2019-04-07 MED ORDER — SODIUM CHLORIDE 0.9 % IV SOLN: INTRAVENOUS | Status: DC

## 2019-04-07 MED ORDER — TICAGRELOR 90 MG PO TABS: 90.0000 mg | ORAL_TABLET | Freq: Two times a day (BID) | ORAL | 3 refills | Status: DC

## 2019-04-07 SURGICAL SUPPLY — 22 items
CATH INDIGO CAT6 KIT (CATHETERS) ×2 IMPLANT
CATH INDIGO SEP 8 (CATHETERS) ×1 IMPLANT
CATH OMNI FLUSH 5F 65CM (CATHETERS) ×2 IMPLANT
CATH STRAIGHT 5FR 65CM (CATHETERS) ×1 IMPLANT
CLOSURE MYNX CONTROL 5F (Vascular Products) ×1 IMPLANT
CLOSURE MYNX CONTROL 6F/7F (Vascular Products) ×1 IMPLANT
DEVICE TORQUE H2O (MISCELLANEOUS) ×1 IMPLANT
GUIDEWIRE ANGLED .035X150CM (WIRE) ×1 IMPLANT
KIT ENCORE 26 ADVANTAGE (KITS) ×1 IMPLANT
KIT MICROPUNCTURE NIT STIFF (SHEATH) ×1 IMPLANT
KIT PV (KITS) ×3 IMPLANT
SHEATH BRITE TIP 7FR 35CM (SHEATH) ×1 IMPLANT
SHEATH PINNACLE 5F 10CM (SHEATH) ×1 IMPLANT
SHEATH PINNACLE 7F 10CM (SHEATH) ×2 IMPLANT
SHEATH PROBE COVER 6X72 (BAG) ×1 IMPLANT
STENT VIABAHNBX 8X59X80 (Permanent Stent) ×2 IMPLANT
SYR 10CC CONTROL (SYRINGE) ×1 IMPLANT
SYR MEDRAD MARK V 150ML (SYRINGE) ×1 IMPLANT
TRANSDUCER W/STOPCOCK (MISCELLANEOUS) ×3 IMPLANT
TRAY PV CATH (CUSTOM PROCEDURE TRAY) ×3 IMPLANT
WIRE BENTSON .035X145CM (WIRE) ×4 IMPLANT
WIRE HI TORQ VERSACORE J 260CM (WIRE) ×2 IMPLANT

## 2019-04-07 NOTE — Interval H&P Note (Signed)
History and Physical Interval Note:  04/07/2019 9:25 AM  Karen Wu  has presented today for surgery, with the diagnosis of Peripheral Vascular Disease.  The various methods of treatment have been discussed with the patient and family. After consideration of risks, benefits and other options for treatment, the patient has consented to  Procedure(s): LOWER EXTREMITY ANGIOGRAPHY (Bilateral) as a surgical intervention.  The patient's history has been reviewed, patient examined, no change in status, stable for surgery.  I have reviewed the patient's chart and labs.  Questions were answered to the patient's satisfaction.     Annamarie Major

## 2019-04-07 NOTE — Op Note (Signed)
Patient name: Karen Wu MRN: [id number] DOB: June 17, 1966 Sex: female  04/07/2019 Pre-operative Diagnosis: Occluded left iliac stent Post-operative diagnosis:  Same Surgeon:  Annamarie Major Procedure Performed:  1.  Ultrasound-guided access, left femoral artery  2.  Ultrasound-guided access, right femoral artery  3.  Abdominal aortogram  4.  Bilateral lower extremity runoff  5.  Mechanical thrombectomy, left common iliac artery  6.  Stent, left common iliac artery (VBX 8 x 57 x 2)  7.  Conscious sedation 76 minutes  8.  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  She has developed a silent occlusion of the stent but has persistent wound on her left great toe and is here for further evaluation  Procedure:  The patient was identified in the holding area and taken to room 8.  The patient was then placed supine on the table and prepped and draped in the usual sterile fashion.  A time out was called.  Conscious sedation was administered with the use of IV fentanyl and Versed under continuous physician and nurse monitoring.  Heart rate, blood pressure, and oxygen saturation were continuously monitored.  Total sedation time was 76 minutes.  Ultrasound was used to evaluate the right common femoral artery.  It was patent .  A digital ultrasound image was acquired.  A micropuncture needle was used to access the right common femoral artery under ultrasound guidance.  An 018 wire was advanced without resistance and a micropuncture sheath was placed.  The 018 wire was removed and a benson wire was placed.  The micropuncture sheath was exchanged for a 5 french sheath.  An omniflush catheter was advanced over the wire to the level of L-1.  An abdominal angiogram was obtained.  Next, the cath was pulled out of aorta bifurcation bilateral runoff was performed Findings:   Aortogram: No significant renal artery  stenosis.  The infrarenal abdominal aorta is widely patent.  The right common, external, and internal iliac arteries are widely patent.  The left common iliac artery is occluded.  The internal and external iliac artery reconstitute and are widely patent.  Right Lower Extremity: Right common femoral profundofemoral and superficial femoral artery widely patent.  Popliteal arteries widely patent.  There is three-vessel runoff.  Left Lower Extremity: Left common femoral profundofemoral and superficial femoral artery widely patent.  Popliteal arteries widely patent.  There is three-vessel runoff to the ankle.  Posterior tibial artery is a dominant vessel across the ankle.  The pedal arch is not intact.  Intervention: After the above images were obtained the decision was made to proceed with intervention.  Using ultrasound, the left common femoral artery was cannulated under ultrasound guidance with a micropuncture needle.  An 018 wire was advanced without resistance and a micropuncture sheath was placed.  An 035 wire was then inserted and a 7 French bright tip sheath was placed.  The patient was fully heparinized.  ACT measurements were obtained and re-dosing was done appropriately.  I then used a Glidewire and catheter to cross the iliac stent occlusion.  This appeared to be soft thrombus as the wire went relatively easily.  I therefore set up for mechanical thrombectomy.  I used a penumbra CAT 6 device and perform mechanical thrombectomy within the stent making multiple passes as well as using the separator.  After mechanical thrombectomy, follow-up imaging revealed continued occlusion of the common iliac artery stents.  I  felt that these needed to be restented.  I wanted to use a covered stent.  We did not have the appropriate sized Viabond, and so I felt that a VBX was again required.  I placed 2 overlapping 8 x 59 VBX stents.  I made sure that I extended proximally and distally to the previously placed stents.   Follow-up imaging showed resolution of the occlusion and widely patent iliac artery system with no change in the runoff.  Both sheaths were removed and Mynx closure devices were deployed without complication  Impression:  #1  Occluded left common iliac stent.  Mechanical thrombectomy was performed and repeat stenting using VBX 8 x 59 x 2 overlapping stents.  #2  Unknown explanation for occlusion of her left common iliac stent  #3  Patient will be changed from Plavix to Brilinta for antiplatelet therapy    V. Annamarie Major, M.D., [facility] [place] Office: [phone] Pager:  [phone]

## 2019-04-07 NOTE — Progress Notes (Signed)
Discharge instructions reviewed with patient and husband. Verbalized understanding

## 2019-04-07 NOTE — Discharge Instructions (Signed)

## 2019-04-07 NOTE — Progress Notes (Signed)
Upon assessment, patient noted to have saturated gauze on left groin. Pressure began to be held and MD Brabham called. Pt denies pain, no hematoma felt. MD advised to hold light pressure for 20 minutes. Will continue to assess.

## 2019-04-08 ENCOUNTER — Encounter (HOSPITAL_COMMUNITY): Payer: Self-pay | Admitting: Surgery

## 2019-04-08 ENCOUNTER — Encounter: Payer: Federal, State, Local not specified - PPO | Admitting: Allergy and Immunology

## 2019-04-13 ENCOUNTER — Encounter: Payer: Self-pay | Admitting: Allergy and Immunology

## 2019-04-17 ENCOUNTER — Encounter (HOSPITAL_COMMUNITY): Payer: Federal, State, Local not specified - PPO

## 2019-04-17 ENCOUNTER — Ambulatory Visit (HOSPITAL_COMMUNITY): Payer: Federal, State, Local not specified - PPO

## 2019-04-17 ENCOUNTER — Encounter: Payer: Federal, State, Local not specified - PPO | Admitting: Family

## 2019-04-24 ENCOUNTER — Encounter (HOSPITAL_COMMUNITY): Payer: Federal, State, Local not specified - PPO

## 2019-04-24 ENCOUNTER — Encounter: Payer: Federal, State, Local not specified - PPO | Admitting: Family

## 2019-04-27 ENCOUNTER — Ambulatory Visit (INDEPENDENT_AMBULATORY_CARE_PROVIDER_SITE_OTHER): Payer: Federal, State, Local not specified - PPO | Admitting: Allergy and Immunology

## 2019-04-27 ENCOUNTER — Encounter: Payer: Self-pay | Admitting: Allergy and Immunology

## 2019-04-27 ENCOUNTER — Other Ambulatory Visit: Payer: Self-pay

## 2019-04-27 VITALS — BP 130/82 | HR 76 | Temp 98.4°F | Resp 16

## 2019-04-27 DIAGNOSIS — T50905D Adverse effect of unspecified drugs, medicaments and biological substances, subsequent encounter: Secondary | ICD-10-CM

## 2019-04-27 NOTE — Progress Notes (Signed)
Karen Wu presents to this clinic to have a triamcinolone challenge performed.  Using an incremental challenge protocol she had epi cutaneous and intramuscular injections of triamcinolone over the course of 2 hours.  At no point in time did she ever demonstrate any hypersensitivity against this triamcinolone exposure which ultimately included the administration of 40 mg intramuscular triamcinolone administration.

## 2019-04-28 ENCOUNTER — Encounter: Payer: Self-pay | Admitting: Allergy and Immunology

## 2019-05-06 DIAGNOSIS — F17210 Nicotine dependence, cigarettes, uncomplicated: Secondary | ICD-10-CM | POA: Diagnosis not present

## 2019-05-06 DIAGNOSIS — R531 Weakness: Secondary | ICD-10-CM | POA: Diagnosis not present

## 2019-05-06 DIAGNOSIS — E119 Type 2 diabetes mellitus without complications: Secondary | ICD-10-CM | POA: Diagnosis not present

## 2019-05-06 DIAGNOSIS — G459 Transient cerebral ischemic attack, unspecified: Secondary | ICD-10-CM | POA: Insufficient documentation

## 2019-05-06 DIAGNOSIS — R202 Paresthesia of skin: Secondary | ICD-10-CM | POA: Diagnosis not present

## 2019-05-06 DIAGNOSIS — Z794 Long term (current) use of insulin: Secondary | ICD-10-CM | POA: Diagnosis not present

## 2019-05-06 DIAGNOSIS — K219 Gastro-esophageal reflux disease without esophagitis: Secondary | ICD-10-CM | POA: Diagnosis not present

## 2019-05-06 DIAGNOSIS — Z7901 Long term (current) use of anticoagulants: Secondary | ICD-10-CM | POA: Diagnosis not present

## 2019-05-06 DIAGNOSIS — D329 Benign neoplasm of meninges, unspecified: Secondary | ICD-10-CM | POA: Diagnosis not present

## 2019-05-06 DIAGNOSIS — Z86011 Personal history of benign neoplasm of the brain: Secondary | ICD-10-CM | POA: Diagnosis not present

## 2019-05-06 DIAGNOSIS — M069 Rheumatoid arthritis, unspecified: Secondary | ICD-10-CM | POA: Diagnosis not present

## 2019-05-06 DIAGNOSIS — E1142 Type 2 diabetes mellitus with diabetic polyneuropathy: Secondary | ICD-10-CM | POA: Diagnosis not present

## 2019-05-06 DIAGNOSIS — I10 Essential (primary) hypertension: Secondary | ICD-10-CM | POA: Diagnosis not present

## 2019-05-06 DIAGNOSIS — F419 Anxiety disorder, unspecified: Secondary | ICD-10-CM | POA: Diagnosis not present

## 2019-05-06 DIAGNOSIS — R000 Tachycardia, unspecified: Secondary | ICD-10-CM | POA: Diagnosis not present

## 2019-05-06 DIAGNOSIS — E1151 Type 2 diabetes mellitus with diabetic peripheral angiopathy without gangrene: Secondary | ICD-10-CM | POA: Diagnosis not present

## 2019-05-06 DIAGNOSIS — Z7902 Long term (current) use of antithrombotics/antiplatelets: Secondary | ICD-10-CM | POA: Diagnosis not present

## 2019-05-06 DIAGNOSIS — Z79899 Other long term (current) drug therapy: Secondary | ICD-10-CM | POA: Diagnosis not present

## 2019-05-06 DIAGNOSIS — R42 Dizziness and giddiness: Secondary | ICD-10-CM | POA: Diagnosis not present

## 2019-05-06 DIAGNOSIS — I4510 Unspecified right bundle-branch block: Secondary | ICD-10-CM | POA: Diagnosis not present

## 2019-05-06 DIAGNOSIS — R200 Anesthesia of skin: Secondary | ICD-10-CM | POA: Diagnosis not present

## 2019-05-06 DIAGNOSIS — R81 Glycosuria: Secondary | ICD-10-CM | POA: Diagnosis not present

## 2019-05-06 DIAGNOSIS — I739 Peripheral vascular disease, unspecified: Secondary | ICD-10-CM | POA: Diagnosis not present

## 2019-05-06 DIAGNOSIS — I6502 Occlusion and stenosis of left vertebral artery: Secondary | ICD-10-CM | POA: Diagnosis not present

## 2019-05-06 DIAGNOSIS — E1140 Type 2 diabetes mellitus with diabetic neuropathy, unspecified: Secondary | ICD-10-CM | POA: Diagnosis not present

## 2019-05-07 DIAGNOSIS — I10 Essential (primary) hypertension: Secondary | ICD-10-CM | POA: Diagnosis not present

## 2019-05-07 DIAGNOSIS — E119 Type 2 diabetes mellitus without complications: Secondary | ICD-10-CM | POA: Diagnosis not present

## 2019-05-07 DIAGNOSIS — G459 Transient cerebral ischemic attack, unspecified: Secondary | ICD-10-CM | POA: Diagnosis not present

## 2019-05-07 DIAGNOSIS — R569 Unspecified convulsions: Secondary | ICD-10-CM | POA: Diagnosis not present

## 2019-05-07 DIAGNOSIS — I739 Peripheral vascular disease, unspecified: Secondary | ICD-10-CM | POA: Diagnosis not present

## 2019-05-07 DIAGNOSIS — F419 Anxiety disorder, unspecified: Secondary | ICD-10-CM | POA: Diagnosis not present

## 2019-05-11 ENCOUNTER — Other Ambulatory Visit: Payer: Self-pay

## 2019-05-11 DIAGNOSIS — I739 Peripheral vascular disease, unspecified: Secondary | ICD-10-CM

## 2019-05-12 DIAGNOSIS — Z8673 Personal history of transient ischemic attack (TIA), and cerebral infarction without residual deficits: Secondary | ICD-10-CM | POA: Diagnosis not present

## 2019-05-12 DIAGNOSIS — Z6829 Body mass index (BMI) 29.0-29.9, adult: Secondary | ICD-10-CM | POA: Diagnosis not present

## 2019-05-14 DIAGNOSIS — Z20828 Contact with and (suspected) exposure to other viral communicable diseases: Secondary | ICD-10-CM | POA: Diagnosis not present

## 2019-05-15 ENCOUNTER — Encounter (HOSPITAL_COMMUNITY): Payer: Federal, State, Local not specified - PPO

## 2019-05-15 ENCOUNTER — Ambulatory Visit (HOSPITAL_COMMUNITY): Payer: Federal, State, Local not specified - PPO

## 2019-05-15 ENCOUNTER — Encounter: Payer: Federal, State, Local not specified - PPO | Admitting: Family

## 2019-05-28 DIAGNOSIS — Z6828 Body mass index (BMI) 28.0-28.9, adult: Secondary | ICD-10-CM | POA: Diagnosis not present

## 2019-05-28 DIAGNOSIS — F410 Panic disorder [episodic paroxysmal anxiety] without agoraphobia: Secondary | ICD-10-CM | POA: Diagnosis not present

## 2019-05-28 DIAGNOSIS — R202 Paresthesia of skin: Secondary | ICD-10-CM | POA: Diagnosis not present

## 2019-05-28 DIAGNOSIS — R232 Flushing: Secondary | ICD-10-CM | POA: Diagnosis not present

## 2019-05-29 ENCOUNTER — Encounter: Payer: Self-pay | Admitting: Family

## 2019-05-29 ENCOUNTER — Ambulatory Visit (INDEPENDENT_AMBULATORY_CARE_PROVIDER_SITE_OTHER): Payer: Self-pay | Admitting: Family

## 2019-05-29 ENCOUNTER — Other Ambulatory Visit: Payer: Self-pay

## 2019-05-29 ENCOUNTER — Ambulatory Visit (HOSPITAL_COMMUNITY)
Admission: RE | Admit: 2019-05-29 | Discharge: 2019-05-29 | Disposition: A | Discharge status: Home / Self Care | Payer: Federal, State, Local not specified - PPO | Source: Ambulatory Visit | Attending: Family | Admitting: Family

## 2019-05-29 ENCOUNTER — Ambulatory Visit (INDEPENDENT_AMBULATORY_CARE_PROVIDER_SITE_OTHER)
Admission: RE | Admit: 2019-05-29 | Discharge: 2019-05-29 | Disposition: A | Discharge status: Home / Self Care | Payer: Federal, State, Local not specified - PPO | Source: Ambulatory Visit | Attending: Family | Admitting: Family

## 2019-05-29 VITALS — BP 134/84 | HR 78 | Temp 99.0°F | Resp 16

## 2019-05-29 DIAGNOSIS — E1165 Type 2 diabetes mellitus with hyperglycemia: Secondary | ICD-10-CM

## 2019-05-29 DIAGNOSIS — I739 Peripheral vascular disease, unspecified: Secondary | ICD-10-CM

## 2019-05-29 DIAGNOSIS — E1151 Type 2 diabetes mellitus with diabetic peripheral angiopathy without gangrene: Secondary | ICD-10-CM

## 2019-05-29 DIAGNOSIS — IMO0002 Reserved for concepts with insufficient information to code with codable children: Secondary | ICD-10-CM

## 2019-05-29 DIAGNOSIS — Z8673 Personal history of transient ischemic attack (TIA), and cerebral infarction without residual deficits: Secondary | ICD-10-CM

## 2019-05-29 DIAGNOSIS — I745 Embolism and thrombosis of iliac artery: Secondary | ICD-10-CM

## 2019-05-29 DIAGNOSIS — Z95828 Presence of other vascular implants and grafts: Secondary | ICD-10-CM

## 2019-05-29 DIAGNOSIS — F17200 Nicotine dependence, unspecified, uncomplicated: Secondary | ICD-10-CM

## 2019-05-29 NOTE — Progress Notes (Addendum)
[facility] [place]   CC: Follow up left iliac artery stent insertion and thrombectomy, peripheral artery occlusive disease  History of Present Illness Karen Wu is a 53 y.o. female who is s/p               1.  Ultrasound-guided access, left femoral artery             2.  Ultrasound-guided access, right femoral artery             3.  Abdominal aortogram             4.  Bilateral lower extremity runoff             5.  Mechanical thrombectomy, left common iliac artery             6.  Stent, left common iliac artery (VBX 8 x 57 x 2)             7.  Conscious sedation 76 minutes             8.  Closure device x2 (Mynx) On 04-07-19 by Dr. Trula Slade for an occluded left iliac stent. 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 Donzetta Matters.  She then developed a silent occlusion of the stent but has persistent wound on her left great toe and was for further evaluation.  Findings:              Aortogram: No significant renal artery stenosis.  The infrarenal abdominal aorta is widely patent.  The right common, external, and internal iliac arteries are widely patent.  The left common iliac artery is occluded.  The internal and external iliac artery reconstitute and are widely patent.             Right Lower Extremity: Right common femoral profundofemoral and superficial femoral artery widely patent.  Popliteal arteries widely patent.  There is three-vessel runoff.             Left Lower Extremity: Left common femoral profundofemoral and superficial femoral artery widely patent.  Popliteal arteries widely patent.  There is three-vessel runoff to the ankle.  Posterior tibial artery is a dominant vessel across the ankle.  The pedal arch is not intact.  Pt states her left great toe had a non healing wound and was painful before the 04-07-19 left leg intervention, but is healed now and no longer painful.   She states that  she has severe panic attacks. She states that she has dizzy spells. She panics when left by herself.  About 5 weeks ago her left arm and leg had tingling, resolved within a few minutes, she is seeing a neurologist.  She states that she has nodules on her brain, had been evaluated at [facility] for this.  She was hospitalized in [facility] after the TIA symptoms.   She states that she has OA and RA.   Diabetic: Yes, A1C was 8.0 on 03-16-19 Tobacco use: smoker  (resumed smoking about September 2020 after 5 weeks quit, 2/3 ppd, was 2 ppd, started at age 47 yrs, quit a few times)  Pt meds include: Statin :Yes Betablocker: No ASA: Yes Other anticoagulants/antiplatelets: Brilinta  Past Medical History:  Diagnosis Date  . Arthritis    hips, knees, lower back  . Asthma    rare inhaler use  . Brain tumor ([place])    x 2 -  appear to be meningiomas, per neurosurgeon's report  . Dental crown present   . Diabetes ([place])   . Family history of adverse reaction to anesthesia    pt's father woke up during surgery; pt's mother has hx. of being hard to wake up post-op  . GERD (gastroesophageal reflux disease)    OTC as needed  . Graves' disease    no current med.  Marland Kitchen Heart murmur    as a child, states no problems  . History of anemia    no problems since endometrial ablation  . Hypertension    under control with meds., per pt.; has been on med. x 21 yr.  . Insulin dependent diabetes mellitus   . Trigger finger of right hand 10/2014   thumb and long finger    Social History Social History   Tobacco Use  . Smoking status: Former Smoker    Packs/day: 1.50    Years: 30.00    Pack years: 45.00    Types: Cigarettes    Quit date: 02/2019    Years since quitting: 0.2  . Smokeless tobacco: Never Used  Substance Use Topics  . Alcohol use: No  . Drug use: No    Family History Family History  Problem Relation Age of Onset  . Anesthesia problems Mother        hard to wake up post-op  .  Heart disease Mother   . Kidney failure Mother   . Stroke Mother   . Heart attack Mother   . Diabetes Mother   . Anesthesia problems Father        woke up during surgery  . Diabetes Father   . High blood pressure Father   . Kidney failure Maternal Grandmother   . Heart disease Maternal Grandfather   . Diabetes Maternal Grandfather   . Stroke Paternal Grandmother     Past Surgical History:  Procedure Laterality Date  . ABDOMINAL AORTOGRAM W/LOWER EXTREMITY Bilateral 03/16/2019   Procedure: ABDOMINAL AORTOGRAM W/LOWER EXTREMITY;  Surgeon: Waynetta Sandy, MD;  Location: [place] [facility];  Service: Cardiovascular;  Laterality: Bilateral;  . BLADDER SUSPENSION  01/2014  . BREAST MASS EXCISION Left 05/30/2005  . ENDOMETRIAL ABLATION  01/2014  . FOOT SURGERY Left    exc. cyst and bone fragments  . LOWER EXTREMITY ANGIOGRAPHY Bilateral 04/07/2019   Procedure: LOWER EXTREMITY ANGIOGRAPHY;  Surgeon: Serafina Mitchell, MD;  Location: [place] [facility];  Service: Cardiovascular;  Laterality: Bilateral;  . MICROLARYNGOSCOPY WITH CO2 LASER AND EXCISION OF VOCAL CORD LESION  03/2014  . PERIPHERAL VASCULAR INTERVENTION Left 03/16/2019   Procedure: PERIPHERAL VASCULAR INTERVENTION;  Surgeon: Waynetta Sandy, MD;  Location: [place] [facility];  Service: Cardiovascular;  Laterality: Left;  Iliac  . PERIPHERAL VASCULAR INTERVENTION Left 04/07/2019   Procedure: PERIPHERAL VASCULAR INTERVENTION;  Surgeon: Serafina Mitchell, MD;  Location: [place] [facility];  Service: Cardiovascular;  Laterality: Left;  Common Iliac and Thrombectomy  . SHOULDER ARTHROSCOPY WITH ROTATOR CUFF REPAIR Right   . TONSILLECTOMY AND ADENOIDECTOMY    . TRIGGER FINGER RELEASE Right 10/28/2014   Procedure: RIGHT THUMB AND LONG FINGER RELEASE TRIGGER ;  Surgeon: Leanora Cover, MD;  Location: [place];  Service: Orthopedics;  Laterality: Right;  . TUBAL LIGATION    . UMBILICAL HERNIA REPAIR       Allergies  Allergen Reactions  . Cortisone Anaphylaxis and Shortness Of Breath    Current Outpatient Medications  Medication Sig Dispense Refill  .  Albuterol Sulfate (PROAIR RESPICLICK) 123XX123 (90 Base) MCG/ACT AEPB Inhale 1-2 puffs into the lungs 3 (three) times daily as needed (wheezing/shortness of breath).     Marland Kitchen amLODipine (NORVASC) 5 MG tablet Take 5 mg by mouth daily.    Marland Kitchen aspirin EC 81 MG tablet Take 81 mg by mouth daily.     . empagliflozin (JARDIANCE) 25 MG TABS tablet Take 25 mg by mouth daily.     . famotidine (PEPCID) 40 MG tablet Take 40 mg by mouth at bedtime.     . folic acid (FOLVITE) 1 MG tablet Take 3 mg by mouth at bedtime.     . gabapentin (NEURONTIN) 300 MG capsule Take 300 mg by mouth 2 (two) times daily.     Marland Kitchen HUMIRA PEN 40 MG/0.4ML PNKT Inject 40 mg into the skin every 14 (fourteen) days. Fridays.    . hydrochlorothiazide (HYDRODIURIL) 25 MG tablet Take 25 mg by mouth daily.    Marland Kitchen HYDROcodone-acetaminophen (NORCO/VICODIN) 5-325 MG tablet Take 1 tablet by mouth every 4 (four) hours as needed. 10 tablet 0  . irbesartan (AVAPRO) 150 MG tablet Take 150 mg by mouth daily.    Marland Kitchen LEVEMIR FLEXTOUCH 100 UNIT/ML Pen Inject 5-45 Units into the skin See admin instructions. Inject 5 units in the morning & 45 units at bedtime    . metFORMIN (GLUCOPHAGE) 1000 MG tablet Take 1,000 mg by mouth 2 (two) times daily with a meal.    . methotrexate 50 MG/2ML injection Inject 25 mg into the skin every Sunday.    Marland Kitchen oxybutynin (DITROPAN-XL) 10 MG 24 hr tablet Take 10 mg by mouth at bedtime.     Marland Kitchen PENNSAID 2 % SOLN Apply 1 application topically 2 (two) times daily as needed (hip pain.).    Marland Kitchen rosuvastatin (CRESTOR) 20 MG tablet Take 20 mg by mouth at bedtime.     . ticagrelor (BRILINTA) 90 MG TABS tablet Take 1 tablet (90 mg total) by mouth 2 (two) times daily. 60 tablet 3  . Venlafaxine HCl 225 MG TB24 Take 225 mg by mouth at bedtime.     No current facility-administered medications for  this visit.     ROS: See HPI for pertinent positives and negatives.   Physical Examination  Vitals:   05/29/19 0845  BP: 134/84  Pulse: 78  Resp: 16  Temp: 99 F (37.2 C)  TempSrc: Temporal   There is no height or weight on file to calculate BMI.  General: A&O x 3, WDWN, female. Gait: normal HENT: No gross abnormalities.  Eyes: Pupils are equal and round Pulmonary: Respirations are non labored, CTAB, good air movement in all fields Cardiac: regular rhythm, no detected murmur.         Carotid Bruits Right Left   Negative Positive   Radial pulses are 2+ palpable bilaterally   Adominal aortic pulse is not palpable                         VASCULAR EXAM: Extremities without ischemic changes, without Gangrene; without open wounds. Left great toe tip is slightly dark, but improved and healed wound per pt.  LE Pulses Right Left       FEMORAL  2+ palpable  2+ palpable        POPLITEAL  2+ palpable   2+ palpable       POSTERIOR TIBIAL  2+ palpable   1+ palpable        DORSALIS PEDIS      ANTERIOR TIBIAL 2+ palpable  1+ palpable    Abdomen: soft, NT, no palpable masses. Skin: no rashes, no cellulitis, no ulcers noted. Musculoskeletal: no muscle wasting or atrophy. Enlarged joints of hands, mild deformities.  Neurologic: A&O X 3; appropriate affect, Sensation is normal; MOTOR FUNCTION:  moving all extremities equally, motor strength 5/5 throughout. Speech is fluent/normal. CN 2-12 intact. Psychiatric: Thought content is racing, scattered initially, then able to focus, mood is anxious    DATA   Left Aortoiliac Duplex (05-29-19): Left Stent(s): +-------------------+--------+--------+---------+--------+ common iliac arteryPSV cm/sStenosisWaveform Comments +-------------------+--------+--------+---------+--------+ Prox to Stent      114              triphasic         +-------------------+--------+--------+---------+--------+ Proximal Stent     156             triphasic         +-------------------+--------+--------+---------+--------+ Mid Stent          174             triphasic         +-------------------+--------+--------+---------+--------+ Distal Stent       130             triphasic         +-------------------+--------+--------+---------+--------+ Distal to Stent    119             triphasic         +-------------------+--------+--------+---------+--------+ Summary: Stenosis: +-----------------+-----------+ Location         Stent       +-----------------+-----------+ Left Common Iliacno stenosis +-----------------+-----------+ Widely patent left common iliac artery stent.     ABI (Date: 05/29/2019): ABI Findings: +---------+------------------+-----+---------+--------+ Right    Rt Pressure (mmHg)IndexWaveform Comment  +---------+------------------+-----+---------+--------+ Brachial 150                                      +---------+------------------+-----+---------+--------+ PTA      185               1.22 triphasic         +---------+------------------+-----+---------+--------+ DP       177               1.16 triphasic         +---------+------------------+-----+---------+--------+ Great Toe118               0.78                   +---------+------------------+-----+---------+--------+  +--------+------------------+-----+---------+-------+ Left    Lt Pressure (mmHg)IndexWaveform Comment +--------+------------------+-----+---------+-------+ [id number]                                     +--------+------------------+-----+---------+-------+ PTA     175               1.15 triphasic        +--------+------------------+-----+---------+-------+ DP      140  0.92 biphasic          +--------+------------------+-----+---------+-------+  +-------+-----------+-----------+------------+------------+ ABI/TBIToday's ABIToday's TBIPrevious ABIPrevious TBI +-------+-----------+-----------+------------+------------+ Right  1.22       0.78       1.23        0.77         +-------+-----------+-----------+------------+------------+ Left   1.15                  0.65                     +-------+-----------+-----------+------------+------------+  Right ABIs appear essentially unchanged. Left ABIs appear increased.   Summary: Right: Resting right ankle-brachial index is within normal range. No evidence of significant right lower extremity arterial disease. The right toe-brachial index is normal.  Left: Resting left ankle-brachial index is within normal range. No evidence of significant left lower extremity arterial disease.   ASSESSMENT: Karen Wu is a 53 y.o. 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 Donzetta Matters. She then required mechanical thrombectomy and stenting of left common iliac artery on 04-07-19 by Dr. Trula Slade for an occluded left iliac stent.  Since the above interventions her left great toe wound has healed and she no longer has pain in hr left great toe. Left aortoiliac duplex today shows a widely patent left common iliac artery stent with all triphasic waveforms. ABI's today are normal bilaterally, triphasic waveforms in the right, tri and biphasic waveforms in the left.   No carotid duplex result on file, states she has had TIA's, and states she was hospitalized in [facility] about 5 weeks after a TIA, will obtain carotid duplex on her return in 3 months.   Her atherosclerotic risk factors include uncontrolled DM and current and long term smoker. She takes Brilinta, ASA, and a statin.    PLAN:  Referred to [facility] for DM feet care and PAD. Based on the patient's vascular  studies and examination, pt will return to clinic in 3 months with carotid duplex, left aortoiliac duplex, and ABI's.  I advised her to notify us if she develops concerns re the circulation in her feet or legs.  Walk at least 30 minutes daily in a safe environment.  Over 3 minutes was spent counseling patient re smoking cessation, and patient was given several free resources re smoking cessation.  I discussed in depth with the patient the nature of atherosclerosis, and emphasized the importance of maximal medical management including strict control of blood pressure, blood glucose, and lipid levels, obtaining regular exercise, and cessation of smoking.  The patient is aware that without maximal medical management the underlying atherosclerotic disease process will progress, limiting the benefit of any interventions.  The patient was given information about PAD including signs, symptoms, treatment, what symptoms should prompt the patient to seek immediate medical care, and risk reduction measures to take.  Clemon Chambers, RN, MSN, FNP-C [facility] [employer] Phone: [phone]  Clinic MD: Donzetta Matters  05/29/19 9:17 AM

## 2019-05-29 NOTE — Patient Instructions (Signed)

## 2019-06-02 ENCOUNTER — Other Ambulatory Visit: Payer: Self-pay

## 2019-06-02 DIAGNOSIS — I739 Peripheral vascular disease, unspecified: Secondary | ICD-10-CM

## 2019-06-02 DIAGNOSIS — I6529 Occlusion and stenosis of unspecified carotid artery: Secondary | ICD-10-CM

## 2019-06-02 DIAGNOSIS — I745 Embolism and thrombosis of iliac artery: Secondary | ICD-10-CM

## 2019-06-03 ENCOUNTER — Other Ambulatory Visit: Payer: Self-pay

## 2019-06-03 ENCOUNTER — Ambulatory Visit (INDEPENDENT_AMBULATORY_CARE_PROVIDER_SITE_OTHER): Payer: Federal, State, Local not specified - PPO | Admitting: Podiatry

## 2019-06-03 ENCOUNTER — Encounter: Payer: Self-pay | Admitting: Podiatry

## 2019-06-03 DIAGNOSIS — E1140 Type 2 diabetes mellitus with diabetic neuropathy, unspecified: Secondary | ICD-10-CM | POA: Insufficient documentation

## 2019-06-03 DIAGNOSIS — E1142 Type 2 diabetes mellitus with diabetic polyneuropathy: Secondary | ICD-10-CM | POA: Diagnosis not present

## 2019-06-03 DIAGNOSIS — E1159 Type 2 diabetes mellitus with other circulatory complications: Secondary | ICD-10-CM | POA: Insufficient documentation

## 2019-06-03 NOTE — Progress Notes (Signed)
This patient presents the office for an evaluation of her diabetic feet.  Patient states that she has a history of having stents placed into her left leg dating back to September this year.  She states she still has episodes of purplish looking toes left foot.  She states that she had vascular complications following a nail surgery performed in [place].  She was finally diagnosed with vascular problems and surgery for the correction of her vascular problems left leg.  This patient is a diabetic and is taking Neurontin.  She presents the office today for diabetic foot exam.  Patient states that when she arrived her smaller toes on her left foot were purplish in color.  Vascular  Dorsalis pedis and posterior tibial pulses are palpable  Right foot.  Dorsalis pedis pulses ia absent left and her posterior tibial pulse left foot is palpable..  Capillary return  WNL.  Temperature gradient is  WNL.  Skin turgor  WNL  Sensorium  Senn Weinstein monofilament wire  WNL. Normal tactile sensation.  Nail Exam  Patient has normal nails with no evidence of bacterial or fungal infection.  Orthopedic  Exam  Muscle tone and muscle strength  WNL.  No limitations of motion feet  B/L.  No crepitus or joint effusion noted.  Foot type is unremarkable and digits show no abnormalities.  Mild  HAV  B/L.  Skin  No open lesions.  Normal skin texture and turgor.  Diabetes with neuropathy S/P vascular surgery.  IE.  Diabetic foot exam was performed and her vascular and neurologic findings were acceptable.  She is to continue to follow-up with her vascular doctors concerning the stents that were placed in her left leg.  Return to clinic as needed for follow-up diabetic foot exam   Gardiner Barefoot DPM

## 2019-06-08 ENCOUNTER — Ambulatory Visit: Payer: Federal, State, Local not specified - PPO | Admitting: Psychiatry

## 2019-06-10 ENCOUNTER — Encounter: Payer: Self-pay | Admitting: Adult Health

## 2019-06-10 ENCOUNTER — Ambulatory Visit (INDEPENDENT_AMBULATORY_CARE_PROVIDER_SITE_OTHER): Payer: Federal, State, Local not specified - PPO | Admitting: Adult Health

## 2019-06-10 ENCOUNTER — Other Ambulatory Visit: Payer: Self-pay

## 2019-06-10 DIAGNOSIS — F331 Major depressive disorder, recurrent, moderate: Secondary | ICD-10-CM

## 2019-06-10 DIAGNOSIS — E1165 Type 2 diabetes mellitus with hyperglycemia: Secondary | ICD-10-CM | POA: Diagnosis not present

## 2019-06-10 DIAGNOSIS — Z6828 Body mass index (BMI) 28.0-28.9, adult: Secondary | ICD-10-CM | POA: Diagnosis not present

## 2019-06-10 DIAGNOSIS — G4700 Insomnia, unspecified: Secondary | ICD-10-CM | POA: Diagnosis not present

## 2019-06-10 DIAGNOSIS — F411 Generalized anxiety disorder: Secondary | ICD-10-CM

## 2019-06-10 DIAGNOSIS — F39 Unspecified mood [affective] disorder: Secondary | ICD-10-CM

## 2019-06-10 DIAGNOSIS — F410 Panic disorder [episodic paroxysmal anxiety] without agoraphobia: Secondary | ICD-10-CM

## 2019-06-10 DIAGNOSIS — F419 Anxiety disorder, unspecified: Secondary | ICD-10-CM | POA: Diagnosis not present

## 2019-06-10 DIAGNOSIS — I10 Essential (primary) hypertension: Secondary | ICD-10-CM | POA: Diagnosis not present

## 2019-06-10 MED ORDER — OLANZAPINE 5 MG PO TABS: ORAL_TABLET | ORAL | 2 refills | Status: DC

## 2019-06-10 NOTE — Progress Notes (Signed)
[facility] MD/PA/NP Initial Note  06/10/2019 9:17 AM Karen Wu  MRN:  [id number]  Chief Complaint:   HPI:   Describes mood today as "so-so". Having "crying spells for no reason". Mood symptoms - reports depression, anxiety, and irritability. Mood declined during the "time change". Mood can change from one minute to the next. Can be very talkative at times over "random" things. Mind races throughout the day. Has been prescribed Xanax, but rarely takes it because she doesn't want to get addicted. Has been on Effexor for many years. Lack of sleep making things "worse". Having a lot of health issues and that is "worrying" her. Out of work currently - 2 weeks. Lost temper with post master recently over "Covid" restrictions - "she can be smart at times". Stating "I don't usually act like that".  Has a new post master and doesn't feel she is respectful. Previous post Restaurant manager, fast food was "very difficult to deal with". Has been working with current Market researcher for a year. Stable interest and motivation. Taking medications as prescribed.  Energy levels stable. Active, does not have a regular exercise routine. 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 Enjoys some usual interests and activities. Married x 34 years. Has 3 children - one at home - 69, 36, and 20. Son lives with them. Has 4 grandchildren. Spending time with family. Other family local. Appetite adequate. Weight stable. Sleeps better some nights than others. Averages 2 to 3 hours a night. Has not slept through the night in the past 6 years. Has tried the OTC sleep aid. Mind wanders all over the place. Mind won't "slow down" so she can relax. Has been "going on for the past 2 years".  Focus and concentration difficulties. Likes to read -can't remember what she read from one page to the next "sometimes". Completing tasks. Managing aspects of household.  Denies SI or HI. Denies AH or VH.   Previous medications: Paxil  Visit  Diagnosis:    ICD-10-CM   1. Generalized anxiety disorder  F41.1 OLANZapine (ZYPREXA) 5 MG tablet  2. Panic attacks  F41.0 OLANZapine (ZYPREXA) 5 MG tablet  3. Major depressive disorder, recurrent episode, moderate (HCC)  F33.1   4. Insomnia, unspecified type  G47.00 OLANZapine (ZYPREXA) 5 MG tablet  5. Episodic mood disorder (HCC)  F39 OLANZapine (ZYPREXA) 5 MG tablet    Past Psychiatric History: Denies psychiatric hospitalizations.  Past Medical History:  Past Medical History:  Diagnosis Date  . Arthritis    hips, knees, lower back  . Asthma    rare inhaler use  . Brain tumor ([place])    x 2 - appear to be meningiomas, per neurosurgeon's report  . Dental crown present   . Diabetes ([place])   . Family history of adverse reaction to anesthesia    pt's father woke up during surgery; pt's mother has hx. of being hard to wake up post-op  . GERD (gastroesophageal reflux disease)    OTC as needed  . Graves' disease    no current med.  Marland Kitchen Heart murmur    as a child, states no problems  . History of anemia    no problems since endometrial ablation  . Hypertension    under control with meds., per pt.; has been on med. x 21 yr.  . Insulin dependent diabetes mellitus   . Trigger finger of right hand 10/2014   thumb and long finger    Past Surgical History:  Procedure  Laterality Date  . ABDOMINAL AORTOGRAM W/LOWER EXTREMITY Bilateral 03/16/2019   Procedure: ABDOMINAL AORTOGRAM W/LOWER EXTREMITY;  Surgeon: Waynetta Sandy, MD;  Location: [facility];  Service: Cardiovascular;  Laterality: Bilateral;  . BLADDER SUSPENSION  01/2014  . BREAST MASS EXCISION Left 05/30/2005  . ENDOMETRIAL ABLATION  01/2014  . FOOT SURGERY Left    exc. cyst and bone fragments  . LOWER EXTREMITY ANGIOGRAPHY Bilateral 04/07/2019   Procedure: LOWER EXTREMITY ANGIOGRAPHY;  Surgeon: Serafina Mitchell, MD;  Location: [facility];  Service: Cardiovascular;  Laterality: Bilateral;  .  MICROLARYNGOSCOPY WITH CO2 LASER AND EXCISION OF VOCAL CORD LESION  03/2014  . PERIPHERAL VASCULAR INTERVENTION Left 03/16/2019   Procedure: PERIPHERAL VASCULAR INTERVENTION;  Surgeon: Waynetta Sandy, MD;  Location: [facility];  Service: Cardiovascular;  Laterality: Left;  Iliac  . PERIPHERAL VASCULAR INTERVENTION Left 04/07/2019   Procedure: PERIPHERAL VASCULAR INTERVENTION;  Surgeon: Serafina Mitchell, MD;  Location: [facility];  Service: Cardiovascular;  Laterality: Left;  Common Iliac and Thrombectomy  . SHOULDER ARTHROSCOPY WITH ROTATOR CUFF REPAIR Right   . TONSILLECTOMY AND ADENOIDECTOMY    . TRIGGER FINGER RELEASE Right 10/28/2014   Procedure: RIGHT THUMB AND LONG FINGER RELEASE TRIGGER ;  Surgeon: Leanora Cover, MD;  Location: [facility];  Service: Orthopedics;  Laterality: Right;  . TUBAL LIGATION    . UMBILICAL HERNIA REPAIR      Family Psychiatric History: Father and mother has panic attacks and anxiey. Both grandmothers had anxiety and depression.   Family History:  Family History  Problem Relation Age of Onset  . Anesthesia problems Mother        hard to wake up post-op  . Heart disease Mother   . Kidney failure Mother   . Stroke Mother   . Heart attack Mother   . Diabetes Mother   . Anesthesia problems Father        woke up during surgery  . Diabetes Father   . High blood pressure Father   . Kidney failure Maternal Grandmother   . Heart disease Maternal Grandfather   . Diabetes Maternal Grandfather   . Stroke Paternal Grandmother     Social History:  Social History   Socioeconomic History  . Marital status: Married    Spouse name: Not on file  . Number of children: Not on file  . Years of education: Not on file  . Highest education level: Not on file  Occupational History  . Not on file  Social Needs  . Financial resource strain: Not on file  . Food insecurity    Worry: Not on file    Inability: Not on file  .  Transportation needs    Medical: Not on file    Non-medical: Not on file  Tobacco Use  . Smoking status: Former Smoker    Packs/day: 1.50    Years: 30.00    Pack years: 45.00    Types: Cigarettes    Quit date: 02/2019    Years since quitting: 0.3  . Smokeless tobacco: Never Used  Substance and Sexual Activity  . Alcohol use: No  . Drug use: No  . Sexual activity: Not on file  Lifestyle  . Physical activity    Days per week: Not on file    Minutes per session: Not on file  . Stress: Not on file  Relationships  . Social connections    Talks on phone: Not on file  Gets together: Not on file    Attends religious service: Not on file    Active member of club or organization: Not on file    Attends meetings of clubs or organizations: Not on file    Relationship status: Not on file  Other Topics Concern  . Not on file  Social History Narrative  . Not on file    Allergies:  Allergies  Allergen Reactions  . Cortisone Anaphylaxis and Shortness Of Breath    Metabolic Disorder Labs: Lab Results  Component Value Date   HGBA1C 8.0 (H) 03/16/2019   MPG 182.9 03/16/2019   No results found for: PROLACTIN No results found for: CHOL, TRIG, HDL, CHOLHDL, VLDL, LDLCALC No results found for: TSH  Therapeutic Level Labs: No results found for: LITHIUM No results found for: VALPROATE No components found for:  CBMZ  Current Medications: Current Outpatient Medications  Medication Sig Dispense Refill  . Albuterol Sulfate (PROAIR RESPICLICK) [id number] (90 Base) MCG/ACT AEPB Inhale 1-2 puffs into the lungs 3 (three) times daily as needed (wheezing/shortness of breath).     . ALPRAZolam (NIRAVAM) 0.25 MG dissolvable tablet Take 0.25 mg by mouth daily as needed.    Marland Kitchen amLODipine (NORVASC) 5 MG tablet Take 5 mg by mouth daily.    Marland Kitchen aspirin EC 81 MG tablet Take 81 mg by mouth daily.     . empagliflozin (JARDIANCE) 25 MG TABS tablet Take 25 mg by mouth daily.     . famotidine (PEPCID) 40 MG  tablet Take 40 mg by mouth at bedtime.     . fluconazole (DIFLUCAN) 150 MG tablet Take 150 mg by mouth daily.    . folic acid (FOLVITE) 1 MG tablet Take 3 mg by mouth at bedtime.     . gabapentin (NEURONTIN) 300 MG capsule Take 300 mg by mouth 2 (two) times daily.     Marland Kitchen HUMIRA PEN 40 MG/0.4ML PNKT Inject 40 mg into the skin every 14 (fourteen) days. Fridays.    . hydrochlorothiazide (HYDRODIURIL) 25 MG tablet Take 25 mg by mouth daily.    Marland Kitchen HYDROcodone-acetaminophen (NORCO/VICODIN) 5-325 MG tablet Take 1 tablet by mouth every 4 (four) hours as needed. 10 tablet 0  . irbesartan (AVAPRO) 150 MG tablet Take 150 mg by mouth daily.    Marland Kitchen LEVEMIR FLEXTOUCH 100 UNIT/ML Pen Inject 5-45 Units into the skin See admin instructions. Inject 5 units in the morning & 45 units at bedtime    . metFORMIN (GLUCOPHAGE) 1000 MG tablet Take 1,000 mg by mouth 2 (two) times daily with a meal.    . methotrexate 50 MG/2ML injection Inject 25 mg into the skin every Sunday.    Marland Kitchen OLANZapine (ZYPREXA) 5 MG tablet Take one half tablet at bedtime x 3 nights, then one tablet at bedtime. 30 tablet 2  . oxybutynin (DITROPAN-XL) 10 MG 24 hr tablet Take 10 mg by mouth at bedtime.     Marland Kitchen PENNSAID 2 % SOLN Apply 1 application topically 2 (two) times daily as needed (hip pain.).    Marland Kitchen rosuvastatin (CRESTOR) 20 MG tablet Take 20 mg by mouth at bedtime.     . ticagrelor (BRILINTA) 90 MG TABS tablet Take 1 tablet (90 mg total) by mouth 2 (two) times daily. 60 tablet 3  . TRULICITY 1.5 0000000 SOPN     . Venlafaxine HCl 225 MG TB24 Take 225 mg by mouth at bedtime.     No current facility-administered medications for this visit.  Medication Side Effects: none  Orders placed this visit:  No orders of the defined types were placed in this encounter.   Psychiatric Specialty Exam:  Review of Systems  Musculoskeletal: Negative for falls.  Neurological: Negative for tremors and seizures.  Psychiatric/Behavioral: Positive for  depression. The patient is nervous/anxious and has insomnia.     Last menstrual period 09/30/2014.There is no height or weight on file to calculate BMI.  General Appearance: Neat and Well Groomed  Eye Contact:  Good  Speech:  Clear and Coherent and Normal Rate  Volume:  Normal  Mood:  Anxious, Depressed and Irritable  Affect:  Congruent, Depressed, Full Range, Tearful and Anxious  Thought Process:  Goal Directed and Descriptions of Associations: Intact  Orientation:  Full (Time, Place, and Person)  Thought Content: Logical and Rumination   Suicidal Thoughts:  No  Homicidal Thoughts:  No  Memory:  WNL  Judgement:  Fair  Insight:  Fair  Psychomotor Activity:  NA  Concentration:  Concentration: Fair  Recall:  NA  Fund of Knowledge: Good  Language: Good  Assets:  Communication Skills Desire for Improvement Financial Resources/Insurance Housing Intimacy Leisure Time Physical Health Resilience Social Support Talents/Skills Transportation Vocational/Educational  ADL's:  Intact  Cognition: WNL  Prognosis:  Good   Screenings: MDQ - Moderate problem  Receiving Psychotherapy: No   Treatment Plan/Recommendations:   Plan:  1. Continue Effexor XR 225mg  every morning 2. Continue Xanax 0.25mg  ODT - has taken 7 of 30 in the past 2 weeks.  3. Add low dose SGA - Zyprexa 5mg  for increased anxiety and inability to sleep - Mood disoder - possible BPD per history and results of MDQ.. Will use temporarily to help initiate sleep and calm anxiety. Plan to d/c and switch to another SGA - Vraylar or Latuda. 4. Patient to remain out of work for the next 2 weeks for mood stabilization - will reassess RTW date at that time.   RTC 2 weeks  Patient advised to contact office with any questions, adverse effects, or acute worsening in signs and symptoms.  Aloha Gell, NP

## 2019-06-16 ENCOUNTER — Telehealth: Payer: Self-pay | Admitting: Adult Health

## 2019-06-16 DIAGNOSIS — G4700 Insomnia, unspecified: Secondary | ICD-10-CM

## 2019-06-16 DIAGNOSIS — F39 Unspecified mood [affective] disorder: Secondary | ICD-10-CM

## 2019-06-16 DIAGNOSIS — G459 Transient cerebral ischemic attack, unspecified: Secondary | ICD-10-CM | POA: Diagnosis not present

## 2019-06-16 MED ORDER — QUETIAPINE FUMARATE 50 MG PO TABS: 50.0000 mg | ORAL_TABLET | Freq: Every day | ORAL | 2 refills | Status: DC

## 2019-06-16 NOTE — Telephone Encounter (Signed)
Pt stated she was prescribed Olanzapine 5mg  to help her sleep. She has been taking since Thursday ,but states she is unable to stay asleep. She states she is getting up 3 to 4 times a night. Please advise.

## 2019-06-16 NOTE — Telephone Encounter (Signed)
Patient aware and instructed to follow up

## 2019-06-24 ENCOUNTER — Ambulatory Visit (INDEPENDENT_AMBULATORY_CARE_PROVIDER_SITE_OTHER): Payer: Federal, State, Local not specified - PPO | Admitting: Adult Health

## 2019-06-24 ENCOUNTER — Other Ambulatory Visit: Payer: Self-pay

## 2019-06-24 ENCOUNTER — Encounter: Payer: Self-pay | Admitting: Adult Health

## 2019-06-24 DIAGNOSIS — F410 Panic disorder [episodic paroxysmal anxiety] without agoraphobia: Secondary | ICD-10-CM

## 2019-06-24 DIAGNOSIS — F411 Generalized anxiety disorder: Secondary | ICD-10-CM

## 2019-06-24 DIAGNOSIS — F39 Unspecified mood [affective] disorder: Secondary | ICD-10-CM | POA: Diagnosis not present

## 2019-06-24 DIAGNOSIS — G4700 Insomnia, unspecified: Secondary | ICD-10-CM | POA: Diagnosis not present

## 2019-06-24 DIAGNOSIS — R531 Weakness: Secondary | ICD-10-CM | POA: Diagnosis not present

## 2019-06-24 DIAGNOSIS — F331 Major depressive disorder, recurrent, moderate: Secondary | ICD-10-CM

## 2019-06-24 DIAGNOSIS — R4781 Slurred speech: Secondary | ICD-10-CM | POA: Diagnosis not present

## 2019-06-24 DIAGNOSIS — R42 Dizziness and giddiness: Secondary | ICD-10-CM | POA: Diagnosis not present

## 2019-06-24 DIAGNOSIS — G459 Transient cerebral ischemic attack, unspecified: Secondary | ICD-10-CM | POA: Diagnosis not present

## 2019-06-24 MED ORDER — QUETIAPINE FUMARATE 50 MG PO TABS: ORAL_TABLET | ORAL | 2 refills | Status: DC

## 2019-06-24 MED ORDER — ALPRAZOLAM 0.25 MG PO TABS: 0.2500 mg | ORAL_TABLET | Freq: Every day | ORAL | 2 refills | Status: DC

## 2019-06-24 NOTE — Progress Notes (Signed)
[facility] MD/PA/NP Medication Check  06/24/2019 10:08 AM Karen Wu  MRN:  [id number]  Chief Complaint:   HPI:   Describes mood today as "not good". Pleasant. Flat. Tearful - "having crying spells for no reason". Mood symptoms - reports depression, anxiety, and irritability. Having "a lot of ups and downs". Having health issues - "I'm wearing a heart monitor for 2 weeks". Neurologist ordered it for her - "he thinks I may have had a mini stroke". Health issues are making my depression worse. Mood mostly "sad" over past few days because she can't see her grandchildren. Hopes to see them today. Stating "the make me feel better". Mind "races" - "everything I have to do, everything coming up, too much going on". Takes Xanax as needed - "I take one when I start having bad panic attacks". Out of work currently - does not feel like she is ready to return. Feels like Covid and the change of season is always weighing "heavy" on her.  Decreased interest and motivation. Taking medications as prescribed.  Energy levels low - "I have none". Active, does not have a regular exercise routine. Out of work currently. 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 Enjoys some usual interests and activities. Married x 34 years. Has 3 children - one at home - 89, 70, and 20. Son lives with them. Has 4 grandchildren. Spending time with family.  Appetite increased - "I eat more when I get depressed", "food is my comfort". Weight stable. Sleeps better some nights than others. Averages 5 hours a night of "broken sleep". Getting "up and down throughout the night". Mind continues to race. Stating "it won't slow down".  Focus and concentration difficulties. Has "difficulties watching TV shows, reading, or focusing on any one thing".  Completing tasks. Managing some aspects of household - cooking and washing dishes - husband helping out.  Denies SI or HI. Denies AH or VH   Previous  medications: Paxil  Visit Diagnosis:    ICD-10-CM   1. Insomnia, unspecified type  G47.00   2. Episodic mood disorder (HCC)  F39   3. Generalized anxiety disorder  F41.1   4. Panic attacks  F41.0   5. Major depressive disorder, recurrent episode, moderate (HCC)  F33.1     Past Psychiatric History: Denies psychiatric hospitalizations.  Past Medical History:  Past Medical History:  Diagnosis Date  . Arthritis    hips, knees, lower back  . Asthma    rare inhaler use  . Brain tumor ([place])    x 2 - appear to be meningiomas, per neurosurgeon's report  . Dental crown present   . Diabetes ([place])   . Family history of adverse reaction to anesthesia    pt's father woke up during surgery; pt's mother has hx. of being hard to wake up post-op  . GERD (gastroesophageal reflux disease)    OTC as needed  . Graves' disease    no current med.  Marland Kitchen Heart murmur    as a child, states no problems  . History of anemia    no problems since endometrial ablation  . Hypertension    under control with meds., per pt.; has been on med. x 21 yr.  . Insulin dependent diabetes mellitus   . Trigger finger of right hand 10/2014   thumb and long finger    Past Surgical History:  Procedure Laterality Date  . ABDOMINAL AORTOGRAM W/LOWER EXTREMITY Bilateral 03/16/2019  Procedure: ABDOMINAL AORTOGRAM W/LOWER EXTREMITY;  Surgeon: Waynetta Sandy, MD;  Location: [facility];  Service: Cardiovascular;  Laterality: Bilateral;  . BLADDER SUSPENSION  01/2014  . BREAST MASS EXCISION Left 05/30/2005  . ENDOMETRIAL ABLATION  01/2014  . FOOT SURGERY Left    exc. cyst and bone fragments  . LOWER EXTREMITY ANGIOGRAPHY Bilateral 04/07/2019   Procedure: LOWER EXTREMITY ANGIOGRAPHY;  Surgeon: Serafina Mitchell, MD;  Location: [facility];  Service: Cardiovascular;  Laterality: Bilateral;  . MICROLARYNGOSCOPY WITH CO2 LASER AND EXCISION OF VOCAL CORD LESION  03/2014  . PERIPHERAL VASCULAR INTERVENTION  Left 03/16/2019   Procedure: PERIPHERAL VASCULAR INTERVENTION;  Surgeon: Waynetta Sandy, MD;  Location: [facility];  Service: Cardiovascular;  Laterality: Left;  Iliac  . PERIPHERAL VASCULAR INTERVENTION Left 04/07/2019   Procedure: PERIPHERAL VASCULAR INTERVENTION;  Surgeon: Serafina Mitchell, MD;  Location: [facility];  Service: Cardiovascular;  Laterality: Left;  Common Iliac and Thrombectomy  . SHOULDER ARTHROSCOPY WITH ROTATOR CUFF REPAIR Right   . TONSILLECTOMY AND ADENOIDECTOMY    . TRIGGER FINGER RELEASE Right 10/28/2014   Procedure: RIGHT THUMB AND LONG FINGER RELEASE TRIGGER ;  Surgeon: Leanora Cover, MD;  Location: [facility];  Service: Orthopedics;  Laterality: Right;  . TUBAL LIGATION    . UMBILICAL HERNIA REPAIR      Family Psychiatric History: Father and mother has panic attacks and anxiey. Both grandmothers had anxiety and depression.   Family History:  Family History  Problem Relation Age of Onset  . Anesthesia problems Mother        hard to wake up post-op  . Heart disease Mother   . Kidney failure Mother   . Stroke Mother   . Heart attack Mother   . Diabetes Mother   . Anesthesia problems Father        woke up during surgery  . Diabetes Father   . High blood pressure Father   . Kidney failure Maternal Grandmother   . Heart disease Maternal Grandfather   . Diabetes Maternal Grandfather   . Stroke Paternal Grandmother     Social History:  Social History   Socioeconomic History  . Marital status: Married    Spouse name: Not on file  . Number of children: Not on file  . Years of education: Not on file  . Highest education level: Not on file  Occupational History  . Not on file  Social Needs  . Financial resource strain: Not on file  . Food insecurity    Worry: Not on file    Inability: Not on file  . Transportation needs    Medical: Not on file    Non-medical: Not on file  Tobacco Use  . Smoking status: Former  Smoker    Packs/day: 1.50    Years: 30.00    Pack years: 45.00    Types: Cigarettes    Quit date: 02/2019    Years since quitting: 0.3  . Smokeless tobacco: Never Used  Substance and Sexual Activity  . Alcohol use: No  . Drug use: No  . Sexual activity: Not on file  Lifestyle  . Physical activity    Days per week: Not on file    Minutes per session: Not on file  . Stress: Not on file  Relationships  . Social Herbalist on phone: Not on file    Gets together: Not on file    Attends  religious service: Not on file    Active member of club or organization: Not on file    Attends meetings of clubs or organizations: Not on file    Relationship status: Not on file  Other Topics Concern  . Not on file  Social History Narrative  . Not on file    Allergies:  Allergies  Allergen Reactions  . Cortisone Anaphylaxis and Shortness Of Breath    Metabolic Disorder Labs: Lab Results  Component Value Date   HGBA1C 8.0 (H) 03/16/2019   MPG 182.9 03/16/2019   No results found for: PROLACTIN No results found for: CHOL, TRIG, HDL, CHOLHDL, VLDL, LDLCALC No results found for: TSH  Therapeutic Level Labs: No results found for: LITHIUM No results found for: VALPROATE No components found for:  CBMZ  Current Medications: Current Outpatient Medications  Medication Sig Dispense Refill  . Albuterol Sulfate (PROAIR RESPICLICK) 123XX123 (90 Base) MCG/ACT AEPB Inhale 1-2 puffs into the lungs 3 (three) times daily as needed (wheezing/shortness of breath).     . ALPRAZolam (NIRAVAM) 0.25 MG dissolvable tablet Take 0.25 mg by mouth daily as needed.    Marland Kitchen amLODipine (NORVASC) 5 MG tablet Take 5 mg by mouth daily.    Marland Kitchen aspirin EC 81 MG tablet Take 81 mg by mouth daily.     . empagliflozin (JARDIANCE) 25 MG TABS tablet Take 25 mg by mouth daily.     . famotidine (PEPCID) 40 MG tablet Take 40 mg by mouth at bedtime.     . fluconazole (DIFLUCAN) 150 MG tablet Take 150 mg by mouth daily.    .  folic acid (FOLVITE) 1 MG tablet Take 3 mg by mouth at bedtime.     . gabapentin (NEURONTIN) 300 MG capsule Take 300 mg by mouth 2 (two) times daily.     Marland Kitchen HUMIRA PEN 40 MG/0.4ML PNKT Inject 40 mg into the skin every 14 (fourteen) days. Fridays.    . hydrochlorothiazide (HYDRODIURIL) 25 MG tablet Take 25 mg by mouth daily.    Marland Kitchen HYDROcodone-acetaminophen (NORCO/VICODIN) 5-325 MG tablet Take 1 tablet by mouth every 4 (four) hours as needed. 10 tablet 0  . irbesartan (AVAPRO) 150 MG tablet Take 150 mg by mouth daily.    Marland Kitchen LEVEMIR FLEXTOUCH 100 UNIT/ML Pen Inject 5-45 Units into the skin See admin instructions. Inject 5 units in the morning & 45 units at bedtime    . metFORMIN (GLUCOPHAGE) 1000 MG tablet Take 1,000 mg by mouth 2 (two) times daily with a meal.    . methotrexate 50 MG/2ML injection Inject 25 mg into the skin every Sunday.    Marland Kitchen oxybutynin (DITROPAN-XL) 10 MG 24 hr tablet Take 10 mg by mouth at bedtime.     Marland Kitchen PENNSAID 2 % SOLN Apply 1 application topically 2 (two) times daily as needed (hip pain.).    Marland Kitchen QUEtiapine (SEROQUEL) 50 MG tablet Take 1 tablet (50 mg total) by mouth at bedtime. 30 tablet 2  . rosuvastatin (CRESTOR) 20 MG tablet Take 20 mg by mouth at bedtime.     . ticagrelor (BRILINTA) 90 MG TABS tablet Take 1 tablet (90 mg total) by mouth 2 (two) times daily. 60 tablet 3  . TRULICITY 1.5 0000000 SOPN     . Venlafaxine HCl 225 MG TB24 Take 225 mg by mouth at bedtime.     No current facility-administered medications for this visit.     Medication Side Effects: none  Orders placed this visit:  No orders of  the defined types were placed in this encounter.   Psychiatric Specialty Exam:  Review of Systems  Musculoskeletal: Negative for falls.  Neurological: Negative for tremors and seizures.  Psychiatric/Behavioral: Positive for depression. The patient is nervous/anxious and has insomnia.     Last menstrual period 09/30/2014.There is no height or weight on file to  calculate BMI.  General Appearance: Neat and Well Groomed  Eye Contact:  Good  Speech:  Clear and Coherent and Normal Rate  Volume:  Normal  Mood:  Anxious, Depressed and Irritable  Affect:  Congruent, Depressed, Full Range, Tearful and Anxious  Thought Process:  Goal Directed and Descriptions of Associations: Intact  Orientation:  Full (Time, Place, and Person)  Thought Content: Logical and Rumination   Suicidal Thoughts:  No  Homicidal Thoughts:  No  Memory:  WNL  Judgement:  Fair  Insight:  Fair  Psychomotor Activity:  NA  Concentration:  Concentration: Fair  Recall:  NA  Fund of Knowledge: Good  Language: Good  Assets:  Communication Skills Desire for Improvement Financial Resources/Insurance Housing Intimacy Leisure Time Physical Health Resilience Social Support Talents/Skills Transportation Vocational/Educational  ADL's:  Intact  Cognition: WNL  Prognosis:  Good   Screenings: MDQ - Moderate problem  Receiving Psychotherapy: No   Treatment Plan/Recommendations:   Plan:  1. Continue Effexor XR 225mg  every morning 2. Continue Xanax 0.25mg  ODT - prn panic attacks 3. Increase Seroquel 50mg  to 100mg  x 3 nights, then 150mg  at hs.   4. Patient to remain out of work from 06/24/2019 to 07/22/2019 for mood stabilization - will reassess RTW date at that time.   RTC 4 weeks  Patient advised to contact office with any questions, adverse effects, or acute worsening in signs and symptoms.  Patient advised to contact office with any questions, adverse effects, or acute worsening in signs and symptoms.  Aloha Gell, NP

## 2019-07-13 DIAGNOSIS — H748X1 Other specified disorders of right middle ear and mastoid: Secondary | ICD-10-CM | POA: Diagnosis not present

## 2019-07-13 DIAGNOSIS — D320 Benign neoplasm of cerebral meninges: Secondary | ICD-10-CM | POA: Diagnosis not present

## 2019-07-13 DIAGNOSIS — R42 Dizziness and giddiness: Secondary | ICD-10-CM | POA: Diagnosis not present

## 2019-07-14 DIAGNOSIS — R200 Anesthesia of skin: Secondary | ICD-10-CM | POA: Diagnosis not present

## 2019-07-14 DIAGNOSIS — R202 Paresthesia of skin: Secondary | ICD-10-CM | POA: Diagnosis not present

## 2019-07-14 DIAGNOSIS — R42 Dizziness and giddiness: Secondary | ICD-10-CM | POA: Diagnosis not present

## 2019-07-17 ENCOUNTER — Other Ambulatory Visit: Payer: Self-pay | Admitting: Adult Health

## 2019-07-17 DIAGNOSIS — G4700 Insomnia, unspecified: Secondary | ICD-10-CM

## 2019-07-17 DIAGNOSIS — F39 Unspecified mood [affective] disorder: Secondary | ICD-10-CM

## 2019-07-17 DIAGNOSIS — F331 Major depressive disorder, recurrent, moderate: Secondary | ICD-10-CM

## 2019-07-22 ENCOUNTER — Encounter: Payer: Self-pay | Admitting: Adult Health

## 2019-07-22 ENCOUNTER — Other Ambulatory Visit: Payer: Self-pay

## 2019-07-22 ENCOUNTER — Ambulatory Visit (INDEPENDENT_AMBULATORY_CARE_PROVIDER_SITE_OTHER): Payer: Federal, State, Local not specified - PPO | Admitting: Adult Health

## 2019-07-22 DIAGNOSIS — F411 Generalized anxiety disorder: Secondary | ICD-10-CM

## 2019-07-22 DIAGNOSIS — F410 Panic disorder [episodic paroxysmal anxiety] without agoraphobia: Secondary | ICD-10-CM

## 2019-07-22 DIAGNOSIS — F331 Major depressive disorder, recurrent, moderate: Secondary | ICD-10-CM

## 2019-07-22 DIAGNOSIS — G4700 Insomnia, unspecified: Secondary | ICD-10-CM | POA: Diagnosis not present

## 2019-07-22 DIAGNOSIS — F39 Unspecified mood [affective] disorder: Secondary | ICD-10-CM | POA: Diagnosis not present

## 2019-07-22 NOTE — Progress Notes (Signed)
[name] [id number] October 26, 1965 54 y.o.  Subjective:   Patient ID:  Karen Wu is a 54 y.o. (DOB Feb 07, 1966) female.  Chief Complaint: No chief complaint on file.   HPI   Karen Wu presents to the office today for follow-up of MDD, GAD, insomnia, panic attacks, Episodic mood disorder.   Describes mood today as "ok". Pleasant. Mood symptoms - denies depression, anxiety, and irritability. Feels more "level". Denies panic attacks. Decreased worry over health issues. Found out she is not having "mini strokes". Stating "I'm not doing great, but better". Enjoyed spending time with family over holidays. Feels like she is ready to return to work. Improved interest and motivation. Taking medications as prescribed.  Energy levels stable. Active, does not have a regular exercise routine. 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 Enjoys some usual interests and activities. Married. Lives with husband of 48 years. Has 3 children - one at home - 65, 25, and 20. Son lives with them. Has 4 grandchildren.  Appetite increased. Weight gain 4 pounds over the holiday. Sleeps better some nights than others. Averages 5 hours - up and down at night.   Focus and concentration stable. Completing tasks. Managing some aspects of household.   Denies SI or HI. Denies AH or VH   Previous medications: Paxil  Review of Systems:  Review of Systems  Musculoskeletal: Negative for gait problem.  Neurological: Negative for tremors.  Psychiatric/Behavioral:       Please refer to HPI    Medications: I have reviewed the patient's current medications.  Current Outpatient Medications  Medication Sig Dispense Refill  . Albuterol Sulfate (PROAIR RESPICLICK) 123XX123 (90 Base) MCG/ACT AEPB Inhale 1-2 puffs into the lungs 3 (three) times daily as needed (wheezing/shortness of breath).     . ALPRAZolam (XANAX) 0.25 MG tablet Take 1 tablet (0.25 mg total) by mouth daily. 30 tablet 2  .  amLODipine (NORVASC) 5 MG tablet Take 5 mg by mouth daily.    Marland Kitchen aspirin EC 81 MG tablet Take 81 mg by mouth daily.     . empagliflozin (JARDIANCE) 25 MG TABS tablet Take 25 mg by mouth daily.     . famotidine (PEPCID) 40 MG tablet Take 40 mg by mouth at bedtime.     . fluconazole (DIFLUCAN) 150 MG tablet Take 150 mg by mouth daily.    . folic acid (FOLVITE) 1 MG tablet Take 3 mg by mouth at bedtime.     . gabapentin (NEURONTIN) 300 MG capsule Take 300 mg by mouth 2 (two) times daily.     Marland Kitchen HUMIRA PEN 40 MG/0.4ML PNKT Inject 40 mg into the skin every 14 (fourteen) days. Fridays.    . hydrochlorothiazide (HYDRODIURIL) 25 MG tablet Take 25 mg by mouth daily.    Marland Kitchen HYDROcodone-acetaminophen (NORCO/VICODIN) 5-325 MG tablet Take 1 tablet by mouth every 4 (four) hours as needed. 10 tablet 0  . irbesartan (AVAPRO) 150 MG tablet Take 150 mg by mouth daily.    Marland Kitchen LEVEMIR FLEXTOUCH 100 UNIT/ML Pen Inject 5-45 Units into the skin See admin instructions. Inject 5 units in the morning & 45 units at bedtime    . metFORMIN (GLUCOPHAGE) 1000 MG tablet Take 1,000 mg by mouth 2 (two) times daily with a meal.    . methotrexate 50 MG/2ML injection Inject 25 mg into the skin every Sunday.    Marland Kitchen oxybutynin (DITROPAN-XL) 10 MG 24 hr tablet Take 10 mg  by mouth at bedtime.     Marland Kitchen PENNSAID 2 % SOLN Apply 1 application topically 2 (two) times daily as needed (hip pain.).    Marland Kitchen QUEtiapine (SEROQUEL) 50 MG tablet Take two to three tablets at bedtime 90 tablet 2  . rosuvastatin (CRESTOR) 20 MG tablet Take 20 mg by mouth at bedtime.     . ticagrelor (BRILINTA) 90 MG TABS tablet Take 1 tablet (90 mg total) by mouth 2 (two) times daily. 60 tablet 3  . TRULICITY 1.5 0000000 SOPN     . Venlafaxine HCl 225 MG TB24 Take 225 mg by mouth at bedtime.     No current facility-administered medications for this visit.    Medication Side Effects: None  Allergies:  Allergies  Allergen Reactions  . Cortisone Anaphylaxis and Shortness Of  Breath    Past Medical History:  Diagnosis Date  . Arthritis    hips, knees, lower back  . Asthma    rare inhaler use  . Brain tumor ([place])    x 2 - appear to be meningiomas, per neurosurgeon's report  . Dental crown present   . Diabetes ([place])   . Family history of adverse reaction to anesthesia    pt's father woke up during surgery; pt's mother has hx. of being hard to wake up post-op  . GERD (gastroesophageal reflux disease)    OTC as needed  . Graves' disease    no current med.  Marland Kitchen Heart murmur    as a child, states no problems  . History of anemia    no problems since endometrial ablation  . Hypertension    under control with meds., per pt.; has been on med. x 21 yr.  . Insulin dependent diabetes mellitus   . Trigger finger of right hand 10/2014   thumb and long finger    Family History  Problem Relation Age of Onset  . Anesthesia problems Mother        hard to wake up post-op  . Heart disease Mother   . Kidney failure Mother   . Stroke Mother   . Heart attack Mother   . Diabetes Mother   . Anesthesia problems Father        woke up during surgery  . Diabetes Father   . High blood pressure Father   . Kidney failure Maternal Grandmother   . Heart disease Maternal Grandfather   . Diabetes Maternal Grandfather   . Stroke Paternal Grandmother     Social History   Socioeconomic History  . Marital status: Married    Spouse name: Not on file  . Number of children: Not on file  . Years of education: Not on file  . Highest education level: Not on file  Occupational History  . Not on file  Tobacco Use  . Smoking status: Former Smoker    Packs/day: 1.50    Years: 30.00    Pack years: 45.00    Types: Cigarettes    Quit date: 02/2019    Years since quitting: 0.4  . Smokeless tobacco: Never Used  Substance and Sexual Activity  . Alcohol use: No  . Drug use: No  . Sexual activity: Not on file  Other Topics Concern  . Not on file  Social History Narrative  .  Not on file   Social Determinants of Health   Financial Resource Strain:   . Difficulty of Paying Living Expenses: Not on file  Food Insecurity:   . Worried About Running  Out of Food in the Last Year: Not on file  . Ran Out of Food in the Last Year: Not on file  Transportation Needs:   . Lack of Transportation (Medical): Not on file  . Lack of Transportation (Non-Medical): Not on file  Physical Activity:   . Days of Exercise per Week: Not on file  . Minutes of Exercise per Session: Not on file  Stress:   . Feeling of Stress : Not on file  Social Connections:   . Frequency of Communication with Friends and Family: Not on file  . Frequency of Social Gatherings with Friends and Family: Not on file  . Attends Religious Services: Not on file  . Active Member of Clubs or Organizations: Not on file  . Attends Archivist Meetings: Not on file  . Marital Status: Not on file  Intimate Partner Violence:   . Fear of Current or Ex-Partner: Not on file  . Emotionally Abused: Not on file  . Physically Abused: Not on file  . Sexually Abused: Not on file    Past Medical History, Surgical history, Social history, and Family history were reviewed and updated as appropriate.   Please see review of systems for further details on the patient's review from today.   Objective:   Physical Exam:  LMP 09/30/2014 (Approximate)   Physical Exam Constitutional:      General: She is not in acute distress.    Appearance: She is well-developed.  Musculoskeletal:        General: No deformity.  Neurological:     Mental Status: She is alert and oriented to person, place, and time.     Coordination: Coordination normal.  Psychiatric:        Attention and Perception: Attention and perception normal. She does not perceive auditory or visual hallucinations.        Mood and Affect: Mood normal. Mood is not anxious or depressed. Affect is not labile, blunt, angry or inappropriate.        Speech:  Speech normal.        Behavior: Behavior normal.        Thought Content: Thought content normal. Thought content is not paranoid or delusional. Thought content does not include homicidal or suicidal ideation. Thought content does not include homicidal or suicidal plan.        Cognition and Memory: Cognition and memory normal.        Judgment: Judgment normal.     Comments: Insight intact     Lab Review:     Component Value Date/Time   NA 140 04/07/2019 0914   K 4.1 04/07/2019 0914   CL 102 04/07/2019 0914   CO2 20 (L) 03/16/2019 1757   GLUCOSE 168 (H) 04/07/2019 0914   BUN 14 04/07/2019 0914   CREATININE 0.50 04/07/2019 0914   CREATININE 0.61 09/03/2012 1008   CALCIUM 8.6 (L) 03/16/2019 1757   GFRNONAA >60 03/16/2019 1757   GFRNONAA >60 09/03/2012 1008   GFRAA >60 03/16/2019 1757   GFRAA >60 09/03/2012 1008       Component Value Date/Time   WBC 12.9 (H) 03/18/2019 1123   RBC 3.12 (L) 03/18/2019 1123   HGB 14.6 04/07/2019 0914   HCT 43.0 04/07/2019 0914   PLT 320 03/18/2019 1123   MCV 86.5 03/18/2019 1123   MCH 28.2 03/18/2019 1123   MCHC 32.6 03/18/2019 1123   RDW 16.2 (H) 03/18/2019 1123   LYMPHSABS 2.5 03/18/2019 0302   MONOABS 1.4 (H) 03/18/2019  0302   EOSABS 0.3 03/18/2019 0302   BASOSABS 0.1 03/18/2019 0302    No results found for: POCLITH, LITHIUM   No results found for: PHENYTOIN, PHENOBARB, VALPROATE, CBMZ   .res Assessment: Plan:    Plan:  1. Continue Effexor XR 225mg  every morning 2. Continue Xanax 0.25mg  ODT - prn panic attacks 3. Continue Seroquel 150mg  at hs.  May return to work without restrictions effective 07/23/2019.   RTC 4 weeks  Patient advised to contact office with any questions, adverse effects, or acute worsening in signs and symptoms.  Diagnoses and all orders for this visit:  Insomnia, unspecified type  Episodic mood disorder (HCC)  Generalized anxiety disorder  Panic attacks  Major depressive disorder, recurrent  episode, moderate ([place])     Please see After Visit Summary for patient specific instructions.  Future Appointments  Date Time Provider Junction  09/04/2019  8:00 AM [facility] - [facility]  09/04/2019  9:00 AM [facility]  09/04/2019 10:00 AM [facility] - [facility]  09/04/2019 10:15 AM [facility]    No orders of the defined types were placed in this encounter.   -------------------------------

## 2019-07-24 DIAGNOSIS — I471 Supraventricular tachycardia: Secondary | ICD-10-CM | POA: Diagnosis not present

## 2019-07-24 DIAGNOSIS — I472 Ventricular tachycardia: Secondary | ICD-10-CM | POA: Diagnosis not present

## 2019-07-30 ENCOUNTER — Other Ambulatory Visit: Payer: Self-pay | Admitting: Surgery

## 2019-08-07 ENCOUNTER — Telehealth: Payer: Self-pay | Admitting: Adult Health

## 2019-08-07 DIAGNOSIS — I4729 Other ventricular tachycardia: Secondary | ICD-10-CM | POA: Insufficient documentation

## 2019-08-07 DIAGNOSIS — I471 Supraventricular tachycardia: Secondary | ICD-10-CM | POA: Diagnosis not present

## 2019-08-07 DIAGNOSIS — I10 Essential (primary) hypertension: Secondary | ICD-10-CM | POA: Diagnosis not present

## 2019-08-07 DIAGNOSIS — I472 Ventricular tachycardia: Secondary | ICD-10-CM | POA: Diagnosis not present

## 2019-08-07 DIAGNOSIS — I739 Peripheral vascular disease, unspecified: Secondary | ICD-10-CM | POA: Insufficient documentation

## 2019-08-07 DIAGNOSIS — Z720 Tobacco use: Secondary | ICD-10-CM | POA: Insufficient documentation

## 2019-08-07 DIAGNOSIS — R55 Syncope and collapse: Secondary | ICD-10-CM | POA: Diagnosis not present

## 2019-08-07 NOTE — Telephone Encounter (Signed)
Pt requesting a letter stating it is safe for her to work and take Xanax she is taking under your care. For employer. Call pt to pick up when ready @ [phone]

## 2019-08-10 NOTE — Telephone Encounter (Signed)
Note complete and is in your box outside your door.

## 2019-08-10 NOTE — Telephone Encounter (Signed)
Are you writing the letter or do you want me to?

## 2019-08-10 NOTE — Telephone Encounter (Signed)
Yes

## 2019-08-10 NOTE — Telephone Encounter (Signed)
Ok to write letter

## 2019-08-10 NOTE — Telephone Encounter (Signed)
Can you dot it pls?

## 2019-08-13 DIAGNOSIS — I2510 Atherosclerotic heart disease of native coronary artery without angina pectoris: Secondary | ICD-10-CM | POA: Diagnosis not present

## 2019-08-13 DIAGNOSIS — I472 Ventricular tachycardia: Secondary | ICD-10-CM | POA: Diagnosis not present

## 2019-08-13 DIAGNOSIS — R000 Tachycardia, unspecified: Secondary | ICD-10-CM | POA: Diagnosis not present

## 2019-08-13 DIAGNOSIS — I471 Supraventricular tachycardia: Secondary | ICD-10-CM | POA: Diagnosis not present

## 2019-08-26 DIAGNOSIS — D496 Neoplasm of unspecified behavior of brain: Secondary | ICD-10-CM | POA: Insufficient documentation

## 2019-08-26 DIAGNOSIS — G4710 Hypersomnia, unspecified: Secondary | ICD-10-CM | POA: Insufficient documentation

## 2019-08-26 DIAGNOSIS — G4700 Insomnia, unspecified: Secondary | ICD-10-CM | POA: Diagnosis not present

## 2019-08-26 DIAGNOSIS — R0683 Snoring: Secondary | ICD-10-CM | POA: Diagnosis not present

## 2019-08-26 DIAGNOSIS — R519 Headache, unspecified: Secondary | ICD-10-CM | POA: Diagnosis not present

## 2019-08-26 DIAGNOSIS — E0500 Thyrotoxicosis with diffuse goiter without thyrotoxic crisis or storm: Secondary | ICD-10-CM | POA: Insufficient documentation

## 2019-08-30 ENCOUNTER — Other Ambulatory Visit: Payer: Self-pay | Admitting: Adult Health

## 2019-08-30 DIAGNOSIS — F410 Panic disorder [episodic paroxysmal anxiety] without agoraphobia: Secondary | ICD-10-CM

## 2019-08-30 DIAGNOSIS — F39 Unspecified mood [affective] disorder: Secondary | ICD-10-CM

## 2019-08-30 DIAGNOSIS — G4700 Insomnia, unspecified: Secondary | ICD-10-CM

## 2019-08-30 DIAGNOSIS — F411 Generalized anxiety disorder: Secondary | ICD-10-CM

## 2019-08-31 DIAGNOSIS — R1013 Epigastric pain: Secondary | ICD-10-CM | POA: Diagnosis not present

## 2019-08-31 DIAGNOSIS — K921 Melena: Secondary | ICD-10-CM | POA: Diagnosis not present

## 2019-08-31 DIAGNOSIS — E7800 Pure hypercholesterolemia, unspecified: Secondary | ICD-10-CM | POA: Diagnosis not present

## 2019-08-31 DIAGNOSIS — Z6828 Body mass index (BMI) 28.0-28.9, adult: Secondary | ICD-10-CM | POA: Diagnosis not present

## 2019-09-02 DIAGNOSIS — M05731 Rheumatoid arthritis with rheumatoid factor of right wrist without organ or systems involvement: Secondary | ICD-10-CM | POA: Insufficient documentation

## 2019-09-02 DIAGNOSIS — M05732 Rheumatoid arthritis with rheumatoid factor of left wrist without organ or systems involvement: Secondary | ICD-10-CM | POA: Insufficient documentation

## 2019-09-02 DIAGNOSIS — I10 Essential (primary) hypertension: Secondary | ICD-10-CM | POA: Diagnosis not present

## 2019-09-02 DIAGNOSIS — E0800 Diabetes mellitus due to underlying condition with hyperosmolarity without nonketotic hyperglycemic-hyperosmolar coma (NKHHC): Secondary | ICD-10-CM | POA: Diagnosis not present

## 2019-09-02 DIAGNOSIS — I471 Supraventricular tachycardia: Secondary | ICD-10-CM | POA: Diagnosis not present

## 2019-09-02 DIAGNOSIS — I472 Ventricular tachycardia: Secondary | ICD-10-CM | POA: Diagnosis not present

## 2019-09-04 ENCOUNTER — Ambulatory Visit (HOSPITAL_COMMUNITY): Payer: Federal, State, Local not specified - PPO

## 2019-09-04 ENCOUNTER — Ambulatory Visit: Payer: Federal, State, Local not specified - PPO

## 2019-09-08 ENCOUNTER — Ambulatory Visit: Payer: Federal, State, Local not specified - PPO | Admitting: Podiatry

## 2019-09-18 ENCOUNTER — Other Ambulatory Visit: Payer: Self-pay | Admitting: Adult Health

## 2019-09-18 DIAGNOSIS — Z794 Long term (current) use of insulin: Secondary | ICD-10-CM | POA: Diagnosis not present

## 2019-09-18 DIAGNOSIS — I10 Essential (primary) hypertension: Secondary | ICD-10-CM | POA: Diagnosis not present

## 2019-09-18 DIAGNOSIS — I951 Orthostatic hypotension: Secondary | ICD-10-CM | POA: Diagnosis not present

## 2019-09-18 DIAGNOSIS — J4520 Mild intermittent asthma, uncomplicated: Secondary | ICD-10-CM | POA: Diagnosis not present

## 2019-09-18 DIAGNOSIS — R42 Dizziness and giddiness: Secondary | ICD-10-CM | POA: Diagnosis not present

## 2019-09-18 DIAGNOSIS — F331 Major depressive disorder, recurrent, moderate: Secondary | ICD-10-CM

## 2019-09-18 DIAGNOSIS — E1151 Type 2 diabetes mellitus with diabetic peripheral angiopathy without gangrene: Secondary | ICD-10-CM | POA: Diagnosis not present

## 2019-09-18 DIAGNOSIS — E7800 Pure hypercholesterolemia, unspecified: Secondary | ICD-10-CM | POA: Diagnosis not present

## 2019-09-18 DIAGNOSIS — I2510 Atherosclerotic heart disease of native coronary artery without angina pectoris: Secondary | ICD-10-CM | POA: Diagnosis not present

## 2019-09-18 DIAGNOSIS — F39 Unspecified mood [affective] disorder: Secondary | ICD-10-CM

## 2019-09-18 DIAGNOSIS — I472 Ventricular tachycardia: Secondary | ICD-10-CM | POA: Diagnosis not present

## 2019-09-18 DIAGNOSIS — I959 Hypotension, unspecified: Secondary | ICD-10-CM | POA: Diagnosis not present

## 2019-09-18 DIAGNOSIS — F17210 Nicotine dependence, cigarettes, uncomplicated: Secondary | ICD-10-CM | POA: Diagnosis not present

## 2019-09-18 DIAGNOSIS — Z8673 Personal history of transient ischemic attack (TIA), and cerebral infarction without residual deficits: Secondary | ICD-10-CM | POA: Diagnosis not present

## 2019-09-18 DIAGNOSIS — M069 Rheumatoid arthritis, unspecified: Secondary | ICD-10-CM | POA: Diagnosis not present

## 2019-09-18 DIAGNOSIS — E1140 Type 2 diabetes mellitus with diabetic neuropathy, unspecified: Secondary | ICD-10-CM | POA: Diagnosis not present

## 2019-09-18 DIAGNOSIS — G4700 Insomnia, unspecified: Secondary | ICD-10-CM

## 2019-09-21 ENCOUNTER — Other Ambulatory Visit: Payer: Self-pay

## 2019-09-21 ENCOUNTER — Ambulatory Visit: Payer: Federal, State, Local not specified - PPO | Admitting: Podiatry

## 2019-09-21 DIAGNOSIS — I472 Ventricular tachycardia: Secondary | ICD-10-CM | POA: Diagnosis not present

## 2019-09-21 DIAGNOSIS — L600 Ingrowing nail: Secondary | ICD-10-CM

## 2019-09-21 DIAGNOSIS — M79676 Pain in unspecified toe(s): Secondary | ICD-10-CM

## 2019-09-21 NOTE — Progress Notes (Signed)
  Subjective:  Patient ID: Karen Wu, female    DOB: May 05, 1966,  MRN: [id number]  Chief Complaint  Patient presents with  . Nail Problem    L thallux meidial border x few wks; 5/10 tenderness -pt denie sinjuyr/swellign/driange -w/ pink colored Tx: none  . Diabetes    FB:S 148 A1C" 7    54 y.o. female presents with the above complaint. History confirmed with patient.   Objective:  Physical Exam: no trophic changes or ulcerative lesions, normal sensory exam and faint pulses left.  Painful ingrowing nail at  medial border of the left, hallux; without warmth, erythema or drainage  Assessment:   1. Ingrown nail   2. Pain around toenail      Plan:  Patient was evaluated and treated and all questions answered.  Ingrown Nail, left -Patient elects to proceed with ingrown toenail removal today -Ingrown nail excised. See procedure note. -Patient to follow up in 2 weeks for nail check.  Procedure: Excision of Ingrown Toenail Location: Left 1st toe medial nail borders. Anesthesia: Lidocaine 1% plain; 1.18mL and Marcaine 0.5% plain; 1.10mL, digital block. Skin Prep: Betadine. Dressing: Silvadene; bandaid  Technique: Following skin prep,the affected nail border was split with a nail splitter, and gently avulsed with a hemostat. Care was taken to be as atraumatic as possible. No tourniquet or exaguination was used. Lumicain applied followed by mepilex border dressing. Disposition: Patient tolerated procedure well. Patient to return in 2 weeks for follow-up.  No follow-ups on file.   MDM

## 2019-10-06 ENCOUNTER — Ambulatory Visit: Payer: Federal, State, Local not specified - PPO | Admitting: Podiatry

## 2019-10-06 ENCOUNTER — Other Ambulatory Visit: Payer: Self-pay

## 2019-10-06 DIAGNOSIS — L600 Ingrowing nail: Secondary | ICD-10-CM

## 2019-10-06 DIAGNOSIS — M79676 Pain in unspecified toe(s): Secondary | ICD-10-CM | POA: Diagnosis not present

## 2019-10-06 NOTE — Progress Notes (Signed)
  Subjective:  Patient ID: Karen Wu, female    DOB: 12-26-1965,  MRN: [id number]  Chief Complaint  Patient presents with  . nail check    F?U Lt hallux nail check pt.s tates," it's doing good." -pt denies pain/redness/swellgin/drianage Tx none    54 y.o. female presents for follow up of nail procedure. History confirmed with patient.   Objective:  Physical Exam: Ingrown nail avulsion site: overlying soft crust, no warmth, no drainage and no erythema Assessment:   1. Ingrown nail   2. Pain around toenail      Plan:  Patient was evaluated and treated and all questions answered.  S/p Ingrown Toenail Excision, left -Healing well without issue. -Discussed return precautions. -F/u PRN

## 2019-10-09 ENCOUNTER — Ambulatory Visit: Payer: Federal, State, Local not specified - PPO | Admitting: Physician Assistant

## 2019-10-09 ENCOUNTER — Ambulatory Visit (INDEPENDENT_AMBULATORY_CARE_PROVIDER_SITE_OTHER)
Admission: RE | Admit: 2019-10-09 | Discharge: 2019-10-09 | Disposition: A | Discharge status: Home / Self Care | Payer: Federal, State, Local not specified - PPO | Source: Ambulatory Visit | Attending: Family | Admitting: Family

## 2019-10-09 ENCOUNTER — Other Ambulatory Visit: Payer: Self-pay

## 2019-10-09 ENCOUNTER — Ambulatory Visit (HOSPITAL_COMMUNITY)
Admission: RE | Admit: 2019-10-09 | Discharge: 2019-10-09 | Disposition: A | Discharge status: Home / Self Care | Payer: Federal, State, Local not specified - PPO | Source: Ambulatory Visit | Attending: Vascular Surgery | Admitting: Vascular Surgery

## 2019-10-09 VITALS — BP 140/87 | HR 92 | Temp 97.1°F | Ht 66.0 in | Wt 169.2 lb

## 2019-10-09 DIAGNOSIS — I6529 Occlusion and stenosis of unspecified carotid artery: Secondary | ICD-10-CM | POA: Insufficient documentation

## 2019-10-09 DIAGNOSIS — I739 Peripheral vascular disease, unspecified: Secondary | ICD-10-CM | POA: Diagnosis not present

## 2019-10-09 DIAGNOSIS — I745 Embolism and thrombosis of iliac artery: Secondary | ICD-10-CM

## 2019-10-09 DIAGNOSIS — Z720 Tobacco use: Secondary | ICD-10-CM

## 2019-10-09 NOTE — Progress Notes (Signed)
Established Intermittent Claudication   History of Present Illness   Karen Wu is a 54 y.o. (09/06/1965) female who presents to go over vascular studies.  She underwent left common iliac artery stent graft by Dr. Donzetta Matters in August 2020.  This was noted to be occluded a month later and thus patient underwent mechanical thrombectomy of left common iliac artery stent and VBX stent by Dr. Trula Slade on 04/07/2019.  She was also switched from Plavix to Brilinta at that time.  She continues to take aspirin and Brilinta daily in addition to statin.  She continues to smoke daily however her and her husband plan to try to quit together.  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  Left great toe ulceration completely healed.    Patient also has dizzy spells for which she is being evaluated by neurology.  She denies any further strokelike symptoms including slurring speech, changes in vision, or one-sided weakness.  The patient's PMH, PSH, SH, and FamHx were reviewed and are unchanged from prior visit.  Current Outpatient Medications  Medication Sig Dispense Refill  . Albuterol Sulfate (PROAIR RESPICLICK) 123XX123 (90 Base) MCG/ACT AEPB Inhale 1-2 puffs into the lungs 3 (three) times daily as needed (wheezing/shortness of breath).     . ALPRAZolam (XANAX) 0.25 MG tablet Take 1 tablet (0.25 mg total) by mouth daily. 30 tablet 2  . amLODipine (NORVASC) 5 MG tablet Take 5 mg by mouth daily.    Marland Kitchen aspirin EC 81 MG tablet Take 81 mg by mouth daily.     Marland Kitchen BRILINTA 90 MG TABS tablet TAKE ONE TABLET BY MOUTH TWICE DAILY 60 tablet 3  . empagliflozin (JARDIANCE) 25 MG TABS tablet Take 25 mg by mouth daily.     . famotidine (PEPCID) 40 MG tablet Take 40 mg by mouth at bedtime.     . folic acid (FOLVITE) 1 MG tablet Take 3 mg by mouth at bedtime.     . gabapentin (NEURONTIN) 300 MG capsule Take 300 mg by mouth 2 (two) times daily.     Marland Kitchen HUMIRA PEN 40 MG/0.4ML PNKT Inject 40 mg into the skin  every 14 (fourteen) days. Fridays.    . hydrochlorothiazide (HYDRODIURIL) 25 MG tablet Take 25 mg by mouth daily.    Marland Kitchen HYDROcodone-acetaminophen (NORCO/VICODIN) 5-325 MG tablet Take 1 tablet by mouth every 4 (four) hours as needed. 10 tablet 0  . hydrocortisone (ANUSOL-HC) 25 MG suppository Place 25 mg rectally at bedtime.    . irbesartan (AVAPRO) 150 MG tablet Take 150 mg by mouth daily.    Marland Kitchen LEVEMIR FLEXTOUCH 100 UNIT/ML Pen Inject 5-45 Units into the skin See admin instructions. Inject 5 units in the morning & 45 units at bedtime    . metFORMIN (GLUCOPHAGE) 1000 MG tablet Take 1,000 mg by mouth 2 (two) times daily with a meal.    . methotrexate 50 MG/2ML injection Inject 25 mg into the skin every Sunday.    Marland Kitchen omeprazole (PRILOSEC) 20 MG capsule Take 20 mg by mouth every morning.    Marland Kitchen oxybutynin (DITROPAN-XL) 10 MG 24 hr tablet Take 10 mg by mouth at bedtime.     Marland Kitchen PENNSAID 2 % SOLN Apply 1 application topically 2 (two) times daily as needed (hip pain.).    Marland Kitchen QUEtiapine (SEROQUEL) 50 MG tablet TAKE 2-3 TABLETS BY MOUTH DAILY AT BEDTIME 90 tablet 2  . rosuvastatin (CRESTOR) 20 MG tablet Take 20 mg by mouth at bedtime.     Marland Kitchen  TRULICITY 1.5 0000000 SOPN     . Venlafaxine HCl 225 MG TB24 Take 225 mg by mouth at bedtime.    . fluconazole (DIFLUCAN) 150 MG tablet Take 150 mg by mouth daily.     No current facility-administered medications for this visit.    REVIEW OF SYSTEMS (negative unless checked):   Cardiac:  []  Chest pain or chest pressure? []  Shortness of breath upon activity? []  Shortness of breath when lying flat? []  Irregular heart rhythm?  Vascular:  []  Pain in calf, thigh, or hip brought on by walking? []  Pain in feet at night that wakes you up from your sleep? []  Blood clot in your veins? []  Leg swelling?  Pulmonary:  []  Oxygen at home? []  Productive cough? []  Wheezing?  Neurologic:  []  Sudden weakness in arms or legs? []  Sudden numbness in arms or legs? []  Sudden  onset of difficult speaking or slurred speech? []  Temporary loss of vision in one eye? [x]  Problems with dizziness?  Gastrointestinal:  []  Blood in stool? []  Vomited blood?  Genitourinary:  []  Burning when urinating? []  Blood in urine?  Psychiatric:  []  Major depression  Hematologic:  []  Bleeding problems? []  Problems with blood clotting?  Dermatologic:  []  Rashes or ulcers?  Constitutional:  []  Fever or chills?  Ear/Nose/Throat:  []  Change in hearing? []  Nose bleeds? []  Sore throat?  Musculoskeletal:  []  Back pain? []  Joint pain? []  Muscle pain?   Physical Examination   Vitals:   10/09/19 0909 10/09/19 0910  BP: 123/78 140/87  Pulse: 74 92  Temp: (!) 97.1 F (36.2 C)   TempSrc: Temporal   Weight: 169 lb 3.2 oz (76.7 kg)   Height: 5\' 6"  (1.676 m)    Body mass index is 27.31 kg/m.  General:  WDWN in NAD; vital signs documented above Gait: Not observed HENT: WNL, normocephalic Pulmonary: normal non-labored breathing , without Rales, rhonchi,  wheezing Cardiac: regular HR Abdomen: soft, NT, no masses Skin: without rashes Vascular Exam/Pulses:  Right Left  Radial 2+ (normal) 2+ (normal)  DP 2+ (normal) absent  PT 1+ (weak) 2+ (normal)   Extremities: without ischemic changes, without Gangrene , without cellulitis; without open wounds;  Musculoskeletal: no muscle wasting or atrophy  Neurologic: A&O X 3;  No focal weakness or paresthesias are detected Psychiatric:  The pt has Normal affect.  Non-Invasive Vascular imaging   ABI  ABI/TBIToday's ABIToday's TBIPrevious ABIPrevious TBI  +-------+-----------+-----------+------------+------------+  Right 1.32    0.90    1.22    0.78      +-------+-----------+-----------+------------+------------+  Left  1.34    0.58    1.15    0         Aortoiliac Duplex  Patent L common iliac stent  Carotid duplex B ICA 1-39% stenosis  Medical Decision Making    Karen Wu is a 54 y.o. female who presents to go over vascular studies related to PAD L CIA stent.  Also evaluated for carotid stenosis   Carotid duplex negative for any hemodynamically significant stenosis; will not repeat this study  Bilateral lower extremities well perfused with palpable pedal pulses  Aortoiliac duplex demonstrates a widely patent left common iliac artery stent graft  Continue aspirin, Brilinta, statin daily  Encouraged ambulation  Encouraged smoking cessation  Recheck aortoiliac duplex and ABIs in 6 months per protocol  Dagoberto Ligas PA-C [facility] Office: [phone]  Clinic MD: Donzetta Matters

## 2019-10-12 ENCOUNTER — Other Ambulatory Visit: Payer: Self-pay | Admitting: *Deleted

## 2019-10-12 DIAGNOSIS — Z95828 Presence of other vascular implants and grafts: Secondary | ICD-10-CM

## 2019-10-12 DIAGNOSIS — I739 Peripheral vascular disease, unspecified: Secondary | ICD-10-CM

## 2019-10-12 DIAGNOSIS — I745 Embolism and thrombosis of iliac artery: Secondary | ICD-10-CM

## 2019-10-14 DIAGNOSIS — K921 Melena: Secondary | ICD-10-CM | POA: Diagnosis not present

## 2019-10-17 DIAGNOSIS — J0100 Acute maxillary sinusitis, unspecified: Secondary | ICD-10-CM | POA: Diagnosis not present

## 2019-10-20 ENCOUNTER — Other Ambulatory Visit: Payer: Self-pay

## 2019-10-20 ENCOUNTER — Encounter: Payer: Self-pay | Admitting: Adult Health

## 2019-10-20 ENCOUNTER — Ambulatory Visit (INDEPENDENT_AMBULATORY_CARE_PROVIDER_SITE_OTHER): Payer: Federal, State, Local not specified - PPO | Admitting: Adult Health

## 2019-10-20 DIAGNOSIS — F331 Major depressive disorder, recurrent, moderate: Secondary | ICD-10-CM | POA: Diagnosis not present

## 2019-10-20 DIAGNOSIS — F39 Unspecified mood [affective] disorder: Secondary | ICD-10-CM

## 2019-10-20 DIAGNOSIS — F410 Panic disorder [episodic paroxysmal anxiety] without agoraphobia: Secondary | ICD-10-CM | POA: Diagnosis not present

## 2019-10-20 DIAGNOSIS — F411 Generalized anxiety disorder: Secondary | ICD-10-CM

## 2019-10-20 DIAGNOSIS — G4700 Insomnia, unspecified: Secondary | ICD-10-CM

## 2019-10-20 MED ORDER — QUETIAPINE FUMARATE 50 MG PO TABS: ORAL_TABLET | ORAL | 5 refills | Status: DC

## 2019-10-20 NOTE — Progress Notes (Signed)
Karen Wu BC:[id number] [birth year] 54 y.o.  Subjective:   Patient ID:  Karen Wu is a 54 y.o. (DOB 14-Feb-1966) female.  Chief Complaint: No chief complaint on file.   HPI Karen Wu presents to the office today for follow-up of MDD, GAD, insomnia, panic attacks, Episodic mood disorder.   Describes mood today as "ok". Pleasant. Mood symptoms - denies depression, anxiety, and irritability. Denies panic attacks. Stating "I'm doing alright". Seeing cardiology for V-tach. Work going well. Tripped planned to [place] at the end of April - taking campers. Stable interest and motivation. Taking medications as prescribed.  Energy levels stable. Active, does not have a regular exercise routine. Gets 10,000 steps a day. 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 Enjoys some usual interests and activities. Married. Lives with husband of 15 years. Has 3 children - one at home - 62, 58, and 20. Son lives with them. Has 4 grandchildren. Watching grandsons play baseball.  Appetite adequate. Weight loss 169 pounds.  Sleep has improved. Upcoming sleep study. Averages 6 to 7 hours - "feels rested". Focus and concentration stable. Completing tasks. Managing some aspects of household. Work going well. Denies SI or HI. Denies AH or VH   Previous medications: Paxil   Review of Systems:  Review of Systems  Musculoskeletal: Negative for gait problem.  Neurological: Negative for tremors.  Psychiatric/Behavioral:       Please refer to HPI    Medications: I have reviewed the patient's current medications.  Current Outpatient Medications  Medication Sig Dispense Refill  . Albuterol Sulfate (PROAIR RESPICLICK) [id number] (90 Base) MCG/ACT AEPB Inhale 1-2 puffs into the lungs 3 (three) times daily as needed (wheezing/shortness of breath).     . ALPRAZolam (XANAX) 0.25 MG tablet Take 1 tablet (0.25 mg total) by mouth daily. 30 tablet 2  . amLODipine (NORVASC) 5 MG tablet Take 5 mg by mouth daily.    Marland Kitchen aspirin EC 81 MG  tablet Take 81 mg by mouth daily.     Marland Kitchen BRILINTA 90 MG TABS tablet TAKE ONE TABLET BY MOUTH TWICE DAILY 60 tablet 3  . empagliflozin (JARDIANCE) 25 MG TABS tablet Take 25 mg by mouth daily.     . famotidine (PEPCID) 40 MG tablet Take 40 mg by mouth at bedtime.     . fluconazole (DIFLUCAN) 150 MG tablet Take 150 mg by mouth daily.    . folic acid (FOLVITE) 1 MG tablet Take 3 mg by mouth at bedtime.     . gabapentin (NEURONTIN) 300 MG capsule Take 300 mg by mouth 2 (two) times daily.     Marland Kitchen HUMIRA PEN 40 MG/0.4ML PNKT Inject 40 mg into the skin every 14 (fourteen) days. Fridays.    . hydrochlorothiazide (HYDRODIURIL) 25 MG tablet Take 25 mg by mouth daily.    Marland Kitchen HYDROcodone-acetaminophen (NORCO/VICODIN) 5-325 MG tablet Take 1 tablet by mouth every 4 (four) hours as needed. 10 tablet 0  . hydrocortisone (ANUSOL-HC) 25 MG suppository Place 25 mg rectally at bedtime.    . irbesartan (AVAPRO) 150 MG tablet Take 150 mg by mouth daily.    Marland Kitchen LEVEMIR FLEXTOUCH 100 UNIT/ML Pen Inject 5-45 Units into the skin See admin instructions. Inject 5 units in the morning & 45 units at bedtime    . metFORMIN (GLUCOPHAGE) 1000 MG tablet Take 1,000 mg by mouth 2 (two) times daily with a meal.    . methotrexate 50 MG/2ML injection Inject 25 mg into the skin every Sunday.    Marland Kitchen  omeprazole (PRILOSEC) 20 MG capsule Take 20 mg by mouth every morning.    Marland Kitchen oxybutynin (DITROPAN-XL) 10 MG 24 hr tablet Take 10 mg by mouth at bedtime.     Marland Kitchen PENNSAID 2 % SOLN Apply 1 application topically 2 (two) times daily as needed (hip pain.).    Marland Kitchen QUEtiapine (SEROQUEL) 50 MG tablet TAKE 2-3 TABLETS BY MOUTH DAILY AT BEDTIME 90 tablet 5  . rosuvastatin (CRESTOR) 20 MG tablet Take 20 mg by mouth at bedtime.     . TRULICITY 1.5 0000000 SOPN     . Venlafaxine HCl 225 MG TB24 Take 225 mg by mouth at bedtime.     No current facility-administered medications for this visit.    Medication Side Effects: None  Allergies:  Allergies  Allergen  Reactions  . Cortisone Anaphylaxis and Shortness Of Breath  . Metoprolol Tartrate Other (See Comments)    Past Medical History:  Diagnosis Date  . Arthritis    hips, knees, lower back  . Asthma    rare inhaler use  . Brain tumor ([place])    x 2 - appear to be meningiomas, per neurosurgeon's report  . Dental crown present   . Diabetes ([place])   . Family history of adverse reaction to anesthesia    pt's father woke up during surgery; pt's mother has hx. of being hard to wake up post-op  . GERD (gastroesophageal reflux disease)    OTC as needed  . Graves' disease    no current med.  Marland Kitchen Heart murmur    as a child, states no problems  . History of anemia    no problems since endometrial ablation  . Hypertension    under control with meds., per pt.; has been on med. x 21 yr.  . Insulin dependent diabetes mellitus   . Trigger finger of right hand 10/2014   thumb and long finger    Family History  Problem Relation Age of Onset  . Anesthesia problems Mother        hard to wake up post-op  . Heart disease Mother   . Kidney failure Mother   . Stroke Mother   . Heart attack Mother   . Diabetes Mother   . Anesthesia problems Father        woke up during surgery  . Diabetes Father   . High blood pressure Father   . Kidney failure Maternal Grandmother   . Heart disease Maternal Grandfather   . Diabetes Maternal Grandfather   . Stroke Paternal Grandmother     Social History   Socioeconomic History  . Marital status: Married    Spouse name: Not on file  . Number of children: Not on file  . Years of education: Not on file  . Highest education level: Not on file  Occupational History  . Not on file  Tobacco Use  . Smoking status: Former Smoker    Packs/day: 1.50    Years: 30.00    Pack years: 45.00    Types: Cigarettes    Quit date: 02/2019    Years since quitting: 0.6  . Smokeless tobacco: Never Used  Substance and Sexual Activity  . Alcohol use: No  . Drug use: No  .  Sexual activity: Not on file  Other Topics Concern  . Not on file  Social History Narrative  . Not on file   Social Determinants of Health   Financial Resource Strain:   . Difficulty of Paying Living Expenses:  Food Insecurity:   . Worried About Charity fundraiser in the Last Year:   . Arboriculturist in the Last Year:   Transportation Needs:   . Film/video editor (Medical):   Marland Kitchen Lack of Transportation (Non-Medical):   Physical Activity:   . Days of Exercise per Week:   . Minutes of Exercise per Session:   Stress:   . Feeling of Stress :   Social Connections:   . Frequency of Communication with Friends and Family:   . Frequency of Social Gatherings with Friends and Family:   . Attends Religious Services:   . Active Member of Clubs or Organizations:   . Attends Archivist Meetings:   Marland Kitchen Marital Status:   Intimate Partner Violence:   . Fear of Current or Ex-Partner:   . Emotionally Abused:   Marland Kitchen Physically Abused:   . Sexually Abused:     Past Medical History, Surgical history, Social history, and Family history were reviewed and updated as appropriate.   Please see review of systems for further details on the patient's review from today.   Objective:   Physical Exam:  LMP 09/30/2014 (Approximate)   Physical Exam Constitutional:      General: She is not in acute distress. Musculoskeletal:        General: No deformity.  Neurological:     Mental Status: She is alert and oriented to person, place, and time.     Coordination: Coordination normal.  Psychiatric:        Attention and Perception: Attention and perception normal. She does not perceive auditory or visual hallucinations.        Mood and Affect: Mood normal. Mood is not anxious or depressed. Affect is not labile, blunt, angry or inappropriate.        Speech: Speech normal.        Behavior: Behavior normal.        Thought Content: Thought content normal. Thought content is not paranoid or  delusional. Thought content does not include homicidal or suicidal ideation. Thought content does not include homicidal or suicidal plan.        Cognition and Memory: Cognition and memory normal.        Judgment: Judgment normal.     Comments: Insight intact     Lab Review:     Component Value Date/Time   NA 140 04/07/2019 0914   K 4.1 04/07/2019 0914   CL 102 04/07/2019 0914   CO2 20 (L) 03/16/2019 1757   GLUCOSE 168 (H) 04/07/2019 0914   BUN 14 04/07/2019 0914   CREATININE 0.50 04/07/2019 0914   CREATININE 0.61 09/03/2012 1008   CALCIUM 8.6 (L) 03/16/2019 1757   GFRNONAA >60 03/16/2019 1757   GFRNONAA >60 09/03/2012 1008   GFRAA >60 03/16/2019 1757   GFRAA >60 09/03/2012 1008       Component Value Date/Time   WBC 12.9 (H) 03/18/2019 1123   RBC 3.12 (L) 03/18/2019 1123   HGB 14.6 04/07/2019 0914   HCT 43.0 04/07/2019 0914   PLT 320 03/18/2019 1123   MCV 86.5 03/18/2019 1123   MCH 28.2 03/18/2019 1123   MCHC 32.6 03/18/2019 1123   RDW 16.2 (H) 03/18/2019 1123   LYMPHSABS 2.5 03/18/2019 0302   MONOABS 1.4 (H) 03/18/2019 0302   EOSABS 0.3 03/18/2019 0302   BASOSABS 0.1 03/18/2019 0302    No results found for: POCLITH, LITHIUM   No results found for: PHENYTOIN, PHENOBARB, VALPROATE, CBMZ   .  res Assessment: Plan:    Plan:  1. Continue Effexor XR 225mg  every morning 2. Continue Xanax 0.25mg  ODT - prn panic attacks - has not used since January. 3. Continue Seroquel 150mg  at hs.  RTC 6 months  Patient advised to contact office with any questions, adverse effects, or acute worsening in signs and symptoms.  Discussed potential metabolic side effects associated with atypical antipsychotics, as well as potential risk for movement side effects. Advised pt to contact office if movement side effects occur.   Discussed potential benefits, risk, and side effects of benzodiazepines to include potential risk of tolerance and dependence, as well as possible drowsiness.   Advised patient not to drive if experiencing drowsiness and to take lowest possible effective dose to minimize risk of dependence and tolerance.   Diagnoses and all orders for this visit:  Major depressive disorder, recurrent episode, moderate (HCC) -     QUEtiapine (SEROQUEL) 50 MG tablet; TAKE 2-3 TABLETS BY MOUTH DAILY AT BEDTIME  Panic attacks  Generalized anxiety disorder  Episodic mood disorder (HCC) -     QUEtiapine (SEROQUEL) 50 MG tablet; TAKE 2-3 TABLETS BY MOUTH DAILY AT BEDTIME  Insomnia, unspecified type -     QUEtiapine (SEROQUEL) 50 MG tablet; TAKE 2-3 TABLETS BY MOUTH DAILY AT BEDTIME     Please see After Visit Summary for patient specific instructions.  No future appointments.  No orders of the defined types were placed in this encounter.   -------------------------------

## 2019-10-21 DIAGNOSIS — J40 Bronchitis, not specified as acute or chronic: Secondary | ICD-10-CM | POA: Diagnosis not present

## 2019-10-21 DIAGNOSIS — J329 Chronic sinusitis, unspecified: Secondary | ICD-10-CM | POA: Diagnosis not present

## 2019-10-21 DIAGNOSIS — Z20828 Contact with and (suspected) exposure to other viral communicable diseases: Secondary | ICD-10-CM | POA: Diagnosis not present

## 2019-10-21 DIAGNOSIS — E663 Overweight: Secondary | ICD-10-CM | POA: Diagnosis not present

## 2019-10-27 DIAGNOSIS — J189 Pneumonia, unspecified organism: Secondary | ICD-10-CM | POA: Diagnosis not present

## 2019-10-27 DIAGNOSIS — J9801 Acute bronchospasm: Secondary | ICD-10-CM | POA: Diagnosis not present

## 2019-11-04 DIAGNOSIS — J3489 Other specified disorders of nose and nasal sinuses: Secondary | ICD-10-CM | POA: Diagnosis not present

## 2019-11-04 DIAGNOSIS — Z20828 Contact with and (suspected) exposure to other viral communicable diseases: Secondary | ICD-10-CM | POA: Diagnosis not present

## 2019-11-05 DIAGNOSIS — K208 Other esophagitis without bleeding: Secondary | ICD-10-CM | POA: Diagnosis not present

## 2019-11-05 DIAGNOSIS — J45909 Unspecified asthma, uncomplicated: Secondary | ICD-10-CM | POA: Diagnosis not present

## 2019-11-05 DIAGNOSIS — K229 Disease of esophagus, unspecified: Secondary | ICD-10-CM | POA: Diagnosis not present

## 2019-11-05 DIAGNOSIS — I739 Peripheral vascular disease, unspecified: Secondary | ICD-10-CM | POA: Diagnosis not present

## 2019-11-05 DIAGNOSIS — M069 Rheumatoid arthritis, unspecified: Secondary | ICD-10-CM | POA: Diagnosis not present

## 2019-11-05 DIAGNOSIS — F17200 Nicotine dependence, unspecified, uncomplicated: Secondary | ICD-10-CM | POA: Diagnosis not present

## 2019-11-05 DIAGNOSIS — E119 Type 2 diabetes mellitus without complications: Secondary | ICD-10-CM | POA: Diagnosis not present

## 2019-11-05 DIAGNOSIS — K449 Diaphragmatic hernia without obstruction or gangrene: Secondary | ICD-10-CM | POA: Diagnosis not present

## 2019-11-05 DIAGNOSIS — D649 Anemia, unspecified: Secondary | ICD-10-CM | POA: Diagnosis not present

## 2019-11-05 DIAGNOSIS — K219 Gastro-esophageal reflux disease without esophagitis: Secondary | ICD-10-CM | POA: Diagnosis not present

## 2019-11-05 DIAGNOSIS — Z1211 Encounter for screening for malignant neoplasm of colon: Secondary | ICD-10-CM | POA: Diagnosis not present

## 2019-11-05 DIAGNOSIS — K921 Melena: Secondary | ICD-10-CM | POA: Diagnosis not present

## 2019-11-05 DIAGNOSIS — K228 Other specified diseases of esophagus: Secondary | ICD-10-CM | POA: Diagnosis not present

## 2019-11-05 DIAGNOSIS — K648 Other hemorrhoids: Secondary | ICD-10-CM | POA: Diagnosis not present

## 2019-11-05 DIAGNOSIS — R1310 Dysphagia, unspecified: Secondary | ICD-10-CM | POA: Diagnosis not present

## 2019-11-05 DIAGNOSIS — K222 Esophageal obstruction: Secondary | ICD-10-CM | POA: Diagnosis not present

## 2019-11-05 DIAGNOSIS — Z7982 Long term (current) use of aspirin: Secondary | ICD-10-CM | POA: Diagnosis not present

## 2019-11-05 DIAGNOSIS — Z79899 Other long term (current) drug therapy: Secondary | ICD-10-CM | POA: Diagnosis not present

## 2019-11-05 DIAGNOSIS — G4733 Obstructive sleep apnea (adult) (pediatric): Secondary | ICD-10-CM | POA: Diagnosis not present

## 2019-11-06 DIAGNOSIS — G4733 Obstructive sleep apnea (adult) (pediatric): Secondary | ICD-10-CM | POA: Insufficient documentation

## 2019-11-12 ENCOUNTER — Other Ambulatory Visit: Payer: Self-pay | Admitting: Adult Health

## 2019-11-12 DIAGNOSIS — F331 Major depressive disorder, recurrent, moderate: Secondary | ICD-10-CM

## 2019-11-12 DIAGNOSIS — F39 Unspecified mood [affective] disorder: Secondary | ICD-10-CM

## 2019-11-12 DIAGNOSIS — G4700 Insomnia, unspecified: Secondary | ICD-10-CM

## 2019-12-02 DIAGNOSIS — I471 Supraventricular tachycardia: Secondary | ICD-10-CM | POA: Diagnosis not present

## 2019-12-02 DIAGNOSIS — R55 Syncope and collapse: Secondary | ICD-10-CM | POA: Diagnosis not present

## 2019-12-02 DIAGNOSIS — G4733 Obstructive sleep apnea (adult) (pediatric): Secondary | ICD-10-CM | POA: Diagnosis not present

## 2019-12-02 DIAGNOSIS — I10 Essential (primary) hypertension: Secondary | ICD-10-CM | POA: Diagnosis not present

## 2019-12-02 DIAGNOSIS — I472 Ventricular tachycardia: Secondary | ICD-10-CM | POA: Diagnosis not present

## 2019-12-26 ENCOUNTER — Other Ambulatory Visit: Payer: Self-pay | Admitting: Surgery

## 2020-01-02 DIAGNOSIS — G4733 Obstructive sleep apnea (adult) (pediatric): Secondary | ICD-10-CM | POA: Diagnosis not present

## 2020-01-05 DIAGNOSIS — Z9989 Dependence on other enabling machines and devices: Secondary | ICD-10-CM | POA: Diagnosis not present

## 2020-01-05 DIAGNOSIS — G4733 Obstructive sleep apnea (adult) (pediatric): Secondary | ICD-10-CM | POA: Diagnosis not present

## 2020-01-06 ENCOUNTER — Telehealth: Payer: Self-pay

## 2020-01-06 ENCOUNTER — Telehealth (HOSPITAL_COMMUNITY): Payer: Self-pay

## 2020-01-06 ENCOUNTER — Other Ambulatory Visit: Payer: Self-pay | Admitting: *Deleted

## 2020-01-06 ENCOUNTER — Other Ambulatory Visit (HOSPITAL_COMMUNITY): Payer: Self-pay | Admitting: Vascular Surgery

## 2020-01-06 DIAGNOSIS — I739 Peripheral vascular disease, unspecified: Secondary | ICD-10-CM

## 2020-01-06 DIAGNOSIS — I745 Embolism and thrombosis of iliac artery: Secondary | ICD-10-CM

## 2020-01-06 NOTE — Telephone Encounter (Signed)
Pt called with c/o L big toe "burning and painful" since last night. She states this is how it felt previously before stent. States she feels it up to her knee which is cold. She has been scheduled for an aortoiliac duplex and to see MD after. Pt verbalized understanding.

## 2020-01-06 NOTE — Telephone Encounter (Signed)

## 2020-01-07 ENCOUNTER — Encounter: Payer: Self-pay | Admitting: Vascular Surgery

## 2020-01-07 ENCOUNTER — Ambulatory Visit (INDEPENDENT_AMBULATORY_CARE_PROVIDER_SITE_OTHER): Payer: Federal, State, Local not specified - PPO | Admitting: Vascular Surgery

## 2020-01-07 ENCOUNTER — Encounter (HOSPITAL_COMMUNITY): Payer: Self-pay

## 2020-01-07 ENCOUNTER — Other Ambulatory Visit: Payer: Self-pay

## 2020-01-07 ENCOUNTER — Ambulatory Visit (HOSPITAL_COMMUNITY)
Admission: RE | Admit: 2020-01-07 | Discharge: 2020-01-07 | Disposition: A | Discharge status: Home / Self Care | Payer: Federal, State, Local not specified - PPO | Source: Ambulatory Visit | Attending: Vascular Surgery | Admitting: Vascular Surgery

## 2020-01-07 ENCOUNTER — Ambulatory Visit (INDEPENDENT_AMBULATORY_CARE_PROVIDER_SITE_OTHER)
Admission: RE | Admit: 2020-01-07 | Discharge: 2020-01-07 | Disposition: A | Discharge status: Home / Self Care | Payer: Federal, State, Local not specified - PPO | Source: Ambulatory Visit | Attending: Vascular Surgery | Admitting: Vascular Surgery

## 2020-01-07 VITALS — BP 131/79 | HR 66 | Temp 98.0°F | Resp 20 | Ht 66.0 in | Wt 167.0 lb

## 2020-01-07 DIAGNOSIS — I745 Embolism and thrombosis of iliac artery: Secondary | ICD-10-CM

## 2020-01-07 DIAGNOSIS — I739 Peripheral vascular disease, unspecified: Secondary | ICD-10-CM

## 2020-01-07 DIAGNOSIS — I471 Supraventricular tachycardia: Secondary | ICD-10-CM | POA: Diagnosis not present

## 2020-01-07 NOTE — Progress Notes (Signed)
Patient is a 54 year old female who returns for follow-up today.  She previously underwent left common iliac stenting in August 2020.  She subsequently required restenting for acute occlusion on April 05, 2019.  Since that time she has had no problems except for a few days ago.  She began to develop burning stinging numbness and tingling in her left first toe again.  She also has had a knee injury recently and is awaiting an MRI of this.  She does not really describe claudication symptoms.  She is continuing her Brilinta aspirin and statin.  Review of systems: She has no shortness of breath.  She has no chest pain.  Past Medical History:  Diagnosis Date  . Arthritis    hips, knees, lower back  . Asthma    rare inhaler use  . Brain tumor ([place])    x 2 - appear to be meningiomas, per neurosurgeon's report  . Dental crown present   . Diabetes ([place])   . Family history of adverse reaction to anesthesia    pt's father woke up during surgery; pt's mother has hx. of being hard to wake up post-op  . GERD (gastroesophageal reflux disease)    OTC as needed  . Graves' disease    no current med.  Marland Kitchen Heart murmur    as a child, states no problems  . History of anemia    no problems since endometrial ablation  . Hypertension    under control with meds., per pt.; has been on med. x 21 yr.  . Insulin dependent diabetes mellitus   . Trigger finger of right hand 10/2014   thumb and long finger    Social History   Socioeconomic History  . Marital status: Married    Spouse name: Not on file  . Number of children: Not on file  . Years of education: Not on file  . Highest education level: Not on file  Occupational History  . Not on file  Tobacco Use  . Smoking status: Former Smoker    Packs/day: 1.50    Years: 30.00    Pack years: 45.00    Types: Cigarettes    Quit date: 02/2019    Years since quitting: 0.8  . Smokeless tobacco: Never Used  Vaping Use  . Vaping Use: Never used   Substance and Sexual Activity  . Alcohol use: No  . Drug use: No  . Sexual activity: Not on file  Other Topics Concern  . Not on file  Social History Narrative  . Not on file   Social Determinants of Health   Financial Resource Strain:   . Difficulty of Paying Living Expenses:   Food Insecurity:   . Worried About Charity fundraiser in the Last Year:   . Arboriculturist in the Last Year:   Transportation Needs:   . Film/video editor (Medical):   Marland Kitchen Lack of Transportation (Non-Medical):   Physical Activity:   . Days of Exercise per Week:   . Minutes of Exercise per Session:   Stress:   . Feeling of Stress :   Social Connections:   . Frequency of Communication with Friends and Family:   . Frequency of Social Gatherings with Friends and Family:   . Attends Religious Services:   . Active Member of Clubs or Organizations:   . Attends Archivist Meetings:   Marland Kitchen Marital Status:   Intimate Partner Violence:   . Fear of Current or Ex-Partner:   .  Emotionally Abused:   Marland Kitchen Physically Abused:   . Sexually Abused:     Physical exam:  Vitals:   01/07/20 0834  BP: 131/79  Pulse: 66  Resp: 20  Temp: 98 F (36.7 C)  SpO2: 93%  Weight: 167 lb (75.8 kg)  Height: 5\' 6"  (1.676 m)    Extremities: 2+ posterior tibial pulses bilaterally  Skin: No open ulcer or rash no tenderness on palpation of the left first toe no erythema  Data: Patient had bilateral ABIs performed today which were triphasic greater than 1 bilaterally  Assessment: No significant recurrent atherosclerosis currently.  Stent seems to be patent.  Not sure of the etiology of her toe pain but this is resolving.  Plan: Patient will continue her statin aspirin and Brilinta.  We will arrange follow-up for her in September 2020 with an aortoiliac duplex and bilateral ABIs.  Ruta Hinds, MD [facility] Office: [phone]

## 2020-01-11 ENCOUNTER — Other Ambulatory Visit: Payer: Self-pay | Admitting: *Deleted

## 2020-01-11 DIAGNOSIS — I739 Peripheral vascular disease, unspecified: Secondary | ICD-10-CM

## 2020-01-25 DIAGNOSIS — M7062 Trochanteric bursitis, left hip: Secondary | ICD-10-CM | POA: Diagnosis not present

## 2020-01-25 DIAGNOSIS — M2550 Pain in unspecified joint: Secondary | ICD-10-CM | POA: Diagnosis not present

## 2020-01-25 DIAGNOSIS — M0579 Rheumatoid arthritis with rheumatoid factor of multiple sites without organ or systems involvement: Secondary | ICD-10-CM | POA: Diagnosis not present

## 2020-01-25 DIAGNOSIS — M79675 Pain in left toe(s): Secondary | ICD-10-CM | POA: Diagnosis not present

## 2020-02-01 DIAGNOSIS — G4733 Obstructive sleep apnea (adult) (pediatric): Secondary | ICD-10-CM | POA: Diagnosis not present

## 2020-02-02 DIAGNOSIS — R300 Dysuria: Secondary | ICD-10-CM | POA: Diagnosis not present

## 2020-02-02 DIAGNOSIS — R339 Retention of urine, unspecified: Secondary | ICD-10-CM | POA: Diagnosis not present

## 2020-02-02 DIAGNOSIS — N3941 Urge incontinence: Secondary | ICD-10-CM | POA: Diagnosis not present

## 2020-02-02 DIAGNOSIS — R358 Other polyuria: Secondary | ICD-10-CM | POA: Diagnosis not present

## 2020-02-06 DIAGNOSIS — G4733 Obstructive sleep apnea (adult) (pediatric): Secondary | ICD-10-CM | POA: Diagnosis not present

## 2020-02-16 DIAGNOSIS — I10 Essential (primary) hypertension: Secondary | ICD-10-CM | POA: Diagnosis not present

## 2020-02-16 DIAGNOSIS — Z6828 Body mass index (BMI) 28.0-28.9, adult: Secondary | ICD-10-CM | POA: Diagnosis not present

## 2020-02-16 DIAGNOSIS — R42 Dizziness and giddiness: Secondary | ICD-10-CM | POA: Diagnosis not present

## 2020-02-17 DIAGNOSIS — K219 Gastro-esophageal reflux disease without esophagitis: Secondary | ICD-10-CM | POA: Diagnosis not present

## 2020-02-17 DIAGNOSIS — N3941 Urge incontinence: Secondary | ICD-10-CM | POA: Diagnosis not present

## 2020-02-24 DIAGNOSIS — I471 Supraventricular tachycardia: Secondary | ICD-10-CM | POA: Diagnosis not present

## 2020-02-24 DIAGNOSIS — I472 Ventricular tachycardia: Secondary | ICD-10-CM | POA: Diagnosis not present

## 2020-02-24 DIAGNOSIS — I10 Essential (primary) hypertension: Secondary | ICD-10-CM | POA: Diagnosis not present

## 2020-02-24 DIAGNOSIS — I2510 Atherosclerotic heart disease of native coronary artery without angina pectoris: Secondary | ICD-10-CM | POA: Insufficient documentation

## 2020-02-24 DIAGNOSIS — R55 Syncope and collapse: Secondary | ICD-10-CM | POA: Diagnosis not present

## 2020-02-25 DIAGNOSIS — E0800 Diabetes mellitus due to underlying condition with hyperosmolarity without nonketotic hyperglycemic-hyperosmolar coma (NKHHC): Secondary | ICD-10-CM | POA: Diagnosis not present

## 2020-02-25 DIAGNOSIS — R410 Disorientation, unspecified: Secondary | ICD-10-CM | POA: Diagnosis not present

## 2020-02-25 DIAGNOSIS — R42 Dizziness and giddiness: Secondary | ICD-10-CM | POA: Diagnosis not present

## 2020-02-25 DIAGNOSIS — Z01419 Encounter for gynecological examination (general) (routine) without abnormal findings: Secondary | ICD-10-CM | POA: Diagnosis not present

## 2020-02-25 DIAGNOSIS — Z1151 Encounter for screening for human papillomavirus (HPV): Secondary | ICD-10-CM | POA: Diagnosis not present

## 2020-02-25 DIAGNOSIS — R87610 Atypical squamous cells of undetermined significance on cytologic smear of cervix (ASC-US): Secondary | ICD-10-CM | POA: Diagnosis not present

## 2020-02-25 DIAGNOSIS — R404 Transient alteration of awareness: Secondary | ICD-10-CM | POA: Insufficient documentation

## 2020-02-25 DIAGNOSIS — G4733 Obstructive sleep apnea (adult) (pediatric): Secondary | ICD-10-CM | POA: Diagnosis not present

## 2020-02-25 DIAGNOSIS — Z9989 Dependence on other enabling machines and devices: Secondary | ICD-10-CM | POA: Diagnosis not present

## 2020-02-25 DIAGNOSIS — N816 Rectocele: Secondary | ICD-10-CM | POA: Diagnosis not present

## 2020-02-25 DIAGNOSIS — Z794 Long term (current) use of insulin: Secondary | ICD-10-CM | POA: Diagnosis not present

## 2020-02-25 DIAGNOSIS — Z124 Encounter for screening for malignant neoplasm of cervix: Secondary | ICD-10-CM | POA: Diagnosis not present

## 2020-03-03 DIAGNOSIS — G4733 Obstructive sleep apnea (adult) (pediatric): Secondary | ICD-10-CM | POA: Diagnosis not present

## 2020-03-07 DIAGNOSIS — R404 Transient alteration of awareness: Secondary | ICD-10-CM | POA: Diagnosis not present

## 2020-03-09 DIAGNOSIS — R42 Dizziness and giddiness: Secondary | ICD-10-CM | POA: Diagnosis not present

## 2020-03-09 DIAGNOSIS — R2689 Other abnormalities of gait and mobility: Secondary | ICD-10-CM | POA: Diagnosis not present

## 2020-03-10 DIAGNOSIS — R519 Headache, unspecified: Secondary | ICD-10-CM | POA: Diagnosis not present

## 2020-03-15 DIAGNOSIS — R2689 Other abnormalities of gait and mobility: Secondary | ICD-10-CM | POA: Diagnosis not present

## 2020-03-15 DIAGNOSIS — R42 Dizziness and giddiness: Secondary | ICD-10-CM | POA: Diagnosis not present

## 2020-03-16 ENCOUNTER — Other Ambulatory Visit: Payer: Self-pay | Admitting: Adult Health

## 2020-03-16 DIAGNOSIS — F411 Generalized anxiety disorder: Secondary | ICD-10-CM

## 2020-03-16 DIAGNOSIS — F410 Panic disorder [episodic paroxysmal anxiety] without agoraphobia: Secondary | ICD-10-CM

## 2020-03-16 DIAGNOSIS — F39 Unspecified mood [affective] disorder: Secondary | ICD-10-CM

## 2020-03-16 DIAGNOSIS — G4700 Insomnia, unspecified: Secondary | ICD-10-CM

## 2020-03-24 DIAGNOSIS — R2689 Other abnormalities of gait and mobility: Secondary | ICD-10-CM | POA: Diagnosis not present

## 2020-03-24 DIAGNOSIS — R42 Dizziness and giddiness: Secondary | ICD-10-CM | POA: Diagnosis not present

## 2020-03-29 DIAGNOSIS — R2689 Other abnormalities of gait and mobility: Secondary | ICD-10-CM | POA: Diagnosis not present

## 2020-03-29 DIAGNOSIS — R42 Dizziness and giddiness: Secondary | ICD-10-CM | POA: Diagnosis not present

## 2020-04-03 DIAGNOSIS — G4733 Obstructive sleep apnea (adult) (pediatric): Secondary | ICD-10-CM | POA: Diagnosis not present

## 2020-04-05 DIAGNOSIS — R42 Dizziness and giddiness: Secondary | ICD-10-CM | POA: Diagnosis not present

## 2020-04-05 DIAGNOSIS — R2689 Other abnormalities of gait and mobility: Secondary | ICD-10-CM | POA: Diagnosis not present

## 2020-04-07 ENCOUNTER — Other Ambulatory Visit: Payer: Self-pay

## 2020-04-07 ENCOUNTER — Ambulatory Visit (INDEPENDENT_AMBULATORY_CARE_PROVIDER_SITE_OTHER): Payer: Federal, State, Local not specified - PPO | Admitting: Vascular Surgery

## 2020-04-07 ENCOUNTER — Encounter: Payer: Self-pay | Admitting: Vascular Surgery

## 2020-04-07 ENCOUNTER — Ambulatory Visit (INDEPENDENT_AMBULATORY_CARE_PROVIDER_SITE_OTHER)
Admission: RE | Admit: 2020-04-07 | Discharge: 2020-04-07 | Disposition: A | Discharge status: Home / Self Care | Payer: Federal, State, Local not specified - PPO | Source: Ambulatory Visit | Attending: Vascular Surgery | Admitting: Vascular Surgery

## 2020-04-07 ENCOUNTER — Ambulatory Visit (HOSPITAL_COMMUNITY)
Admission: RE | Admit: 2020-04-07 | Discharge: 2020-04-07 | Disposition: A | Discharge status: Home / Self Care | Payer: Federal, State, Local not specified - PPO | Source: Ambulatory Visit | Attending: Vascular Surgery | Admitting: Vascular Surgery

## 2020-04-07 VITALS — BP 115/74 | HR 69 | Temp 98.1°F | Resp 20 | Ht 66.0 in | Wt 173.0 lb

## 2020-04-07 DIAGNOSIS — I739 Peripheral vascular disease, unspecified: Secondary | ICD-10-CM

## 2020-04-07 NOTE — Progress Notes (Signed)
Patient is a 54 year old female who returns for follow-up today.  She previously underwent left common iliac stenting August 2020 and subsequently required restenting September 2020 for an acute occlusion.  Since that time she has been on Brilinta aspirin and statin.  She continues to take all 3 of these.  She has no claudication symptoms.  Unfortunately she continues to smoke.  We discussed smoking cessation again today.  She did fall recently and has an abrasion on her left knee but this is healing.  Review of systems: She has no shortness of breath.  She has no chest pain.  Physical exam:  Vitals:   04/07/20 0838  BP: 115/74  Pulse: 69  Resp: 20  Temp: 98.1 F (36.7 C)  SpO2: 94%  Weight: 173 lb (78.5 kg)  Height: 5\' 6"  (1.676 m)   Skin healing abrasion left knee  Extremities: 2+ posterior tibial pulses bilaterally  Cardiac: Regular rate and rhythm  Data: Patient had a duplex ultrasound of her aorta iliac system today as well as bilateral ABIs.  I reviewed and interpreted both the studies.  ABIs were greater than 1 triphasic and normal bilaterally.  No significant restenosis in her left iliac stent.  Assessment: Doing well status post left common iliac stent no claudication symptoms currently.  Continued tobacco abuse.  Plan: Patient will continue to try to quit smoking.  She will follow up in our APP clinic in 1 year with repeat ABIs and an aortoiliac duplex.  Ruta Hinds, MD [facility] Office: [phone]

## 2020-04-12 DIAGNOSIS — Z9989 Dependence on other enabling machines and devices: Secondary | ICD-10-CM | POA: Diagnosis not present

## 2020-04-12 DIAGNOSIS — R42 Dizziness and giddiness: Secondary | ICD-10-CM | POA: Diagnosis not present

## 2020-04-12 DIAGNOSIS — G4733 Obstructive sleep apnea (adult) (pediatric): Secondary | ICD-10-CM | POA: Diagnosis not present

## 2020-04-12 DIAGNOSIS — R404 Transient alteration of awareness: Secondary | ICD-10-CM | POA: Diagnosis not present

## 2020-04-19 DIAGNOSIS — R42 Dizziness and giddiness: Secondary | ICD-10-CM | POA: Diagnosis not present

## 2020-04-19 DIAGNOSIS — R2689 Other abnormalities of gait and mobility: Secondary | ICD-10-CM | POA: Diagnosis not present

## 2020-04-20 ENCOUNTER — Encounter: Payer: Self-pay | Admitting: Adult Health

## 2020-04-20 ENCOUNTER — Other Ambulatory Visit: Payer: Self-pay

## 2020-04-20 ENCOUNTER — Ambulatory Visit (INDEPENDENT_AMBULATORY_CARE_PROVIDER_SITE_OTHER): Payer: Federal, State, Local not specified - PPO | Admitting: Adult Health

## 2020-04-20 DIAGNOSIS — F331 Major depressive disorder, recurrent, moderate: Secondary | ICD-10-CM

## 2020-04-20 DIAGNOSIS — F411 Generalized anxiety disorder: Secondary | ICD-10-CM | POA: Diagnosis not present

## 2020-04-20 DIAGNOSIS — F410 Panic disorder [episodic paroxysmal anxiety] without agoraphobia: Secondary | ICD-10-CM

## 2020-04-20 NOTE — Progress Notes (Signed)
ROWYN Wu [id number] [birth year] 54 y.o.  Subjective:   Patient ID:  Karen Wu is a 54 y.o. (DOB November 01, 1965) female.  Chief Complaint: No chief complaint on file.   HPI Karen Wu presents to the office today for follow-up of MDD, GAD, insomnia, panic attacks, Episodic mood disorder.   Describes mood today as "ok". Pleasant. Mood symptoms - reports depression. Anxious at times. Denies irritability. Denies panic attacks - feels "panicky" at times. Stating "I'm doing overall". Increased health issues. Trying to work every day, but struggles. Had to leave work with dizzy spells. Working with various providers to determine cause. Stable interest and motivation. She and family went to [facility] in April. Taking medications as prescribed.  Energy levels stable. Active, has a regular exercise routine. Gets 10,000 steps a day.  Enjoys some usual interests and activities. Married. Lives with husband of 83 years. Has 3 children - one at home - 29, 15, and 20. Has 4 grandchildren.  Appetite adequate. Weight loss 169 pounds.  Sleeps well most nights. Diagnosed with sleep apnea - using CPAP. Averages 6 to 8 hours.  Focus and concentration stable. Completing tasks. Managing some aspects of household. 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 Denies SI or HI. Denies AH or VH   Previous medications: Paxil   Review of Systems:  Review of Systems  Musculoskeletal: Negative for gait problem.  Neurological: Negative for tremors.  Psychiatric/Behavioral:       Please refer to HPI    Medications: I have reviewed the patient's current medications.  Current Outpatient Medications  Medication Sig Dispense Refill   Albuterol Sulfate (PROAIR RESPICLICK) 478 (90 Base) MCG/ACT AEPB Inhale 1-2 puffs into the lungs 3 (three) times daily as needed (wheezing/shortness of breath).      ALPRAZolam (XANAX) 0.25 MG tablet Take 1 tablet (0.25 mg total) by mouth daily. 30 tablet 2   amLODipine (NORVASC) 5 MG  tablet Take 5 mg by mouth daily.     aspirin EC 81 MG tablet Take 81 mg by mouth daily.      BRILINTA 90 MG TABS tablet TAKE ONE TABLET BY MOUTH TWICE DAILY 60 tablet 3   empagliflozin (JARDIANCE) 25 MG TABS tablet Take 25 mg by mouth daily.      famotidine (PEPCID) 40 MG tablet Take 40 mg by mouth at bedtime.      fluconazole (DIFLUCAN) 150 MG tablet Take 150 mg by mouth daily.     folic acid (FOLVITE) 1 MG tablet Take 3 mg by mouth at bedtime.      gabapentin (NEURONTIN) 300 MG capsule Take 300 mg by mouth 2 (two) times daily.      HUMIRA PEN 40 MG/0.4ML PNKT Inject 40 mg into the skin every 14 (fourteen) days. Fridays.     hydrochlorothiazide (HYDRODIURIL) 25 MG tablet Take 25 mg by mouth daily.     HYDROcodone-acetaminophen (NORCO/VICODIN) 5-325 MG tablet Take 1 tablet by mouth every 4 (four) hours as needed. 10 tablet 0   hydrocortisone (ANUSOL-HC) 25 MG suppository Place 25 mg rectally at bedtime.     irbesartan (AVAPRO) 150 MG tablet Take 150 mg by mouth daily.     LEVEMIR FLEXTOUCH 100 UNIT/ML Pen Inject 5-45 Units into the skin See admin instructions. Inject 5 units in the morning & 45 units at bedtime     metFORMIN (GLUCOPHAGE) 1000 MG tablet Take 1,000 mg by mouth 2 (two) times daily with a meal.     methotrexate  50 MG/2ML injection Inject 25 mg into the skin every Sunday.     omeprazole (PRILOSEC) 20 MG capsule Take 20 mg by mouth every morning.     oxybutynin (DITROPAN-XL) 10 MG 24 hr tablet Take 10 mg by mouth at bedtime.      PENNSAID 2 % SOLN Apply 1 application topically 2 (two) times daily as needed (hip pain.).     QUEtiapine (SEROQUEL) 50 MG tablet TAKE 2-3 TABLETS BY MOUTH DAILY AT BEDTIME 270 tablet 2   rosuvastatin (CRESTOR) 20 MG tablet Take 20 mg by mouth at bedtime.      TRULICITY 1.5 QM/5.7QI SOPN      Venlafaxine HCl 225 MG TB24 Take 225 mg by mouth at bedtime.     No current facility-administered medications for this visit.    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 of being hard to wake up post-op   GERD (gastroesophageal reflux disease)    OTC as needed   Graves' disease    no current med.   Heart murmur    as a child, states no problems   History of anemia    no problems since endometrial ablation   Hypertension    under control with meds., per pt.; has been on med. x 21 yr.   Insulin dependent diabetes mellitus    Trigger finger of right hand 10/2014   thumb and long finger    Family History  Problem Relation Age of Onset   Anesthesia problems Mother        hard to wake up post-op   Heart disease Mother    Kidney failure Mother    Stroke Mother    Heart attack Mother    Diabetes Mother    Anesthesia problems Father        woke up during surgery   Diabetes Father    High blood pressure Father    Kidney failure Maternal Grandmother    Heart disease Maternal Grandfather    Diabetes Maternal Grandfather    Stroke Paternal Grandmother     Social History   Socioeconomic History   Marital status: Married    Spouse name: Not on file   Number of children: Not on file   Years of education: Not on file   Highest education level: Not on file  Occupational History   Not on file  Tobacco Use   Smoking status: Former Smoker    Packs/day: 1.50    Years: 30.00    Pack years: 45.00    Types: Cigarettes    Quit date: 02/2019    Years since quitting: 1.1   Smokeless tobacco: Never Used  Vaping Use   Vaping Use: Never used  Substance and Sexual  Activity   Alcohol use: No   Drug use: No   Sexual activity: Not on file  Other Topics Concern   Not on file  Social History Narrative   Not on file   Social Determinants  of Health   Financial Resource Strain:    Difficulty of Paying Living Expenses: Not on file  Food Insecurity:    Worried About [place] in the Last Year: Not on file   Ran Out of Food in the Last Year: Not on file  Transportation Needs:    Lack of Transportation (Medical): Not on file   Lack of Transportation (Non-Medical): Not on file  Physical Activity:    Days of Exercise per Week: Not on file   Minutes of Exercise per Session: Not on file  Stress:    Feeling of Stress : Not on file  Social Connections:    Frequency of Communication with Friends and Family: Not on file   Frequency of Social Gatherings with Friends and Family: Not on file   Attends Religious Services: Not on file   Active Member of Clubs or Organizations: Not on file   Attends Archivist Meetings: Not on file   Marital Status: Not on file  Intimate Partner Violence:    Fear of Current or Ex-Partner: Not on file   Emotionally Abused: Not on file   Physically Abused: Not on file   Sexually Abused: Not on file    Past Medical History, Surgical history, Social history, and Family history were reviewed and updated as appropriate.   Please see review of systems for further details on the patient's review from today.   Objective:   Physical Exam:  LMP 09/30/2014 (Approximate)   Physical Exam Constitutional:      General: She is not in acute distress. Musculoskeletal:        General: No deformity.  Neurological:     Mental Status: She is alert and oriented to person, place, and time.     Coordination: Coordination normal.  Psychiatric:        Attention and Perception: Attention and perception normal. She does not perceive auditory or visual hallucinations.        Mood and Affect: Mood  normal. Mood is not anxious or depressed. Affect is not labile, blunt, angry or inappropriate.        Speech: Speech normal.        Behavior: Behavior normal.        Thought Content: Thought content normal. Thought content is not paranoid or delusional. Thought content does not include homicidal or suicidal ideation. Thought content does not include homicidal or suicidal plan.        Cognition and Memory: Cognition and memory normal.        Judgment: Judgment normal.     Comments: Insight intact     Lab Review:     Component Value Date/Time   NA 140 04/07/2019 0914   K 4.1 04/07/2019 0914   CL 102 04/07/2019 0914   CO2 20 (L) 03/16/2019 1757   GLUCOSE 168 (H) 04/07/2019 0914   BUN 14 04/07/2019 0914   CREATININE 0.50 04/07/2019 0914   CREATININE 0.61 09/03/2012 1008   CALCIUM 8.6 (L) 03/16/2019 1757   GFRNONAA >60 03/16/2019 1757   GFRNONAA >60 09/03/2012 1008   GFRAA >60 03/16/2019 1757   GFRAA >60 09/03/2012 1008       Component Value Date/Time   WBC 12.9 (H) 03/18/2019 1123   RBC 3.12 (L) 03/18/2019 1123   HGB 14.6 04/07/2019 0914   HCT 43.0 04/07/2019 0914   PLT 320 03/18/2019 1123   MCV 86.5 03/18/2019 1123   MCH 28.2 03/18/2019 1123   MCHC 32.6 03/18/2019 1123  RDW 16.2 (H) 03/18/2019 1123   LYMPHSABS 2.5 03/18/2019 0302   MONOABS 1.4 (H) 03/18/2019 0302   EOSABS 0.3 03/18/2019 0302   BASOSABS 0.1 03/18/2019 0302    No results found for: POCLITH, LITHIUM   No results found for: PHENYTOIN, PHENOBARB, VALPROATE, CBMZ   .res Assessment: Plan:    Plan:  1. Continue Effexor XR 225mg  every morning 2. Continue Xanax 0.25mg  ODT - prn panic attacks - has not used since January. 3. Continue Seroquel 150mg  at hs.  RTC 6 months  Patient advised to contact office with any questions, adverse effects, or acute worsening in signs and symptoms.  Discussed potential metabolic side effects associated with atypical antipsychotics, as well as potential risk for  movement side effects. Advised pt to contact office if movement side effects occur.   Discussed potential benefits, risk, and side effects of benzodiazepines to include potential risk of tolerance and dependence, as well as possible drowsiness.  Advised patient not to drive if experiencing drowsiness and to take lowest possible effective dose to minimize risk of dependence and tolerance.    Diagnoses and all orders for this visit:  Major depressive disorder, recurrent episode, moderate (HCC)  Episodic mood disorder (HCC)  Insomnia, unspecified type     Please see After Visit Summary for patient specific instructions.  Future Appointments  Date Time Provider [place]  10/19/2020  4:20 PM Olita Takeshita, Berdie Ogren, NP CP-CP None    No orders of the defined types were placed in this encounter.   -------------------------------

## 2020-04-21 DIAGNOSIS — R42 Dizziness and giddiness: Secondary | ICD-10-CM | POA: Diagnosis not present

## 2020-04-21 DIAGNOSIS — R2689 Other abnormalities of gait and mobility: Secondary | ICD-10-CM | POA: Diagnosis not present

## 2020-04-22 ENCOUNTER — Other Ambulatory Visit: Payer: Self-pay | Admitting: Orthopedic Surgery

## 2020-04-22 DIAGNOSIS — M25562 Pain in left knee: Secondary | ICD-10-CM

## 2020-04-25 ENCOUNTER — Other Ambulatory Visit: Payer: Self-pay | Admitting: Family Medicine

## 2020-04-25 DIAGNOSIS — Z1231 Encounter for screening mammogram for malignant neoplasm of breast: Secondary | ICD-10-CM

## 2020-04-26 DIAGNOSIS — Z23 Encounter for immunization: Secondary | ICD-10-CM | POA: Diagnosis not present

## 2020-04-26 DIAGNOSIS — M7062 Trochanteric bursitis, left hip: Secondary | ICD-10-CM | POA: Diagnosis not present

## 2020-04-26 DIAGNOSIS — M0579 Rheumatoid arthritis with rheumatoid factor of multiple sites without organ or systems involvement: Secondary | ICD-10-CM | POA: Diagnosis not present

## 2020-04-26 DIAGNOSIS — M2550 Pain in unspecified joint: Secondary | ICD-10-CM | POA: Diagnosis not present

## 2020-04-26 DIAGNOSIS — M79675 Pain in left toe(s): Secondary | ICD-10-CM | POA: Diagnosis not present

## 2020-05-03 DIAGNOSIS — G4733 Obstructive sleep apnea (adult) (pediatric): Secondary | ICD-10-CM | POA: Diagnosis not present

## 2020-05-05 ENCOUNTER — Other Ambulatory Visit: Payer: Self-pay | Admitting: Vascular Surgery

## 2020-05-13 ENCOUNTER — Other Ambulatory Visit: Payer: Self-pay

## 2020-05-13 ENCOUNTER — Ambulatory Visit
Admission: RE | Admit: 2020-05-13 | Discharge: 2020-05-13 | Disposition: A | Discharge status: Home / Self Care | Payer: Federal, State, Local not specified - PPO | Source: Ambulatory Visit | Attending: Orthopedic Surgery | Admitting: Orthopedic Surgery

## 2020-05-13 DIAGNOSIS — M25562 Pain in left knee: Secondary | ICD-10-CM | POA: Diagnosis not present

## 2020-05-18 DIAGNOSIS — R000 Tachycardia, unspecified: Secondary | ICD-10-CM | POA: Diagnosis not present

## 2020-05-18 DIAGNOSIS — I10 Essential (primary) hypertension: Secondary | ICD-10-CM | POA: Diagnosis not present

## 2020-05-18 DIAGNOSIS — T17920A Food in respiratory tract, part unspecified causing asphyxiation, initial encounter: Secondary | ICD-10-CM | POA: Diagnosis not present

## 2020-05-19 ENCOUNTER — Ambulatory Visit: Payer: Federal, State, Local not specified - PPO

## 2020-05-24 DIAGNOSIS — N3941 Urge incontinence: Secondary | ICD-10-CM | POA: Diagnosis not present

## 2020-06-03 DIAGNOSIS — G4733 Obstructive sleep apnea (adult) (pediatric): Secondary | ICD-10-CM | POA: Diagnosis not present

## 2020-06-16 DIAGNOSIS — F419 Anxiety disorder, unspecified: Secondary | ICD-10-CM | POA: Diagnosis not present

## 2020-06-16 DIAGNOSIS — K529 Noninfective gastroenteritis and colitis, unspecified: Secondary | ICD-10-CM | POA: Diagnosis not present

## 2020-06-18 DIAGNOSIS — K529 Noninfective gastroenteritis and colitis, unspecified: Secondary | ICD-10-CM | POA: Diagnosis not present

## 2020-07-03 DIAGNOSIS — G4733 Obstructive sleep apnea (adult) (pediatric): Secondary | ICD-10-CM | POA: Diagnosis not present

## 2020-07-06 DIAGNOSIS — R42 Dizziness and giddiness: Secondary | ICD-10-CM | POA: Diagnosis not present

## 2020-07-06 DIAGNOSIS — J329 Chronic sinusitis, unspecified: Secondary | ICD-10-CM | POA: Diagnosis not present

## 2020-07-27 DIAGNOSIS — M79675 Pain in left toe(s): Secondary | ICD-10-CM | POA: Diagnosis not present

## 2020-07-27 DIAGNOSIS — M0579 Rheumatoid arthritis with rheumatoid factor of multiple sites without organ or systems involvement: Secondary | ICD-10-CM | POA: Diagnosis not present

## 2020-07-27 DIAGNOSIS — M7062 Trochanteric bursitis, left hip: Secondary | ICD-10-CM | POA: Diagnosis not present

## 2020-07-27 DIAGNOSIS — M2550 Pain in unspecified joint: Secondary | ICD-10-CM | POA: Diagnosis not present

## 2020-07-27 DIAGNOSIS — E1165 Type 2 diabetes mellitus with hyperglycemia: Secondary | ICD-10-CM | POA: Diagnosis not present

## 2020-07-29 DIAGNOSIS — E1165 Type 2 diabetes mellitus with hyperglycemia: Secondary | ICD-10-CM | POA: Diagnosis not present

## 2020-07-29 DIAGNOSIS — Z794 Long term (current) use of insulin: Secondary | ICD-10-CM | POA: Diagnosis not present

## 2020-07-29 DIAGNOSIS — I10 Essential (primary) hypertension: Secondary | ICD-10-CM | POA: Diagnosis not present

## 2020-07-29 DIAGNOSIS — E7800 Pure hypercholesterolemia, unspecified: Secondary | ICD-10-CM | POA: Diagnosis not present

## 2020-08-02 DIAGNOSIS — M069 Rheumatoid arthritis, unspecified: Secondary | ICD-10-CM | POA: Diagnosis not present

## 2020-08-02 DIAGNOSIS — J329 Chronic sinusitis, unspecified: Secondary | ICD-10-CM | POA: Diagnosis not present

## 2020-08-02 DIAGNOSIS — J40 Bronchitis, not specified as acute or chronic: Secondary | ICD-10-CM | POA: Diagnosis not present

## 2020-08-03 DIAGNOSIS — G4733 Obstructive sleep apnea (adult) (pediatric): Secondary | ICD-10-CM | POA: Diagnosis not present

## 2020-08-10 DIAGNOSIS — G4733 Obstructive sleep apnea (adult) (pediatric): Secondary | ICD-10-CM | POA: Diagnosis not present

## 2020-08-15 DIAGNOSIS — M25521 Pain in right elbow: Secondary | ICD-10-CM | POA: Diagnosis not present

## 2020-08-15 DIAGNOSIS — I10 Essential (primary) hypertension: Secondary | ICD-10-CM | POA: Diagnosis not present

## 2020-08-15 DIAGNOSIS — W010XXA Fall on same level from slipping, tripping and stumbling without subsequent striking against object, initial encounter: Secondary | ICD-10-CM | POA: Diagnosis not present

## 2020-08-15 DIAGNOSIS — M069 Rheumatoid arthritis, unspecified: Secondary | ICD-10-CM | POA: Diagnosis not present

## 2020-08-15 DIAGNOSIS — I252 Old myocardial infarction: Secondary | ICD-10-CM | POA: Diagnosis not present

## 2020-08-15 DIAGNOSIS — E119 Type 2 diabetes mellitus without complications: Secondary | ICD-10-CM | POA: Diagnosis not present

## 2020-08-15 DIAGNOSIS — J449 Chronic obstructive pulmonary disease, unspecified: Secondary | ICD-10-CM | POA: Diagnosis not present

## 2020-08-15 DIAGNOSIS — S50311A Abrasion of right elbow, initial encounter: Secondary | ICD-10-CM | POA: Diagnosis not present

## 2020-08-15 DIAGNOSIS — F17210 Nicotine dependence, cigarettes, uncomplicated: Secondary | ICD-10-CM | POA: Diagnosis not present

## 2020-08-15 DIAGNOSIS — M7021 Olecranon bursitis, right elbow: Secondary | ICD-10-CM | POA: Diagnosis not present

## 2020-08-15 DIAGNOSIS — Z794 Long term (current) use of insulin: Secondary | ICD-10-CM | POA: Diagnosis not present

## 2020-08-31 DIAGNOSIS — I739 Peripheral vascular disease, unspecified: Secondary | ICD-10-CM | POA: Diagnosis not present

## 2020-08-31 DIAGNOSIS — E1165 Type 2 diabetes mellitus with hyperglycemia: Secondary | ICD-10-CM | POA: Diagnosis not present

## 2020-08-31 DIAGNOSIS — M05432 Rheumatoid myopathy with rheumatoid arthritis of left wrist: Secondary | ICD-10-CM | POA: Diagnosis not present

## 2020-08-31 DIAGNOSIS — I10 Essential (primary) hypertension: Secondary | ICD-10-CM | POA: Diagnosis not present

## 2020-08-31 DIAGNOSIS — E139 Other specified diabetes mellitus without complications: Secondary | ICD-10-CM | POA: Diagnosis not present

## 2020-09-07 ENCOUNTER — Other Ambulatory Visit: Payer: Self-pay | Admitting: Vascular Surgery

## 2020-09-07 DIAGNOSIS — Z6826 Body mass index (BMI) 26.0-26.9, adult: Secondary | ICD-10-CM | POA: Diagnosis not present

## 2020-09-07 DIAGNOSIS — Z794 Long term (current) use of insulin: Secondary | ICD-10-CM | POA: Diagnosis not present

## 2020-09-07 DIAGNOSIS — R112 Nausea with vomiting, unspecified: Secondary | ICD-10-CM | POA: Diagnosis not present

## 2020-09-07 DIAGNOSIS — E1165 Type 2 diabetes mellitus with hyperglycemia: Secondary | ICD-10-CM | POA: Diagnosis not present

## 2020-09-13 ENCOUNTER — Other Ambulatory Visit: Payer: Self-pay | Admitting: Adult Health

## 2020-09-13 DIAGNOSIS — F411 Generalized anxiety disorder: Secondary | ICD-10-CM

## 2020-09-13 DIAGNOSIS — F410 Panic disorder [episodic paroxysmal anxiety] without agoraphobia: Secondary | ICD-10-CM

## 2020-09-13 MED ORDER — ALPRAZOLAM 0.25 MG PO TABS: 0.2500 mg | ORAL_TABLET | Freq: Every day | ORAL | 2 refills | Status: DC

## 2020-09-13 NOTE — Addendum Note (Signed)
Addended by: Aloha Gell on: 09/13/2020 01:53 PM   Modules accepted: Orders

## 2020-09-15 DIAGNOSIS — I739 Peripheral vascular disease, unspecified: Secondary | ICD-10-CM | POA: Diagnosis not present

## 2020-09-15 DIAGNOSIS — E139 Other specified diabetes mellitus without complications: Secondary | ICD-10-CM | POA: Diagnosis not present

## 2020-09-15 DIAGNOSIS — Z8673 Personal history of transient ischemic attack (TIA), and cerebral infarction without residual deficits: Secondary | ICD-10-CM | POA: Diagnosis not present

## 2020-09-15 DIAGNOSIS — I10 Essential (primary) hypertension: Secondary | ICD-10-CM | POA: Diagnosis not present

## 2020-09-19 DIAGNOSIS — I471 Supraventricular tachycardia: Secondary | ICD-10-CM | POA: Diagnosis not present

## 2020-09-19 DIAGNOSIS — Z794 Long term (current) use of insulin: Secondary | ICD-10-CM | POA: Diagnosis not present

## 2020-09-19 DIAGNOSIS — I10 Essential (primary) hypertension: Secondary | ICD-10-CM | POA: Diagnosis not present

## 2020-09-19 DIAGNOSIS — E119 Type 2 diabetes mellitus without complications: Secondary | ICD-10-CM | POA: Diagnosis not present

## 2020-10-11 DIAGNOSIS — G4733 Obstructive sleep apnea (adult) (pediatric): Secondary | ICD-10-CM | POA: Diagnosis not present

## 2020-10-11 DIAGNOSIS — Z9989 Dependence on other enabling machines and devices: Secondary | ICD-10-CM | POA: Diagnosis not present

## 2020-10-19 ENCOUNTER — Ambulatory Visit: Payer: Federal, State, Local not specified - PPO | Admitting: Adult Health

## 2020-10-19 DIAGNOSIS — M0579 Rheumatoid arthritis with rheumatoid factor of multiple sites without organ or systems involvement: Secondary | ICD-10-CM | POA: Diagnosis not present

## 2020-10-19 DIAGNOSIS — M7062 Trochanteric bursitis, left hip: Secondary | ICD-10-CM | POA: Diagnosis not present

## 2020-10-19 DIAGNOSIS — M2550 Pain in unspecified joint: Secondary | ICD-10-CM | POA: Diagnosis not present

## 2020-10-19 DIAGNOSIS — M25422 Effusion, left elbow: Secondary | ICD-10-CM | POA: Diagnosis not present

## 2020-10-19 DIAGNOSIS — M79675 Pain in left toe(s): Secondary | ICD-10-CM | POA: Diagnosis not present

## 2020-10-26 DIAGNOSIS — R7989 Other specified abnormal findings of blood chemistry: Secondary | ICD-10-CM | POA: Diagnosis not present

## 2020-11-14 IMAGING — CT CT ABD-PELV W/O CM
2 of 4 series · 13 of 46 positions shown, 15 images · non-contrast
Comparison: Abdominal CTA 01/10/2011.
COMPARISON: Abdominal CTA 01/10/2011.

Addendum:
CLINICAL DATA: Abdominal pain post aortography and left common
iliac artery stenting today.

EXAM:
CT ABDOMEN AND PELVIS WITHOUT CONTRAST
TECHNIQUE: Multidetector CT imaging of the abdomen and pelvis was performed
following the standard protocol without IV contrast.

[Series 3: a/p w/o 5mm · axial · non-contrast · 0.88mm/px · z∈[+1076,+1491]mm · 10 of 99 slices shown, 12 images]
[im 8/99  soft-tissue]
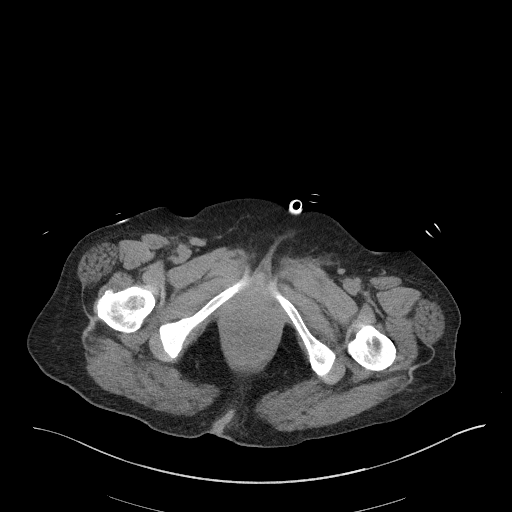
[im 8/99  bone]
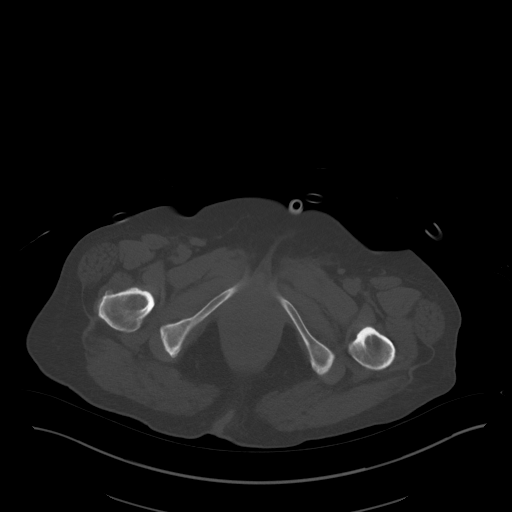
[im 16/99  soft-tissue]
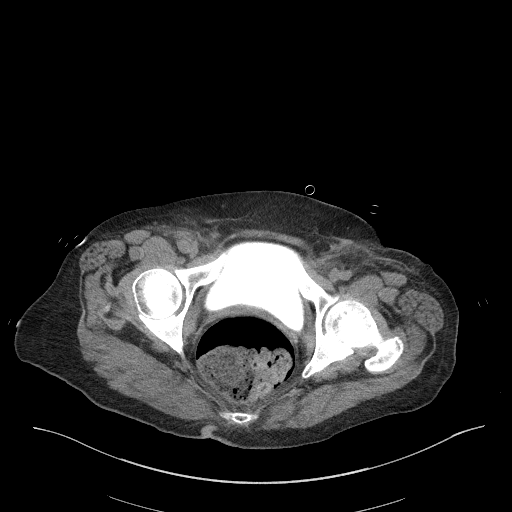
[im 27/99  soft-tissue]
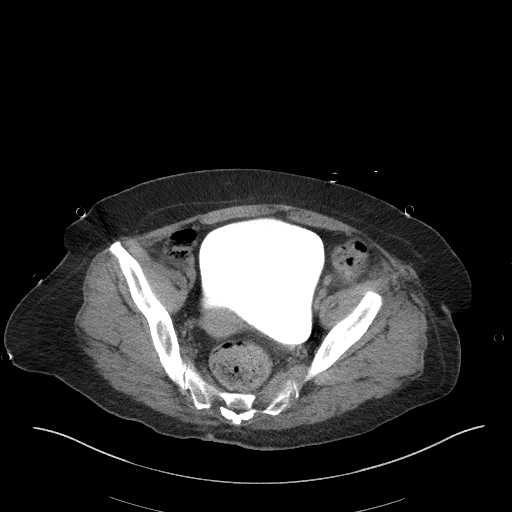
[im 34/99  soft-tissue]
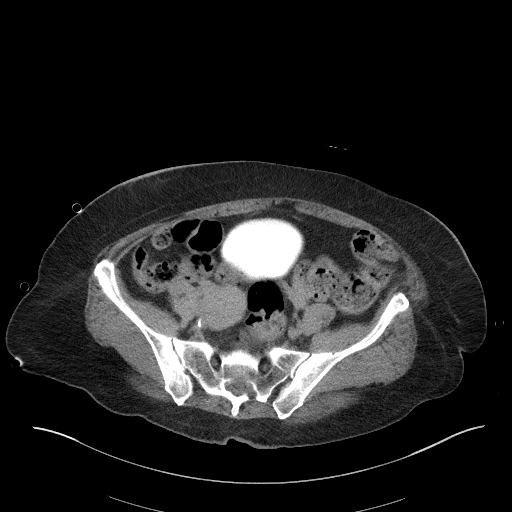
[im 46/99  soft-tissue]
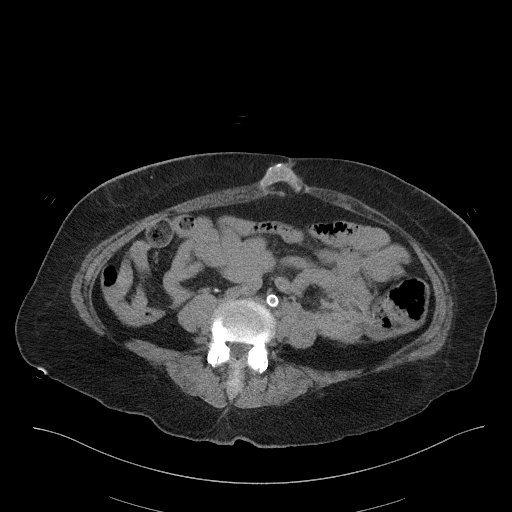
[im 53/99  soft-tissue]
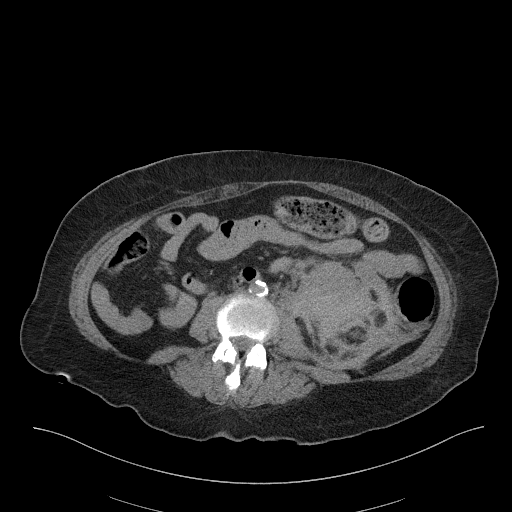
[im 65/99  soft-tissue]
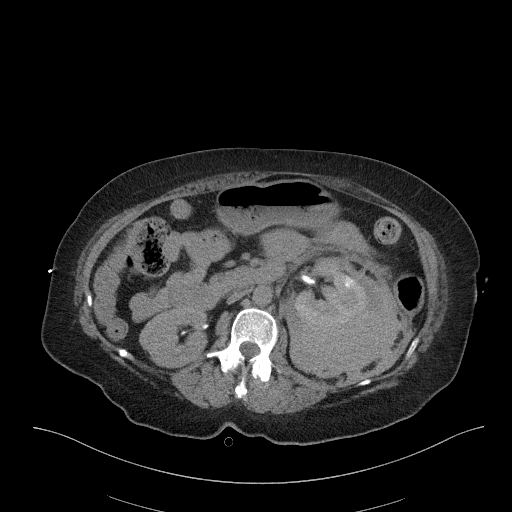
[im 72/99  soft-tissue]
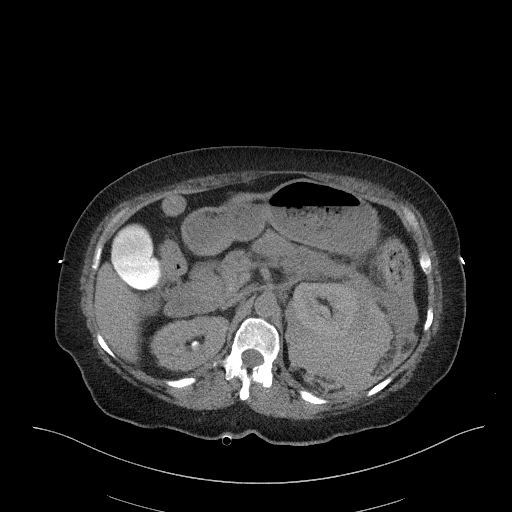
[im 83/99  soft-tissue]
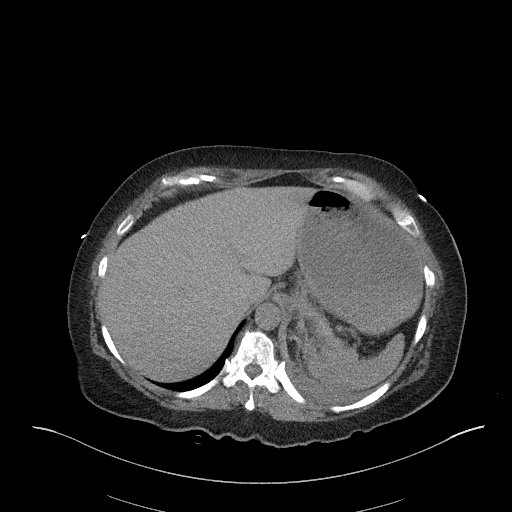
[im 83/99  bone]
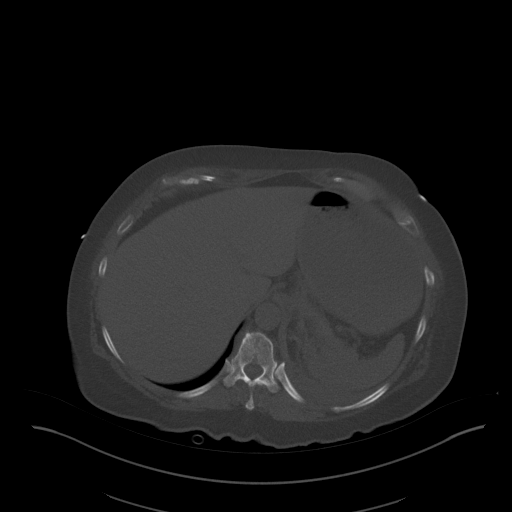
[im 91/99  soft-tissue]
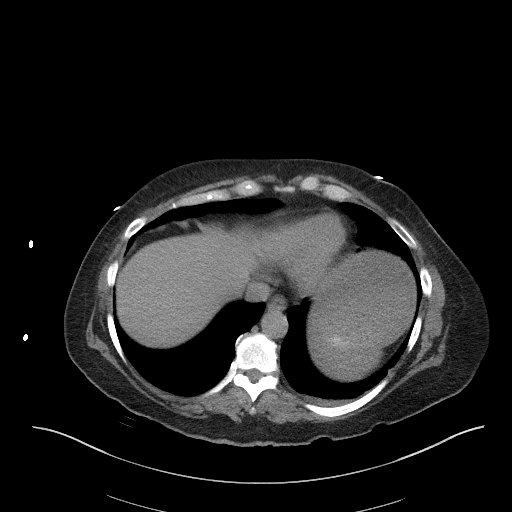

[Series 6: a/p w/o cor · coronal · non-contrast · 0.81mm/px · 3 of 149 slices shown]
[im 50/149  soft-tissue]
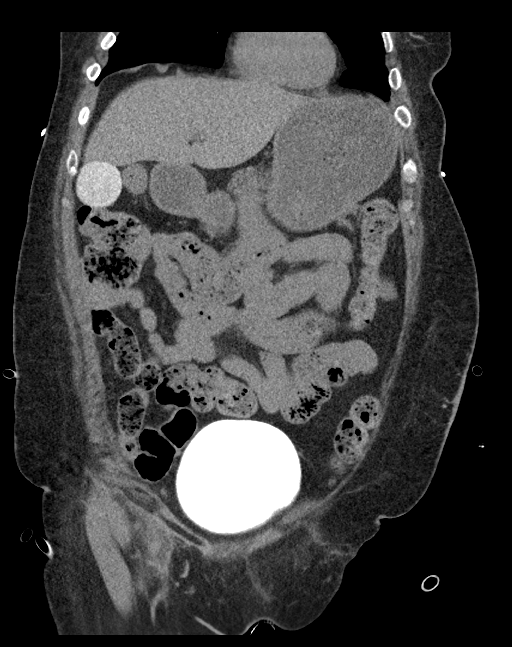
[im 66/149  soft-tissue]
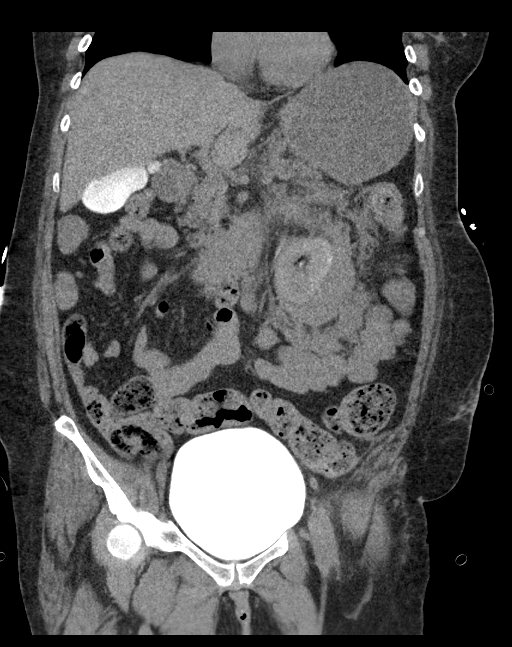
[im 83/149  soft-tissue]
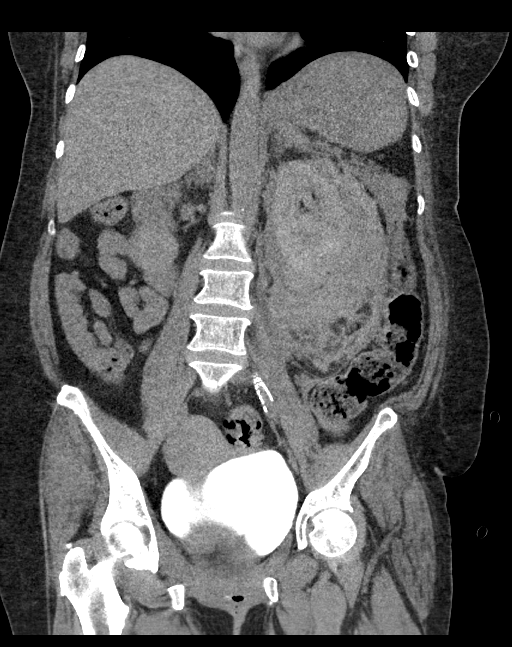

[13 of 46 positions shown; findings below may reference images not displayed]

FINDINGS: Lower chest: Trace left pleural effusion with mild left lower lobe
atelectasis. Underlying mild emphysema at both lung bases. The heart
size is normal. There is no pericardial effusion.

Hepatobiliary: The liver demonstrates no focal abnormality on
noncontrast imaging. There is contrast material within the
gallbladder consistent with vicarious excretion. No gallbladder wall
thickening or biliary dilatation.

Pancreas: Unremarkable. No pancreatic ductal dilatation or
surrounding inflammatory changes.

Spleen: Normal in size without focal abnormality.

Adrenals/Urinary Tract: The right adrenal gland and right kidney
appear normal. The left adrenal gland is not well visualized. There
is a large perinephric hematoma on the left which is predominately
subcapsular and measures up to 4.4 cm in thickness posteriorly. The
left kidney is anteriorly displaced. No evidence of collecting
system injury or hydronephrosis. The bladder appears normal.
Contrast material is present in the renal collecting systems and
bladder from the preceding procedure.

Stomach/Bowel: No evidence of bowel wall thickening, distention or
surrounding inflammatory change. The appendix appears normal. There
is moderate stool throughout the colon.

Vascular/Lymphatic: There are no enlarged abdominal or pelvic lymph
nodes. Moderate aortic and branch vessel atherosclerosis. Left
common iliac stent appears well positioned without surrounding
hemorrhage.

Reproductive: The uterus and ovaries appear normal. No adnexal mass.

Other: As above, large perinephric hematoma on the left with mild
superior extension around the pancreatic tail. There is no
significant pelvic hematoma. Mild soft tissue stranding is present
in both groins. There are apparent postsurgical changes at the
umbilicus suggesting previous hernia repair. Scattered injection
granulomas are present within the subcutaneous fat of the anterior
abdominal wall.

Musculoskeletal: No acute or significant osseous findings. Mild
facet hypertrophy in the lumbar spine.
IMPRESSION: 1. Large acute left perinephric hematoma with resulting anterior
displacement of the left kidney. No hydronephrosis.
2. Mild soft tissue stranding in both groins. No significant pelvic
hematoma.
3. The left common iliac stent appears well positioned without
surrounding hemorrhage.

ADDENDUM:
Critical Value/emergent results were called by telephone at the time
of interpretation on 03/16/2019 at [DATE] to Dr. YEHUDA NAPOLES ,
who verbally acknowledged these results.

*** End of Addendum ***
FINDINGS: Lower chest: Trace left pleural effusion with mild left lower lobe
atelectasis. Underlying mild emphysema at both lung bases. The heart
size is normal. There is no pericardial effusion.

Hepatobiliary: The liver demonstrates no focal abnormality on
noncontrast imaging. There is contrast material within the
gallbladder consistent with vicarious excretion. No gallbladder wall
thickening or biliary dilatation.

Pancreas: Unremarkable. No pancreatic ductal dilatation or
surrounding inflammatory changes.

Spleen: Normal in size without focal abnormality.

Adrenals/Urinary Tract: The right adrenal gland and right kidney
appear normal. The left adrenal gland is not well visualized. There
is a large perinephric hematoma on the left which is predominately
subcapsular and measures up to 4.4 cm in thickness posteriorly. The
left kidney is anteriorly displaced. No evidence of collecting
system injury or hydronephrosis. The bladder appears normal.
Contrast material is present in the renal collecting systems and
bladder from the preceding procedure.

Stomach/Bowel: No evidence of bowel wall thickening, distention or
surrounding inflammatory change. The appendix appears normal. There
is moderate stool throughout the colon.

Vascular/Lymphatic: There are no enlarged abdominal or pelvic lymph
nodes. Moderate aortic and branch vessel atherosclerosis. Left
common iliac stent appears well positioned without surrounding
hemorrhage.

Reproductive: The uterus and ovaries appear normal. No adnexal mass.

Other: As above, large perinephric hematoma on the left with mild
superior extension around the pancreatic tail. There is no
significant pelvic hematoma. Mild soft tissue stranding is present
in both groins. There are apparent postsurgical changes at the
umbilicus suggesting previous hernia repair. Scattered injection
granulomas are present within the subcutaneous fat of the anterior
abdominal wall.

Musculoskeletal: No acute or significant osseous findings. Mild
facet hypertrophy in the lumbar spine.
IMPRESSION: 1. Large acute left perinephric hematoma with resulting anterior
displacement of the left kidney. No hydronephrosis.
2. Mild soft tissue stranding in both groins. No significant pelvic
hematoma.
3. The left common iliac stent appears well positioned without
surrounding hemorrhage.

## 2020-11-22 DIAGNOSIS — E1165 Type 2 diabetes mellitus with hyperglycemia: Secondary | ICD-10-CM | POA: Diagnosis not present

## 2020-12-05 DIAGNOSIS — D420 Neoplasm of uncertain behavior of cerebral meninges: Secondary | ICD-10-CM | POA: Diagnosis not present

## 2020-12-05 DIAGNOSIS — D429 Neoplasm of uncertain behavior of meninges, unspecified: Secondary | ICD-10-CM | POA: Diagnosis not present

## 2020-12-15 ENCOUNTER — Other Ambulatory Visit: Payer: Self-pay | Admitting: Vascular Surgery

## 2020-12-16 DIAGNOSIS — G4733 Obstructive sleep apnea (adult) (pediatric): Secondary | ICD-10-CM | POA: Diagnosis not present

## 2020-12-17 ENCOUNTER — Other Ambulatory Visit: Payer: Self-pay

## 2020-12-17 DIAGNOSIS — I739 Peripheral vascular disease, unspecified: Secondary | ICD-10-CM

## 2020-12-17 DIAGNOSIS — I745 Embolism and thrombosis of iliac artery: Secondary | ICD-10-CM

## 2020-12-27 DIAGNOSIS — I10 Essential (primary) hypertension: Secondary | ICD-10-CM | POA: Diagnosis not present

## 2020-12-27 DIAGNOSIS — I739 Peripheral vascular disease, unspecified: Secondary | ICD-10-CM | POA: Diagnosis not present

## 2020-12-27 DIAGNOSIS — Z8673 Personal history of transient ischemic attack (TIA), and cerebral infarction without residual deficits: Secondary | ICD-10-CM | POA: Diagnosis not present

## 2020-12-27 DIAGNOSIS — E1165 Type 2 diabetes mellitus with hyperglycemia: Secondary | ICD-10-CM | POA: Diagnosis not present

## 2020-12-27 DIAGNOSIS — E139 Other specified diabetes mellitus without complications: Secondary | ICD-10-CM | POA: Diagnosis not present

## 2021-01-23 DIAGNOSIS — J329 Chronic sinusitis, unspecified: Secondary | ICD-10-CM | POA: Diagnosis not present

## 2021-01-23 DIAGNOSIS — Z20828 Contact with and (suspected) exposure to other viral communicable diseases: Secondary | ICD-10-CM | POA: Diagnosis not present

## 2021-01-23 DIAGNOSIS — J40 Bronchitis, not specified as acute or chronic: Secondary | ICD-10-CM | POA: Diagnosis not present

## 2021-01-30 DIAGNOSIS — J40 Bronchitis, not specified as acute or chronic: Secondary | ICD-10-CM | POA: Diagnosis not present

## 2021-01-30 DIAGNOSIS — J329 Chronic sinusitis, unspecified: Secondary | ICD-10-CM | POA: Diagnosis not present

## 2021-01-30 DIAGNOSIS — K1120 Sialoadenitis, unspecified: Secondary | ICD-10-CM | POA: Diagnosis not present

## 2021-01-30 DIAGNOSIS — R062 Wheezing: Secondary | ICD-10-CM | POA: Diagnosis not present

## 2021-02-01 DIAGNOSIS — I471 Supraventricular tachycardia: Secondary | ICD-10-CM | POA: Diagnosis not present

## 2021-02-01 DIAGNOSIS — I10 Essential (primary) hypertension: Secondary | ICD-10-CM | POA: Diagnosis not present

## 2021-02-01 DIAGNOSIS — I2510 Atherosclerotic heart disease of native coronary artery without angina pectoris: Secondary | ICD-10-CM | POA: Diagnosis not present

## 2021-02-01 DIAGNOSIS — I472 Ventricular tachycardia: Secondary | ICD-10-CM | POA: Diagnosis not present

## 2021-02-06 ENCOUNTER — Telehealth: Payer: Self-pay

## 2021-02-06 NOTE — Telephone Encounter (Signed)
Pt called with c/o LUE and LLE pain for about a week. She feels like a tight band is wrapped around the area. This pain comes and goes at times both when walking and resting. Pt's appt and studies have been moved up to next week. Pt is aware and verbalized understanding. She will call us back if anything changes/worsens.

## 2021-02-14 DIAGNOSIS — Z6828 Body mass index (BMI) 28.0-28.9, adult: Secondary | ICD-10-CM | POA: Diagnosis not present

## 2021-02-14 DIAGNOSIS — K1120 Sialoadenitis, unspecified: Secondary | ICD-10-CM | POA: Diagnosis not present

## 2021-02-16 ENCOUNTER — Other Ambulatory Visit: Payer: Self-pay

## 2021-02-16 ENCOUNTER — Ambulatory Visit (HOSPITAL_COMMUNITY)
Admission: RE | Admit: 2021-02-16 | Discharge: 2021-02-16 | Disposition: A | Discharge status: Home / Self Care | Payer: Federal, State, Local not specified - PPO | Source: Ambulatory Visit | Attending: Vascular Surgery | Admitting: Vascular Surgery

## 2021-02-16 ENCOUNTER — Ambulatory Visit: Payer: Federal, State, Local not specified - PPO | Admitting: Vascular Surgery

## 2021-02-16 ENCOUNTER — Encounter: Payer: Self-pay | Admitting: Vascular Surgery

## 2021-02-16 ENCOUNTER — Ambulatory Visit (INDEPENDENT_AMBULATORY_CARE_PROVIDER_SITE_OTHER)
Admission: RE | Admit: 2021-02-16 | Discharge: 2021-02-16 | Disposition: A | Discharge status: Home / Self Care | Payer: Federal, State, Local not specified - PPO | Source: Ambulatory Visit | Attending: Vascular Surgery | Admitting: Vascular Surgery

## 2021-02-16 VITALS — BP 133/77 | HR 68 | Temp 98.2°F | Resp 20 | Ht 66.0 in | Wt 169.0 lb

## 2021-02-16 DIAGNOSIS — I745 Embolism and thrombosis of iliac artery: Secondary | ICD-10-CM | POA: Diagnosis not present

## 2021-02-16 DIAGNOSIS — I739 Peripheral vascular disease, unspecified: Secondary | ICD-10-CM

## 2021-02-16 DIAGNOSIS — H9201 Otalgia, right ear: Secondary | ICD-10-CM | POA: Diagnosis not present

## 2021-02-16 DIAGNOSIS — J343 Hypertrophy of nasal turbinates: Secondary | ICD-10-CM | POA: Diagnosis not present

## 2021-02-16 DIAGNOSIS — F17210 Nicotine dependence, cigarettes, uncomplicated: Secondary | ICD-10-CM | POA: Diagnosis not present

## 2021-02-16 DIAGNOSIS — M26621 Arthralgia of right temporomandibular joint: Secondary | ICD-10-CM | POA: Diagnosis not present

## 2021-02-16 NOTE — Progress Notes (Signed)
Patient is a 55 year old female who returns for follow-up today.  She complains of pain in her left leg and left arm today.  She states she has a bandlike sensation almost like a blood pressure cuff around her upper arm and around her calf area all on the left side.  This occurs a few times a day.  It has been going on for about a week.  She does not really describe slurred speech or weakness.  It lasts a few minutes and then resolves.  It is not associated with activity and can occur whether she is sitting standing or any other activity.  She states she is compliant with her Brilinta aspirin and statin.  She has never had a stroke.  She does not really describe claudication.  She previously underwent left common iliac stenting by Dr. Donzetta Matters August 2020.  This subsequently occluded in September 2020 was recanalized and realigned by Dr. Trula Slade.  Since that time she has really had no difficulty.  This was done for an ulcer on the first toe.  Past Surgical History:  Procedure Laterality Date   ABDOMINAL AORTOGRAM W/LOWER EXTREMITY Bilateral 03/16/2019   Procedure: ABDOMINAL AORTOGRAM W/LOWER EXTREMITY;  Surgeon: Waynetta Sandy, MD;  Location: [facility];  Service: Cardiovascular;  Laterality: Bilateral;   BLADDER SUSPENSION  01/2014   BREAST MASS EXCISION Left 05/30/2005   ENDOMETRIAL ABLATION  01/2014   FOOT SURGERY Left    exc. cyst and bone fragments   LOWER EXTREMITY ANGIOGRAPHY Bilateral 04/07/2019   Procedure: LOWER EXTREMITY ANGIOGRAPHY;  Surgeon: Serafina Mitchell, MD;  Location: [facility];  Service: Cardiovascular;  Laterality: Bilateral;   MICROLARYNGOSCOPY WITH CO2 LASER AND EXCISION OF VOCAL CORD LESION  03/2014   PERIPHERAL VASCULAR INTERVENTION Left 03/16/2019   Procedure: PERIPHERAL VASCULAR INTERVENTION;  Surgeon: Waynetta Sandy, MD;  Location: [facility];  Service: Cardiovascular;  Laterality: Left;  Iliac   PERIPHERAL VASCULAR INTERVENTION Left  04/07/2019   Procedure: PERIPHERAL VASCULAR INTERVENTION;  Surgeon: Serafina Mitchell, MD;  Location: [facility];  Service: Cardiovascular;  Laterality: Left;  Common Iliac and Thrombectomy   SHOULDER ARTHROSCOPY WITH ROTATOR CUFF REPAIR Right    TONSILLECTOMY AND ADENOIDECTOMY     TRIGGER FINGER RELEASE Right 10/28/2014   Procedure: RIGHT THUMB AND LONG FINGER RELEASE TRIGGER ;  Surgeon: Leanora Cover, MD;  Location: [facility];  Service: Orthopedics;  Laterality: Right;   TUBAL LIGATION     UMBILICAL HERNIA REPAIR     Past Medical History:  Diagnosis Date   Arthritis    hips, knees, lower back   Asthma    rare inhaler use   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 of being hard to wake up post-op   GERD (gastroesophageal reflux disease)    OTC as needed   Graves' disease    no current med.   Heart murmur    as a child, states no problems   History of anemia    no problems since endometrial ablation   Hypertension    under control with meds., per pt.; has been on med. x 21 yr.   Insulin dependent diabetes mellitus    Trigger finger of right hand  10/2014   thumb and long finger    Review of systems:  Physical exam:  Vitals:   02/16/21 0846  BP: 133/77  Pulse: 68  Resp: 20  Temp: 98.2 F (36.8 C)  SpO2: 95%  Weight: 169 lb (76.7 kg)  Height: '5\' 6"'$  (1.676 m)   Neuro: Symmetric upper extremity lower extremity motor strength 5/5 no facial asymmetry  Extremities: 2+ brachial radial femoral posterior tibial pulses.  2+ right dorsalis pedis pulse.  Absent left dorsalis pedis pulse.  Skin: Toes left foot slightly dusky in appearance  Data: Patient had bilateral ABIs performed today which were greater than 1 normal and triphasic bilaterally.  She also had a duplex  of her left common iliac stent which showed no in-stent restenosis.  Assessment: 1.  Peripheral arterial disease patent iliac stent on the left side with normal triphasic bilateral ABIs.  She will need follow-up in 1 year with repeat duplex of her iliac stent and bilateral ABIs.  She was reassured today that her lower extremity arterial circulation is still in fairly good shape.  Smoking cessation was emphasized.  She states she has a quit date scheduled for August 12.  2.  Bandlike sensation left upper arm and left calf area.  I am not sure the exact etiology of this but we will obtain a carotid duplex scan to make sure that this is not a TIA type symptom.  I think most likely this is not a neurologic event but due to her history of peripheral arterial disease we will certainly rule this out.  She will have a carotid duplex scan to be seen in our APP clinic in 1 to 2 weeks.  Plan: Patient will follow up in our APP clinic in 1 year with repeat bilateral ABIs  Ruta Hinds, MD [facility] Office: [phone]

## 2021-02-17 ENCOUNTER — Other Ambulatory Visit: Payer: Self-pay

## 2021-02-17 DIAGNOSIS — E1165 Type 2 diabetes mellitus with hyperglycemia: Secondary | ICD-10-CM | POA: Insufficient documentation

## 2021-02-17 DIAGNOSIS — M05432 Rheumatoid myopathy with rheumatoid arthritis of left wrist: Secondary | ICD-10-CM | POA: Insufficient documentation

## 2021-02-17 DIAGNOSIS — I6529 Occlusion and stenosis of unspecified carotid artery: Secondary | ICD-10-CM

## 2021-02-19 NOTE — Progress Notes (Signed)
HISTORY AND PHYSICAL     CC:  follow up. Requesting Provider:  Angelina Sheriff, MD  HPI: This is a 55 y.o. female who has hx of left common iliac stenting by Dr. Donzetta Matters August 2020.  This subsequently occluded in September 2020 was recanalized and realigned by Dr. Trula Slade.   Pt returns today for follow up.  She was seen by Dr. Oneida Alar on 02/16/2021 and at that time, she was doing well from her lower extremity PAD.  She did have complaints of  a bandlike sensation almost like a blood pressure cuff around her upper arm and around her calf area all on the left side.  It was happening a few times a day for about a week.  She did not have any slurred speech or weakness.  It would last a few minutes and then resolve.  She has been compliant with her Brilinta and asa and statin.  She has never had a stroke.  She did not really have any claudication.  She comes in today for carotid duplex to check for carotid artery stenosis.    Pt denies any amaurosis fugax, speech difficulties, weakness, numbness, paralysis or clumsiness or facial droop.   She continues to have the sensation on the left arm and leg of a blood pressure cuff and this has been ongoing for a couple of weeks.    She tells me that she has a stop date of 02/24/2021 to quit smoking.  She states that her mother had PAD with hx of digit amputation and she does not want to go down that path.  She wants to be here for her grandchildren.    The pt is on a statin for cholesterol management.  The pt is on a daily aspirin.   Other AC:  Brilinta The pt is on CCB, ARB, HCTZ for hypertension.   The pt is diabetic.   Tobacco hx:  current  Pt does not have family hx of AAA.  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 of being hard to wake up post-op   GERD (gastroesophageal reflux disease)    OTC as needed   Graves' disease    no current med.   Heart murmur    as a child, states no problems   History of anemia    no problems since endometrial ablation   Hypertension    under control with meds., per pt.; has been on med. x 21 yr.   Insulin dependent diabetes mellitus    Trigger finger of right hand 10/2014   thumb and long finger    Past Surgical History:  Procedure Laterality Date   ABDOMINAL AORTOGRAM W/LOWER EXTREMITY Bilateral 03/16/2019   Procedure: ABDOMINAL AORTOGRAM W/LOWER EXTREMITY;  Surgeon: Waynetta Sandy, MD;  Location: [facility];  Service: Cardiovascular;  Laterality: Bilateral;   BLADDER SUSPENSION  01/2014   BREAST MASS EXCISION Left 05/30/2005   ENDOMETRIAL ABLATION  01/2014   FOOT SURGERY Left    exc. cyst and bone fragments   LOWER EXTREMITY ANGIOGRAPHY Bilateral 04/07/2019   Procedure: LOWER EXTREMITY  ANGIOGRAPHY;  Surgeon: Serafina Mitchell, MD;  Location: [facility];  Service: Cardiovascular;  Laterality: Bilateral;   MICROLARYNGOSCOPY WITH CO2 LASER AND EXCISION OF VOCAL CORD LESION  03/2014   PERIPHERAL VASCULAR INTERVENTION Left 03/16/2019   Procedure: PERIPHERAL VASCULAR INTERVENTION;  Surgeon: Waynetta Sandy, MD;  Location: [facility];  Service: Cardiovascular;  Laterality: Left;  Iliac   PERIPHERAL VASCULAR INTERVENTION Left 04/07/2019   Procedure: PERIPHERAL VASCULAR INTERVENTION;  Surgeon: Serafina Mitchell, MD;  Location: [facility];  Service: Cardiovascular;  Laterality: Left;  Common Iliac and Thrombectomy   SHOULDER ARTHROSCOPY WITH ROTATOR CUFF REPAIR Right    TONSILLECTOMY AND ADENOIDECTOMY     TRIGGER FINGER RELEASE Right 10/28/2014   Procedure: RIGHT THUMB AND LONG FINGER RELEASE TRIGGER ;  Surgeon: Leanora Cover, MD;  Location: [facility];  Service: Orthopedics;  Laterality: Right;   TUBAL LIGATION     UMBILICAL HERNIA REPAIR      Allergies  Allergen Reactions   Metoprolol Tartrate Other (See Comments)    Current Outpatient Medications  Medication Sig Dispense Refill   Albuterol Sulfate (PROAIR RESPICLICK) 123XX123 (90 Base) MCG/ACT AEPB Inhale 1-2 puffs into the lungs 3 (three) times daily as needed (wheezing/shortness of breath).      ALPRAZolam (XANAX) 0.25 MG tablet Take 1 tablet (0.25 mg total) by mouth daily. 30 tablet 2   amLODipine (NORVASC) 5 MG tablet Take 5 mg by mouth daily.     aspirin EC 81 MG tablet Take 81 mg by mouth daily.      BRILINTA 90 MG TABS tablet TAKE 1 TABLET BY MOUTH TWICE A DAY 60 tablet 3   empagliflozin (JARDIANCE) 25 MG TABS tablet Take 25 mg by mouth daily.      famotidine (PEPCID) 40 MG tablet Take 40 mg by mouth at bedtime.      fluconazole (DIFLUCAN) 150 MG tablet Take 150 mg by mouth daily.     folic acid (FOLVITE) 1 MG tablet Take 3 mg by mouth at bedtime.      gabapentin (NEURONTIN) 300 MG capsule Take 300 mg by mouth 2 (two) times daily.      HUMIRA PEN 40 MG/0.4ML PNKT Inject 40 mg into the skin every 14 (fourteen) days. Fridays.     hydrochlorothiazide (HYDRODIURIL) 25 MG tablet Take 25 mg by mouth daily.     HYDROcodone-acetaminophen (NORCO/VICODIN) 5-325 MG tablet Take 1 tablet by mouth every 4 (four) hours as needed. 10 tablet 0   hydrocortisone (ANUSOL-HC) 25 MG suppository Place 25 mg rectally at bedtime.     irbesartan (AVAPRO) 150 MG tablet Take 150 mg by mouth daily.     LEVEMIR FLEXTOUCH 100 UNIT/ML Pen Inject 5-45 Units into the skin See admin instructions. Inject 5 units in the morning & 45 units at bedtime     metFORMIN (GLUCOPHAGE) 1000 MG tablet Take 1,000 mg by mouth 2 (two) times daily with a meal.     methotrexate 50 MG/2ML injection Inject 25 mg into the skin every Sunday.     omeprazole (PRILOSEC) 20 MG capsule Take 20 mg by mouth every morning.      oxybutynin (DITROPAN-XL) 10 MG 24 hr tablet Take 10 mg by mouth at bedtime.      PENNSAID 2 % SOLN Apply 1 application topically 2 (two) times daily as needed (hip pain.).     QUEtiapine (SEROQUEL) 50 MG tablet TAKE 2-3 TABLETS BY MOUTH DAILY AT BEDTIME 270  tablet 2   rosuvastatin (CRESTOR) 20 MG tablet Take 20 mg by mouth at bedtime.      TRULICITY 1.5 0000000 SOPN      Venlafaxine HCl 225 MG TB24 Take 225 mg by mouth at bedtime.     No current facility-administered medications for this visit.    Family History  Problem Relation Age of Onset   Anesthesia problems Mother        hard to wake up post-op   Heart disease Mother    Kidney failure Mother    Stroke Mother    Heart attack Mother    Diabetes Mother    Anesthesia problems Father        woke up during surgery   Diabetes Father    High blood pressure Father    Kidney failure Maternal Grandmother    Heart disease Maternal Grandfather    Diabetes Maternal Grandfather    Stroke Paternal Grandmother     Social History   Socioeconomic History   Marital status: Married    Spouse name: Not on file   Number of children: Not on file   Years of education: Not on file   Highest education level: Not on file  Occupational History   Not on file  Tobacco Use   Smoking status: Former    Packs/day: 1.50    Years: 30.00    Pack years: 45.00    Types: Cigarettes    Quit date: 02/2019    Years since quitting: 2.0   Smokeless tobacco: Never  Vaping Use   Vaping Use: Never used  Substance and Sexual Activity   Alcohol use: No   Drug use: No   Sexual activity: Not on file  Other Topics Concern   Not on file  Social History Narrative   Not on file   Social Determinants of Health   Financial Resource Strain: Not on file  Food Insecurity: Not on file  Transportation Needs: Not on file  Physical Activity: Not on file  Stress: Not on file  Social Connections: Not on file  Intimate Partner Violence: Not on file      REVIEW OF SYSTEMS:   '[X]'$  denotes positive finding, '[ ]'$  denotes negative finding Cardiac  Comments:  Chest pain or chest pressure:    Shortness of breath upon exertion:    Short of breath when lying flat:    Irregular heart rhythm:        Vascular    Pain in calf, thigh, or hip brought on by ambulation:    Pain in feet at night that wakes you up from your sleep:     Blood clot in your veins:    Leg swelling:         Pulmonary    Oxygen at home:    Productive cough:     Wheezing:         Neurologic    Sudden weakness in arms or legs:  x See HPI  Sudden numbness in arms or legs:     Sudden onset of difficulty speaking or slurred speech:    Temporary loss of vision in one eye:     Problems with dizziness:         Gastrointestinal    Blood in stool:     Vomited blood:         Genitourinary    Burning when urinating:     Blood in urine:        Psychiatric  Major depression:         Hematologic    Bleeding problems:    Problems with blood clotting too easily:        Skin    Rashes or ulcers:        Constitutional    Fever or chills:      PHYSICAL EXAMINATION:  Today's Vitals   02/20/21 0850 02/20/21 0852  BP: 133/82 133/81  Pulse: 73 73  Resp: 14   Temp: (!) 97.4 F (36.3 C)   TempSrc: Temporal   SpO2: 98%   Weight: 169 lb (76.7 kg)   Height: '5\' 5"'$  (1.651 m)    Body mass index is 28.12 kg/m.   General:  WDWN in NAD; vital signs documented above Gait: Not observed HENT: WNL, normocephalic Pulmonary: normal non-labored breathing Cardiac: regular HR, without carotid bruits Lungs:  non labored Skin: without rashes Vascular Exam/Pulses: Pedal pulses are not palpable Extremities: without ischemic changes, without Gangrene , without cellulitis; without open wounds Musculoskeletal: no muscle wasting or atrophy  Neurologic: A&O X 3; moving all extremities equally; speech is fluent/normal Psychiatric:  The pt has Normal affect.   Non-Invasive  Vascular Imaging:   Carotid Duplex on 02/20/2021: Right:  1-39% ICA stenosis Left:  1-39% ICA stenosis Vertebrals:  Bilateral vertebral arteries demonstrate antegrade flow.  Subclavians: Normal flow hemodynamics were seen in bilateral subclavian arteries.   Previous Carotid duplex on 10/09/2019: Right: 1-39% ICA stenosis Left:   1-39% ICA stenosis  Previous iliac artery duplex 02/16/2021: No in stent stenosis; triphasic  Previous ABI 02/16/2021: Right:  1.24/0.72 Left:  1.24/near absent   ASSESSMENT/PLAN:: 55 y.o. female here for carotid duplex for left sided tightness of arm and leg that has been ongoing for a couple of weeks.  -duplex today reveals 1-39% bilateral ICA stenosis.  Her subclavian waveforms were also normal with equal blood pressures bilaterally.  I have discussed with pt to follow up with Dr. Lovette Cliche her PCP to further investigate cause of this.  Do not feel her carotids are a source of this issue. -pt will f/u in one year with ABI.  She knows to call sooner if she develops rest pain or non healing wounds. -continue statin/asa/Brilinta -she has a quit date of 02/24/2021 to quit smoking.  Discussed importance of this and praised her for wanting to quit.     Leontine Locket, [facility] [phone]  Clinic MD:  Trula Slade

## 2021-02-20 ENCOUNTER — Ambulatory Visit (HOSPITAL_COMMUNITY)
Admission: RE | Admit: 2021-02-20 | Discharge: 2021-02-20 | Disposition: A | Discharge status: Home / Self Care | Payer: Federal, State, Local not specified - PPO | Source: Ambulatory Visit | Attending: Surgery | Admitting: Surgery

## 2021-02-20 ENCOUNTER — Ambulatory Visit (INDEPENDENT_AMBULATORY_CARE_PROVIDER_SITE_OTHER): Payer: Federal, State, Local not specified - PPO | Admitting: Physician Assistant

## 2021-02-20 ENCOUNTER — Other Ambulatory Visit: Payer: Self-pay

## 2021-02-20 VITALS — BP 133/81 | HR 73 | Temp 97.4°F | Resp 14 | Ht 65.0 in | Wt 169.0 lb

## 2021-02-20 DIAGNOSIS — I6529 Occlusion and stenosis of unspecified carotid artery: Secondary | ICD-10-CM

## 2021-04-03 DIAGNOSIS — K080 Exfoliation of teeth due to systemic causes: Secondary | ICD-10-CM | POA: Diagnosis not present

## 2021-04-10 ENCOUNTER — Encounter (HOSPITAL_COMMUNITY): Payer: Federal, State, Local not specified - PPO

## 2021-04-10 ENCOUNTER — Ambulatory Visit: Payer: Federal, State, Local not specified - PPO

## 2021-04-27 DIAGNOSIS — I10 Essential (primary) hypertension: Secondary | ICD-10-CM | POA: Diagnosis not present

## 2021-04-27 DIAGNOSIS — E139 Other specified diabetes mellitus without complications: Secondary | ICD-10-CM | POA: Diagnosis not present

## 2021-04-27 DIAGNOSIS — I739 Peripheral vascular disease, unspecified: Secondary | ICD-10-CM | POA: Diagnosis not present

## 2021-04-27 DIAGNOSIS — Z8673 Personal history of transient ischemic attack (TIA), and cerebral infarction without residual deficits: Secondary | ICD-10-CM | POA: Diagnosis not present

## 2021-04-28 DIAGNOSIS — G4733 Obstructive sleep apnea (adult) (pediatric): Secondary | ICD-10-CM | POA: Diagnosis not present

## 2021-05-06 ENCOUNTER — Other Ambulatory Visit: Payer: Self-pay | Admitting: Vascular Surgery

## 2021-05-29 DIAGNOSIS — G4733 Obstructive sleep apnea (adult) (pediatric): Secondary | ICD-10-CM | POA: Diagnosis not present

## 2021-06-27 DIAGNOSIS — M25512 Pain in left shoulder: Secondary | ICD-10-CM | POA: Diagnosis not present

## 2021-06-28 DIAGNOSIS — G4733 Obstructive sleep apnea (adult) (pediatric): Secondary | ICD-10-CM | POA: Diagnosis not present

## 2021-08-30 DIAGNOSIS — R002 Palpitations: Secondary | ICD-10-CM | POA: Diagnosis not present

## 2021-08-30 DIAGNOSIS — I2510 Atherosclerotic heart disease of native coronary artery without angina pectoris: Secondary | ICD-10-CM | POA: Diagnosis not present

## 2021-08-30 DIAGNOSIS — I4729 Other ventricular tachycardia: Secondary | ICD-10-CM | POA: Diagnosis not present

## 2021-08-30 DIAGNOSIS — I739 Peripheral vascular disease, unspecified: Secondary | ICD-10-CM | POA: Diagnosis not present

## 2021-08-30 DIAGNOSIS — I10 Essential (primary) hypertension: Secondary | ICD-10-CM | POA: Diagnosis not present

## 2021-09-17 ENCOUNTER — Other Ambulatory Visit: Payer: Self-pay | Admitting: Vascular Surgery

## 2021-10-16 DIAGNOSIS — Z6828 Body mass index (BMI) 28.0-28.9, adult: Secondary | ICD-10-CM | POA: Diagnosis not present

## 2021-10-16 DIAGNOSIS — E7800 Pure hypercholesterolemia, unspecified: Secondary | ICD-10-CM | POA: Diagnosis not present

## 2021-10-16 DIAGNOSIS — Z1331 Encounter for screening for depression: Secondary | ICD-10-CM | POA: Diagnosis not present

## 2021-10-16 DIAGNOSIS — I10 Essential (primary) hypertension: Secondary | ICD-10-CM | POA: Diagnosis not present

## 2022-01-12 IMAGING — MR MR KNEE*L* W/O CM
4 of 6 series · 25 of 40 positions shown · non-contrast
Comparison: None.

CLINICAL DATA: Left knee pain.

EXAM:
MRI OF THE LEFT KNEE WITHOUT CONTRAST
TECHNIQUE: Multiplanar, multisequence MR imaging of the knee was performed. No
intravenous contrast was administered.

[Series 3: T2 fat-sat · axial · 4.0mm · 0.50mm/px · z∈[-49,+66]mm · 6 of 24 slices shown (1 of 2)]
[im 1/24]
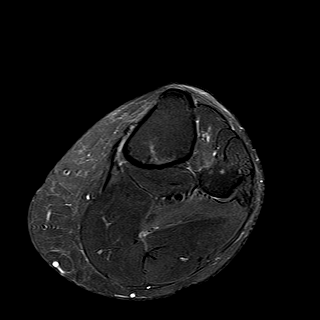
[im 5/24]
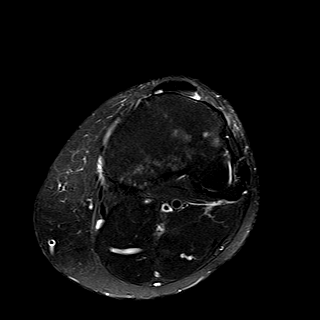
[im 10/24]
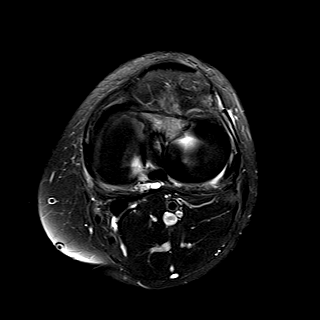
[im 14/24]
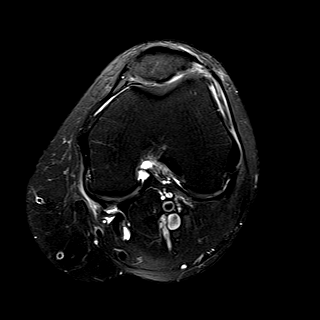
[im 19/24]
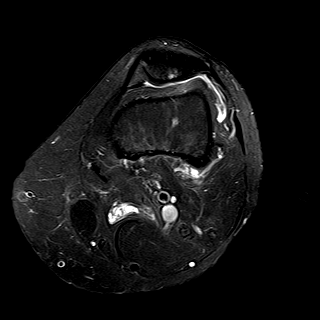
[im 24/24]
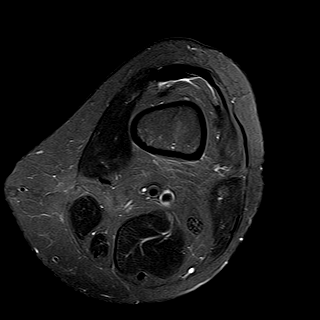

[Series 5: T2 fat-sat · coronal · 4.0mm · 0.29mm/px · 5 of 22 slices shown (2 of 2)]
[im 1/22]
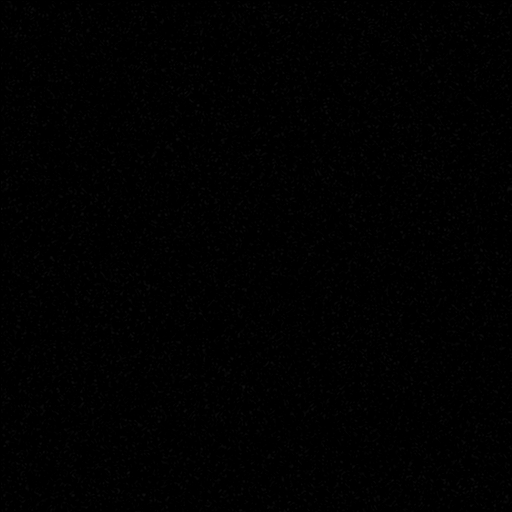
[im 5/22]
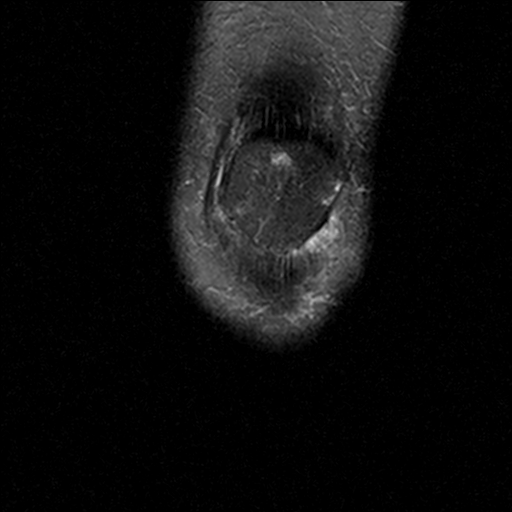
[im 9/22]
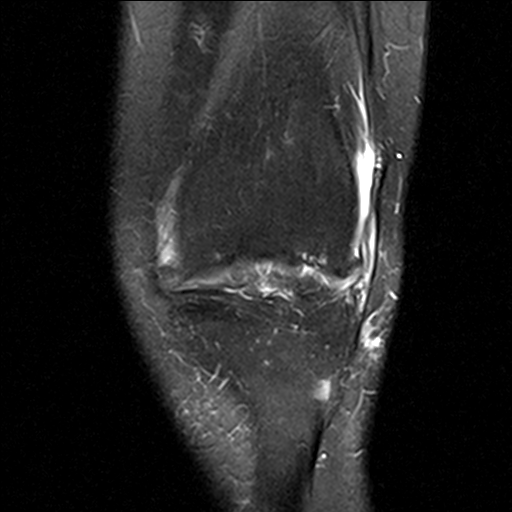
[im 13/22]
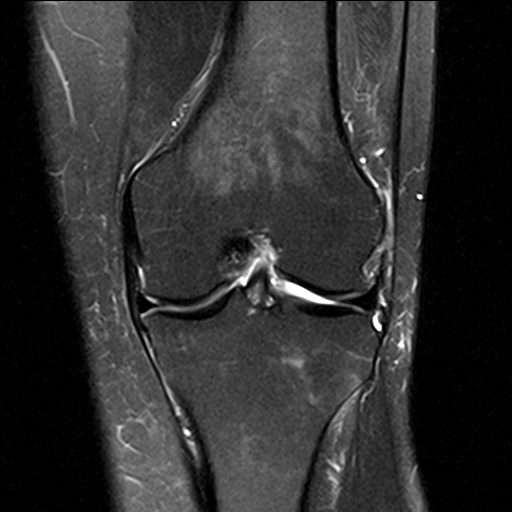
[im 22/22]
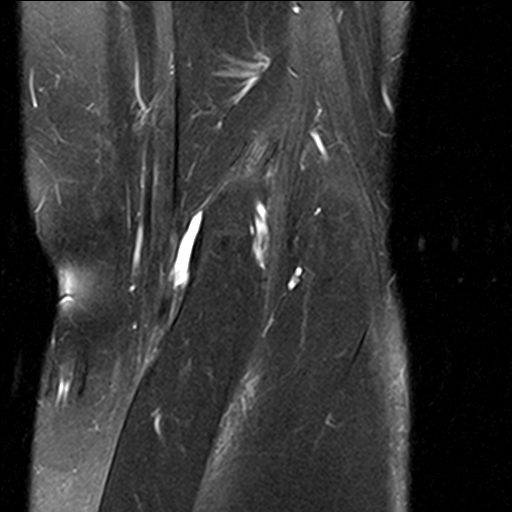

[Series 7: PD fat-sat · sagittal · 3.0mm · 0.29mm/px · 8 of 27 slices shown (1 of 2)]
[im 1/27]
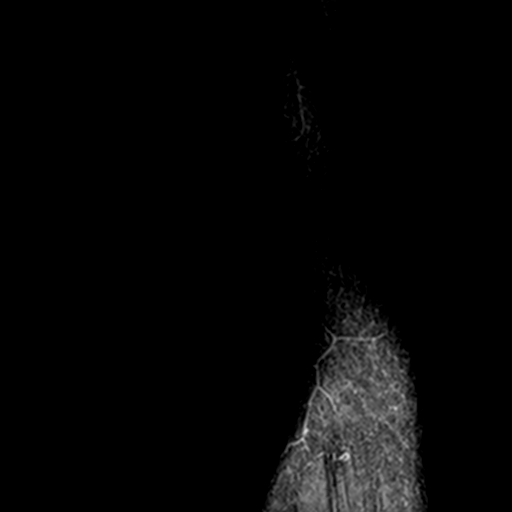
[im 4/27]
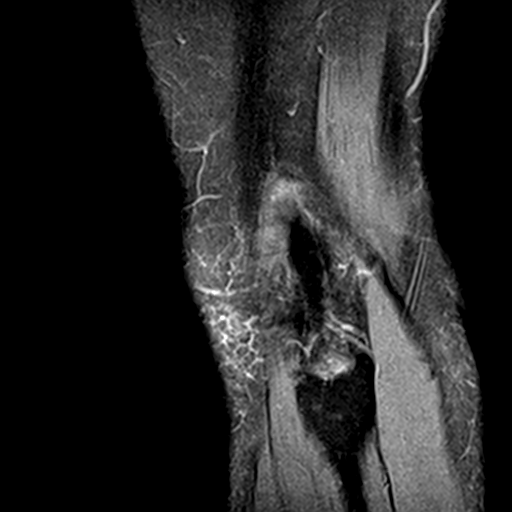
[im 8/27]
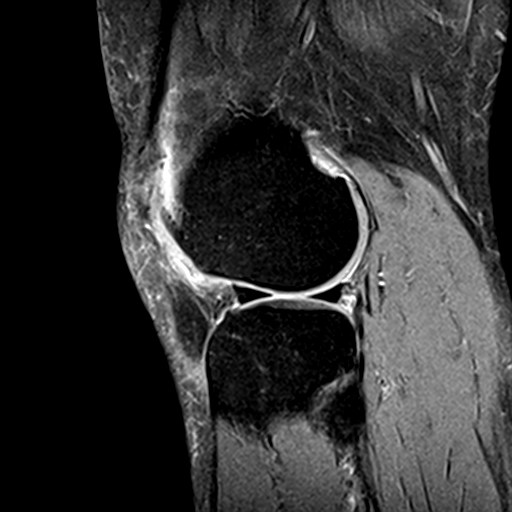
[im 12/27]
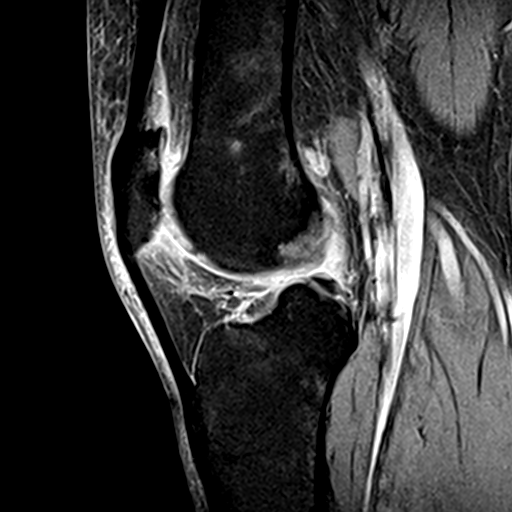
[im 15/27]
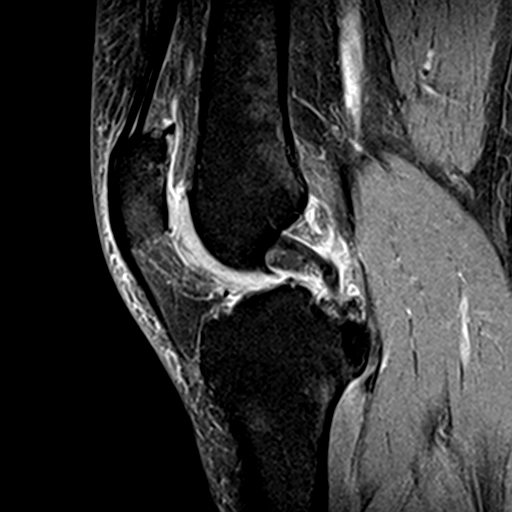
[im 19/27]
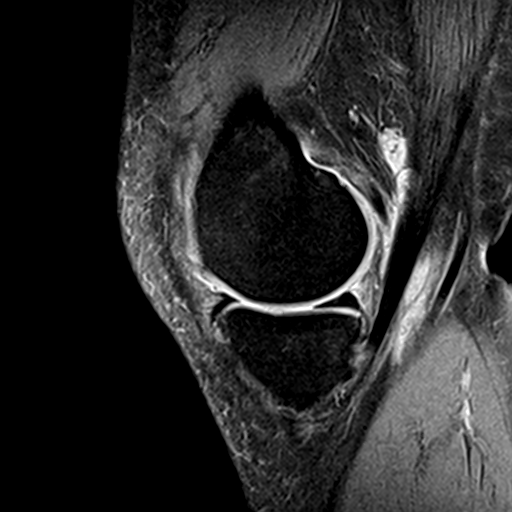
[im 23/27]
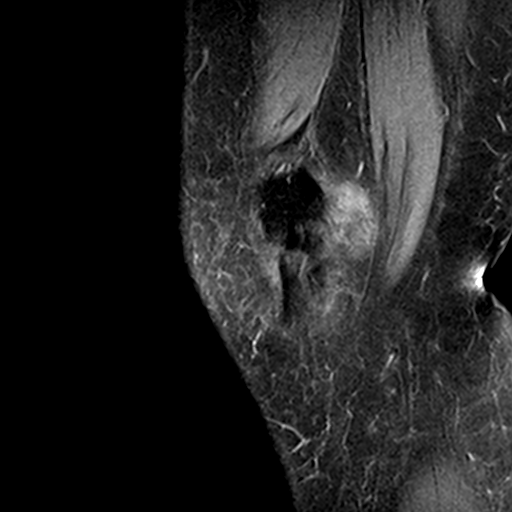
[im 27/27]
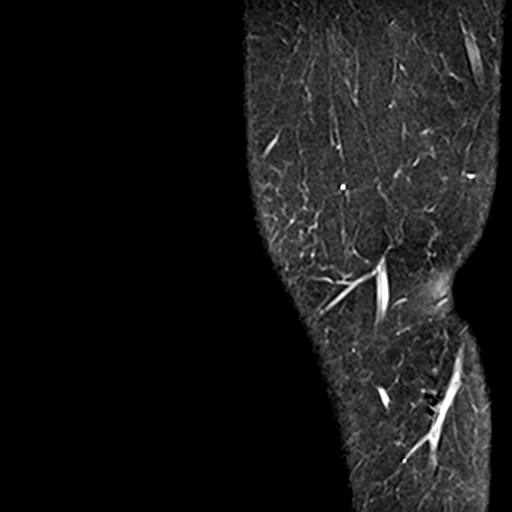

[Series 8: PD fat-sat · coronal · 4.0mm · 0.39mm/px · 6 of 22 slices shown (2 of 2)]
[im 1/22]
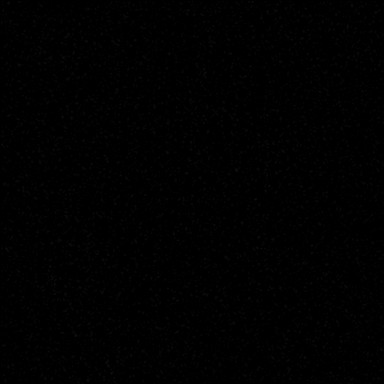
[im 5/22]
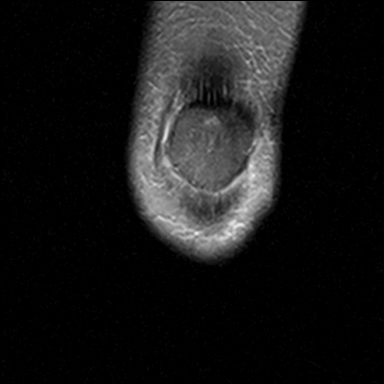
[im 9/22]
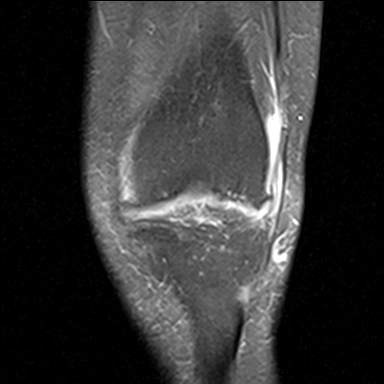
[im 13/22]
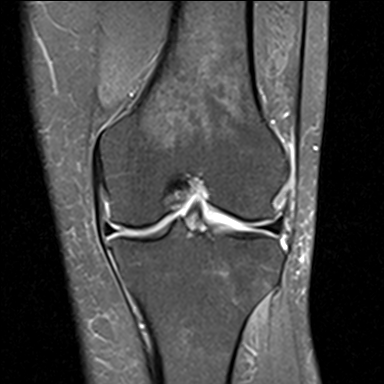
[im 17/22]
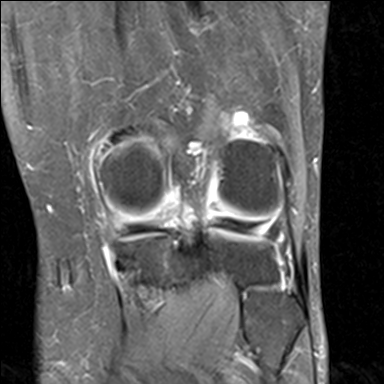
[im 22/22]
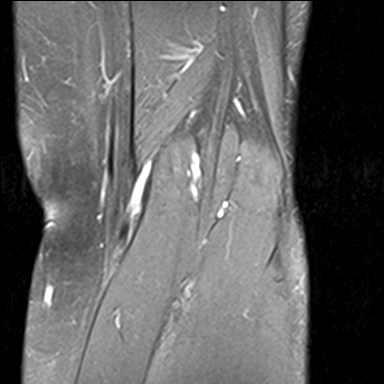

[25 of 40 positions shown; findings below may reference images not displayed]

FINDINGS: MENISCI

Medial meniscus:  Intact

Lateral meniscus:  Intact

LIGAMENTS

Cruciates:  Intact

Collaterals:  Intact

CARTILAGE

Patellofemoral: Advanced degenerative chondrosis with areas of full
or near full-thickness cartilage loss, joint space narrowing,
spurring and subchondral cystic change.

Medial: Mild to moderate degenerative chondrosis with spurring
changes.

Lateral:  Moderate degenerative chondrosis with spurring changes.

Joint:  Small joint effusion and moderate synovitis.

Popliteal Fossa:  Small Baker's cyst.

Extensor Mechanism: The patella retinacular structures are intact
and the quadriceps and patellar tendons are intact. Fairly
significant edema like signal changes in the upper lateral aspect of
Hoffa's fat typically seen with lateral patellar compression
syndrome or patellar tracking disorders. The TT-TG distance is
normal at 6.5 mm.

Bones:  No acute bony findings.

Other: Unremarkable knee musculature.
IMPRESSION: 1. Intact ligamentous structures and no acute bony findings.
2. No meniscal tears.
3. Tricompartmental degenerative changes most significant at the
patellofemoral joint.
4. Fairly significant edema like signal changes in the upper lateral
aspect of Hoffa's fat typically seen with lateral patellar
compression syndrome or patellar tracking disorders.
5. Small joint effusion and moderate synovitis. Small Baker's cyst.

## 2022-01-30 ENCOUNTER — Other Ambulatory Visit: Payer: Self-pay | Admitting: Vascular Surgery

## 2022-02-12 DIAGNOSIS — I10 Essential (primary) hypertension: Secondary | ICD-10-CM | POA: Diagnosis not present

## 2022-02-12 DIAGNOSIS — E1165 Type 2 diabetes mellitus with hyperglycemia: Secondary | ICD-10-CM | POA: Diagnosis not present

## 2022-02-12 DIAGNOSIS — E139 Other specified diabetes mellitus without complications: Secondary | ICD-10-CM | POA: Diagnosis not present

## 2022-02-17 DIAGNOSIS — G4733 Obstructive sleep apnea (adult) (pediatric): Secondary | ICD-10-CM | POA: Diagnosis not present

## 2022-03-13 DIAGNOSIS — E139 Other specified diabetes mellitus without complications: Secondary | ICD-10-CM | POA: Diagnosis not present

## 2022-03-13 DIAGNOSIS — E1165 Type 2 diabetes mellitus with hyperglycemia: Secondary | ICD-10-CM | POA: Diagnosis not present

## 2022-03-18 DIAGNOSIS — M549 Dorsalgia, unspecified: Secondary | ICD-10-CM | POA: Diagnosis not present

## 2022-03-20 DIAGNOSIS — G4733 Obstructive sleep apnea (adult) (pediatric): Secondary | ICD-10-CM | POA: Diagnosis not present

## 2022-04-19 DIAGNOSIS — G4733 Obstructive sleep apnea (adult) (pediatric): Secondary | ICD-10-CM | POA: Diagnosis not present

## 2022-04-30 DIAGNOSIS — I4719 Other supraventricular tachycardia: Secondary | ICD-10-CM | POA: Diagnosis not present

## 2022-04-30 DIAGNOSIS — I10 Essential (primary) hypertension: Secondary | ICD-10-CM | POA: Diagnosis not present

## 2022-04-30 DIAGNOSIS — I739 Peripheral vascular disease, unspecified: Secondary | ICD-10-CM | POA: Diagnosis not present

## 2022-04-30 DIAGNOSIS — I2510 Atherosclerotic heart disease of native coronary artery without angina pectoris: Secondary | ICD-10-CM | POA: Diagnosis not present

## 2022-05-22 DIAGNOSIS — J329 Chronic sinusitis, unspecified: Secondary | ICD-10-CM | POA: Diagnosis not present

## 2022-05-22 DIAGNOSIS — J40 Bronchitis, not specified as acute or chronic: Secondary | ICD-10-CM | POA: Diagnosis not present

## 2022-05-30 ENCOUNTER — Telehealth: Payer: Self-pay

## 2022-05-30 NOTE — Patient Instructions (Signed)
Visit Information  Thank you for taking time to visit with me today. Please don't hesitate to contact me if I can be of assistance to you.   Following are the goals we discussed today:   Goals Addressed             This Visit's Progress    COMPLETED: Care Coordination Activieites-No follow up required       Care Coordination Interventions: Advised patient to schedule annual physical.  Discussed diabetes management and [facility] services and support.  Patient declined.          .   If you are experiencing a Mental Health or [place] or need someone to talk to, please call the Suicide and Crisis Lifeline: 988   Patient verbalizes understanding of instructions and care plan provided today and agrees to view in [place]. Active MyChart status and patient understanding of how to access instructions and care plan via MyChart confirmed with patient.     No further follow up required: decline  Jone Baseman, RN, MSN [place] [facility] Care Management Coordinator Direct Line [phone]

## 2022-05-30 NOTE — Patient Outreach (Signed)
  Care Coordination   Initial Visit Note   05/30/2022 Name: OSMARA DRUMMONDS MRN: [id number] DOB: 12/22/1965  Sim Boast Anker is a 56 y.o. year old female who sees [place], Angelique Blonder, MD for primary care. I spoke with  Wynelle Beckmann by phone today.  What matters to the patients health and wellness today?  none    Goals Addressed             This Visit's Progress    COMPLETED: Care Coordination Activieites-No follow up required       Care Coordination Interventions: Advised patient to schedule annual physical.  Discussed diabetes management and [facility] services and support.  Patient declined.          SDOH assessments and interventions completed:  Yes  SDOH Interventions Today    Flowsheet Row Most Recent Value  SDOH Interventions   Housing Interventions Intervention Not Indicated  Transportation Interventions Intervention Not Indicated        Care Coordination Interventions Activated:  Yes  Care Coordination Interventions:  Yes, provided   Follow up plan: No further intervention required.   Encounter Outcome:  Pt. Visit Completed   Jone Baseman, RN, MSN [place] [facility] Care Management Coordinator Direct Line [phone]

## 2022-06-04 ENCOUNTER — Other Ambulatory Visit: Payer: Self-pay | Admitting: *Deleted

## 2022-06-04 DIAGNOSIS — I739 Peripheral vascular disease, unspecified: Secondary | ICD-10-CM

## 2022-06-13 ENCOUNTER — Ambulatory Visit: Payer: Federal, State, Local not specified - PPO | Admitting: Physician Assistant

## 2022-06-13 ENCOUNTER — Ambulatory Visit (HOSPITAL_COMMUNITY)
Admission: RE | Admit: 2022-06-13 | Discharge: 2022-06-13 | Disposition: A | Discharge status: Home / Self Care | Payer: Federal, State, Local not specified - PPO | Source: Ambulatory Visit | Attending: Vascular Surgery | Admitting: Vascular Surgery

## 2022-06-13 VITALS — BP 158/84 | HR 89 | Temp 98.2°F | Ht 65.0 in | Wt 170.0 lb

## 2022-06-13 DIAGNOSIS — I739 Peripheral vascular disease, unspecified: Secondary | ICD-10-CM | POA: Diagnosis not present

## 2022-06-13 NOTE — Progress Notes (Signed)
Office Note     CC:  follow up Requesting Provider:  Angelina Sheriff, MD  HPI: Karen Wu is a 56 y.o. (07-31-1965) female who presents for follow up of PAD and carotid artery disease. She has hx of left common iliac stenting by Dr. Donzetta Matters August 2020. This was performed due to left great toe ingrown toenail that was nonhealing. The stent following intervention subsequently occluded in September 2020 was recanalized and re stented by Dr. Trula Slade. Her great toe healed and she has had no associated symptoms since. Today she denies any pain on ambulation or rest, no new ulceration or non healing wounds.  She denies any visual changes, slurred speech, facial drooping, unilateral upper or lower extremity weakness or numbness. She is compliant with Aspirin, statin and Brillinta.   She does report that currently she is out of work due to a right elbow injury. She had stopped smoking but admits to restarting.  The pt is on a statin for cholesterol management.  The pt is on a daily aspirin.   Other AC:  Brilinta The pt is on CCB, ARB, HCTZ for hypertension.   The pt is diabetic.   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 of being hard to wake up post-op   GERD (gastroesophageal reflux disease)    OTC as needed   Graves' disease    no current med.   Heart murmur    as a child, states no problems   History of anemia    no problems since endometrial ablation   Hypertension    under control with meds., per pt.; has been on med. x 21 yr.   Insulin dependent diabetes mellitus    Trigger finger of right hand 10/2014   thumb and long finger    Past Surgical History:   Procedure Laterality Date   ABDOMINAL AORTOGRAM W/LOWER EXTREMITY Bilateral 03/16/2019   Procedure: ABDOMINAL AORTOGRAM W/LOWER EXTREMITY;  Surgeon: Waynetta Sandy, MD;  Location: [facility];  Service: Cardiovascular;  Laterality: Bilateral;   BLADDER SUSPENSION  01/2014   BREAST MASS EXCISION Left 05/30/2005   ENDOMETRIAL ABLATION  01/2014   FOOT SURGERY Left    exc. cyst and bone fragments   LOWER EXTREMITY ANGIOGRAPHY Bilateral 04/07/2019   Procedure: LOWER EXTREMITY ANGIOGRAPHY;  Surgeon: Serafina Mitchell, MD;  Location: [facility];  Service: Cardiovascular;  Laterality: Bilateral;   MICROLARYNGOSCOPY WITH CO2 LASER AND EXCISION OF VOCAL CORD LESION  03/2014   PERIPHERAL VASCULAR INTERVENTION Left 03/16/2019   Procedure: PERIPHERAL VASCULAR INTERVENTION;  Surgeon: Waynetta Sandy, MD;  Location: [facility];  Service: Cardiovascular;  Laterality: Left;  Iliac   PERIPHERAL VASCULAR INTERVENTION Left 04/07/2019   Procedure: PERIPHERAL VASCULAR INTERVENTION;  Surgeon: Serafina Mitchell, MD;  Location: [facility];  Service: Cardiovascular;  Laterality: Left;  Common Iliac and Thrombectomy   SHOULDER ARTHROSCOPY WITH ROTATOR CUFF REPAIR Right    TONSILLECTOMY AND ADENOIDECTOMY     TRIGGER FINGER RELEASE Right 10/28/2014   Procedure: RIGHT THUMB AND LONG  FINGER RELEASE TRIGGER ;  Surgeon: Leanora Cover, MD;  Location: [facility];  Service: Orthopedics;  Laterality: Right;   TUBAL LIGATION     UMBILICAL HERNIA REPAIR      Social History   Socioeconomic History   Marital status: Married    Spouse name: Not on file   Number of children: Not on file   Years of education: Not on file   Highest education level: Not on file  Occupational History   Not on file  Tobacco Use   Smoking status: Former    Packs/day: 1.50    Years: 30.00    Total pack years: 45.00    Types: Cigarettes    Quit date: 02/2019    Years since quitting: 3.3    Smokeless tobacco: Never  Vaping Use   Vaping Use: Never used  Substance and Sexual Activity   Alcohol use: No   Drug use: No   Sexual activity: Not on file  Other Topics Concern   Not on file  Social History Narrative   Not on file   Social Determinants of Health   Financial Resource Strain: Not on file  Food Insecurity: Not on file  Transportation Needs: No Transportation Needs (05/30/2022)   PRAPARE - Hydrologist (Medical): No    Lack of Transportation (Non-Medical): No  Physical Activity: Not on file  Stress: Not on file  Social Connections: Not on file  Intimate Partner Violence: Not on file    Family History  Problem Relation Age of Onset   Anesthesia problems Mother        hard to wake up post-op   Heart disease Mother    Kidney failure Mother    Stroke Mother    Heart attack Mother    Diabetes Mother    Anesthesia problems Father        woke up during surgery   Diabetes Father    High blood pressure Father    Kidney failure Maternal Grandmother    Heart disease Maternal Grandfather    Diabetes Maternal Grandfather    Stroke Paternal Grandmother     Current Outpatient Medications  Medication Sig Dispense Refill   Albuterol Sulfate (PROAIR RESPICLICK) 606 (90 Base) MCG/ACT AEPB Inhale 1-2 puffs into the lungs 3 (three) times daily as needed (wheezing/shortness of breath).      ALPRAZolam (XANAX) 0.25 MG tablet Take 1 tablet (0.25 mg total) by mouth daily. 30 tablet 2   amLODipine (NORVASC) 5 MG tablet Take 5 mg by mouth daily.     aspirin EC 81 MG tablet Take 81 mg by mouth daily.      BRILINTA 90 MG TABS tablet TAKE 1 TABLET BY MOUTH TWICE A DAY 60 tablet 3   empagliflozin (JARDIANCE) 25 MG TABS tablet Take 25 mg by mouth daily.      famotidine (PEPCID) 40 MG tablet Take 40 mg by mouth at bedtime.      fluconazole (DIFLUCAN) 150 MG tablet Take 150 mg by mouth daily.     gabapentin (NEURONTIN) 300 MG capsule Take 300 mg by  mouth 2 (two) times daily.      HUMIRA PEN 40 MG/0.4ML PNKT Inject 40 mg into the skin every 14 (fourteen) days. Fridays.     hydrochlorothiazide (HYDRODIURIL) 25 MG tablet Take 25 mg by mouth daily.     hydrocortisone (ANUSOL-HC) 25 MG suppository Place 25 mg rectally at bedtime.     irbesartan (AVAPRO) 150  MG tablet Take 150 mg by mouth daily.     LEVEMIR FLEXTOUCH 100 UNIT/ML Pen Inject 5-45 Units into the skin See admin instructions. Inject 5 units in the morning & 45 units at bedtime     metFORMIN (GLUCOPHAGE) 1000 MG tablet Take 1,000 mg by mouth 2 (two) times daily with a meal.     omeprazole (PRILOSEC) 20 MG capsule Take 20 mg by mouth every morning.     oxybutynin (DITROPAN-XL) 10 MG 24 hr tablet Take 10 mg by mouth at bedtime.      PENNSAID 2 % SOLN Apply 1 application topically 2 (two) times daily as needed (hip pain.).     QUEtiapine (SEROQUEL) 50 MG tablet TAKE 2-3 TABLETS BY MOUTH DAILY AT BEDTIME 270 tablet 2   rosuvastatin (CRESTOR) 20 MG tablet Take 20 mg by mouth at bedtime.      TRULICITY 1.5 YW/7.3XT SOPN      Venlafaxine HCl 225 MG TB24 Take 225 mg by mouth at bedtime.     No current facility-administered medications for this visit.    Allergies  Allergen Reactions   Metoprolol Tartrate Other (See Comments)     REVIEW OF SYSTEMS:  '[X]'$  denotes positive finding, '[ ]'$  denotes negative finding Cardiac  Comments:  Chest pain or chest pressure:    Shortness of breath upon exertion:    Short of breath when lying flat:    Irregular heart rhythm:        Vascular    Pain in calf, thigh, or hip brought on by ambulation:    Pain in feet at night that wakes you up from your sleep:     Blood clot in your veins:    Leg swelling:         Pulmonary    Oxygen at home:    Productive cough:     Wheezing:         Neurologic    Sudden weakness in arms or legs:     Sudden numbness in arms or legs:     Sudden onset of difficulty speaking or slurred speech:    Temporary loss  of vision in one eye:     Problems with dizziness:         Gastrointestinal    Blood in stool:     Vomited blood:         Genitourinary    Burning when urinating:     Blood in urine:        Psychiatric    Major depression:         Hematologic    Bleeding problems:    Problems with blood clotting too easily:        Skin    Rashes or ulcers:        Constitutional    Fever or chills:      PHYSICAL EXAMINATION:  Vitals:   06/13/22 0834  BP: (!) 158/84  Pulse: 89  Temp: 98.2 F (36.8 C)  TempSrc: Temporal  SpO2: 97%  Weight: 170 lb (77.1 kg)  Height: '5\' 5"'$  (1.651 m)    General:  WDWN in NAD; vital signs documented above Gait: normal HENT: WNL, normocephalic Pulmonary: normal non-labored breathing , without wheezing Cardiac: regular HR, without  Murmurs without carotid bruit Vascular Exam/Pulses:  Right Left  Radial 2+ (normal) 2+ (normal)  Femoral 2+ (normal) 2+ (normal)  Popliteal Not palpable Not palpable  DP 2+ (normal) 2+ (normal)  PT 1+ (weak) 1+ (weak)  Extremities: without ischemic changes, without Gangrene , without cellulitis; without open wounds;  Musculoskeletal: no muscle wasting or atrophy  Neurologic: A&O X 3;  No focal weakness or paresthesias are detected Psychiatric:  The pt has Normal affect.   Non-Invasive Vascular Imaging:   +-------+-----------+-----------+------------+------------+  ABI/TBIToday's ABIToday's TBIPrevious ABIPrevious TBI  +-------+-----------+-----------+------------+------------+  Right 1.25       0.75       1.24        0.72          +-------+-----------+-----------+------------+------------+  Left  1.20       0.50       1.24        near absent   +-------+-----------+-----------+------------+------------+    ASSESSMENT/PLAN:: 56 y.o. female here for follow up for PAD and carotid artery disease. She has no claudication, rest pain or tissue loss. No symptoms associated with carotid artery disease.   - Will not need carotid duplex for 5 years - Continue Aspirin, Statin - Discussed smoking cessation - Encourage exercise regimen - She will follow up in 1 year with repeat ABI   Karoline Caldwell, PA-C [facility] [phone]  Clinic MD:  Donzetta Matters

## 2022-06-23 ENCOUNTER — Other Ambulatory Visit: Payer: Self-pay | Admitting: Vascular Surgery

## 2022-07-25 ENCOUNTER — Telehealth: Payer: Self-pay

## 2022-07-25 NOTE — Telephone Encounter (Signed)
Patient called and lvm statingshe is currently uninsured and needs to refill her Brilinta.   I returned pt's call and LVMTCB.

## 2022-07-30 ENCOUNTER — Telehealth: Payer: Self-pay

## 2022-07-30 NOTE — Telephone Encounter (Signed)
Pt called requesting advice d/t high cost of Brilinta. Pt lost health insurance and can't afford medication. Pt has enough to last through weekend. Gave pt savings card info, but it only was going to save her approx $75. Gave her info on assistance application through [employer].  Spoke with Otis Dials, Isabela who advised asking Dr. Donzetta Matters. Sent staff msg to Dr. Donzetta Matters, but d/t being out, he had not responded.  Pt called again, two identifiers used. Pt explained that she tried to get assistance through [employer], but after going through the entire application, it wouldn't approve because she doesn't have any insurance. Called [place], but it's closed for [place]. Spoke with [place], PA who advised pt take ASA 81 mg and if that changes when Donzetta Matters is back in the office, the pt will be called. Confirmed understanding.

## 2022-08-02 ENCOUNTER — Telehealth: Payer: Self-pay

## 2022-08-02 NOTE — Telephone Encounter (Signed)
-----  Message from Waynetta Sandy, MD sent at 08/02/2022 12:14 PM EST ----- Regarding: RE: Anticoag Therapy Aspirin only should suffice. ----- Message ----- From: Gevena Mart, RN Sent: 07/27/2022   3:32 PM EST To: Waynetta Sandy, MD Subject: Anticoag Therapy                               Pt previously had insurance coverage, but is now out of work and can't afford Brilinta even with savings card ($355/mo). She will be out of med on Monday. Does she still need to be on it? Can she just take ASA only? I asked Corrie and she told me to ask you. Just trying to see what her options are. Thanks, Glenard Haring, RN - Triage

## 2022-08-03 ENCOUNTER — Telehealth: Payer: Self-pay | Admitting: *Deleted

## 2022-08-03 NOTE — Telephone Encounter (Signed)
Patient called inquiring again concerning only taking aspirin.  Referring to message sent by Dr Donzetta Matters 08/02/2022, "aspirin should suffice".  Called patient and left message on voice mail.  Instructed patient tal cal if any additional questions.

## 2022-10-10 ENCOUNTER — Ambulatory Visit (INDEPENDENT_AMBULATORY_CARE_PROVIDER_SITE_OTHER): Payer: Federal, State, Local not specified - PPO | Admitting: Adult Health

## 2022-10-10 ENCOUNTER — Encounter: Payer: Self-pay | Admitting: Adult Health

## 2022-10-10 DIAGNOSIS — F411 Generalized anxiety disorder: Secondary | ICD-10-CM

## 2022-10-10 DIAGNOSIS — F410 Panic disorder [episodic paroxysmal anxiety] without agoraphobia: Secondary | ICD-10-CM

## 2022-10-10 DIAGNOSIS — F331 Major depressive disorder, recurrent, moderate: Secondary | ICD-10-CM

## 2022-10-10 DIAGNOSIS — G4700 Insomnia, unspecified: Secondary | ICD-10-CM

## 2022-10-10 MED ORDER — ALPRAZOLAM 0.25 MG PO TABS: 0.2500 mg | ORAL_TABLET | Freq: Every day | ORAL | 2 refills | Status: DC

## 2022-10-10 MED ORDER — QUETIAPINE FUMARATE 50 MG PO TABS: ORAL_TABLET | ORAL | 2 refills | Status: DC

## 2022-10-10 NOTE — Progress Notes (Signed)
Karen Wu [id number] Dec 07, 1965 57 y.o.  Subjective:   Patient ID:  Karen Wu is a 57 y.o. (DOB 08-27-1965) female.  Chief Complaint: No chief complaint on file.   HPI Karen Wu presents to the office today for follow-up of MDD, GAD, insomnia,and panic attacks.  Last seen 04/20/2020.  Describes mood today as "not good". Pleasant. Appropriate. Tearful at times. Mood symptoms - reports depression, anxiety, and irritability. Reports panic attacks. Reports worry, rumination, and over thinking. Stating "I'm not doing good". Reports increased health issues since last visit. Reports increased stressors within work setting. Has been unable to return to work setting with physical injuries. Released from worker's compensation. Followed by other providers. Decreased interest and motivation. Taking medications as prescribed.  Energy levels lower. Active, does not have a regular exercise routine. Enjoys some usual interests and activities. Married. Lives with husband of 83 years. Has 3 children - one at home - 5, 42, and 20. Has 5 grandchildren.  Appetite adequate. Weight gain 169 to 174 pounds.  Reports sleeping difficulties - sleeps a few hours a dayUnable to use CPAP - mask is "suffocating".  Focus and concentration difficulties - difficulties reading or completing tasks. Completing some tasks. Managing minimal aspects of household. Currently out of work. Has been out of work full time since 08/16/2019 and then worked part time until 05/19/2021. Does not feel like she is ready to return o work at this time. Denies SI or HI.  Denies AH or VH Denies self harm. Denies substance use.    Previous medications: Paxil   PHQ2-9    Flowsheet Row Telephone from 05/30/2022 in [facility]  PHQ-2 Total Score 1        Review of Systems:  Review of Systems  Musculoskeletal:  Negative for gait problem.  Neurological:  Negative for tremors.   Psychiatric/Behavioral:         Please refer to HPI    Medications: I have reviewed the patient's current medications.  Current Outpatient Medications  Medication Sig Dispense Refill   Albuterol Sulfate (PROAIR RESPICLICK) [id number] (90 Base) MCG/ACT AEPB Inhale 1-2 puffs into the lungs 3 (three) times daily as needed (wheezing/shortness of breath).      ALPRAZolam (XANAX) 0.25 MG tablet Take 1 tablet (0.25 mg total) by mouth daily. 30 tablet 2   amLODipine (NORVASC) 5 MG tablet Take 5 mg by mouth daily.     aspirin EC 81 MG tablet Take 81 mg by mouth daily.      BRILINTA 90 MG TABS tablet TAKE 1 TABLET BY MOUTH TWICE A DAY 60 tablet 3   empagliflozin (JARDIANCE) 25 MG TABS tablet Take 25 mg by mouth daily.      famotidine (PEPCID) 40 MG tablet Take 40 mg by mouth at bedtime.      fluconazole (DIFLUCAN) 150 MG tablet Take 150 mg by mouth daily.     gabapentin (NEURONTIN) 300 MG capsule Take 300 mg by mouth 2 (two) times daily.      HUMIRA PEN 40 MG/0.4ML PNKT Inject 40 mg into the skin every 14 (fourteen) days. Fridays.     hydrochlorothiazide (HYDRODIURIL) 25 MG tablet Take 25 mg by mouth daily.     hydrocortisone (ANUSOL-HC) 25 MG suppository Place 25 mg rectally at bedtime.     irbesartan (AVAPRO) 150 MG tablet Take 150 mg by mouth daily.     LEVEMIR FLEXTOUCH 100 UNIT/ML Pen Inject 5-45 Units into the skin See admin  instructions. Inject 5 units in the morning & 45 units at bedtime     metFORMIN (GLUCOPHAGE) 1000 MG tablet Take 1,000 mg by mouth 2 (two) times daily with a meal.     omeprazole (PRILOSEC) 20 MG capsule Take 20 mg by mouth every morning.     oxybutynin (DITROPAN-XL) 10 MG 24 hr tablet Take 10 mg by mouth at bedtime.      PENNSAID 2 % SOLN Apply 1 application topically 2 (two) times daily as needed (hip pain.).     QUEtiapine (SEROQUEL) 50 MG tablet TAKE 2-3 TABLETS BY MOUTH DAILY AT BEDTIME 270 tablet 2   rosuvastatin (CRESTOR) 20 MG tablet Take 20 mg by mouth at bedtime.       TRULICITY 1.5 0000000 SOPN      Venlafaxine HCl 225 MG TB24 Take 225 mg by mouth at bedtime.     No current facility-administered medications for this visit.    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 of being hard to wake up post-op   GERD (gastroesophageal reflux disease)    OTC as needed   Graves' disease    no current med.   Heart murmur    as a child, states no problems   History of anemia    no problems since endometrial ablation   Hypertension    under control with meds., per pt.; has been on med. x 21 yr.   Insulin dependent diabetes mellitus    Trigger finger of right hand 10/2014   thumb and long finger    Past Medical History, Surgical history, Social history, and Family history were reviewed and updated as appropriate.   Please see review of systems for further details on the patient's review from today.   Objective:   Physical Exam:  LMP 09/30/2014 (Approximate)   Physical Exam Constitutional:      General: She is not in acute distress. Musculoskeletal:        General: No deformity.  Neurological:     Mental Status: She is alert and oriented to person, place, and time.     Coordination: Coordination normal.  Psychiatric:        Attention and Perception: Attention and perception normal. She does not perceive auditory or visual hallucinations.        Mood and Affect: Mood normal. Mood is not anxious or depressed. Affect is not labile, blunt, angry or inappropriate.        Speech: Speech normal.        Behavior: Behavior normal.         Thought Content: Thought content normal. Thought content is not paranoid or delusional. Thought content does not include homicidal or suicidal ideation. Thought content does not include homicidal or suicidal plan.        Cognition and Memory: Cognition and memory normal.        Judgment: Judgment normal.     Comments: Insight intact     Lab Review:  Component Value Date/Time   NA 140 04/07/2019 0914   K 4.1 04/07/2019 0914   CL 102 04/07/2019 0914   CO2 20 (L) 03/16/2019 1757   GLUCOSE 168 (H) 04/07/2019 0914   BUN 14 04/07/2019 0914   CREATININE 0.50 04/07/2019 0914   CREATININE 0.61 09/03/2012 1008   CALCIUM 8.6 (L) 03/16/2019 1757   GFRNONAA >60 03/16/2019 1757   GFRNONAA >60 09/03/2012 1008   GFRAA >60 03/16/2019 1757   GFRAA >60 09/03/2012 1008       Component Value Date/Time   WBC 12.9 (H) 03/18/2019 1123   RBC 3.12 (L) 03/18/2019 1123   HGB 14.6 04/07/2019 0914   HCT 43.0 04/07/2019 0914   PLT 320 03/18/2019 1123   MCV 86.5 03/18/2019 1123   MCH 28.2 03/18/2019 1123   MCHC 32.6 03/18/2019 1123   RDW 16.2 (H) 03/18/2019 1123   LYMPHSABS 2.5 03/18/2019 0302   MONOABS 1.4 (H) 03/18/2019 0302   EOSABS 0.3 03/18/2019 0302   BASOSABS 0.1 03/18/2019 0302    No results found for: "POCLITH", "LITHIUM"   No results found for: "PHENYTOIN", "PHENOBARB", "VALPROATE", "CBMZ"   .res Assessment: Plan:    Plan:  Continue Effexor XR 225mg  every morning  Restart Xanax 0.25mg  ODT - prn panic attacks - has not used since January. Restart Seroquel 150mg  at hs take one tablet at bedtime x 3 nights, then increase to 100mg  at hs.  Patient totally disabled and unable to work at this time. Out of work 10/10/2022 through 01/10/2023.  RTC 4 weeks.  Patient advised to contact office with any questions, adverse effects, or acute worsening in signs and symptoms.  Discussed potential metabolic side effects associated with atypical antipsychotics, as well as potential risk  for movement side effects. Advised pt to contact office if movement side effects occur.   Discussed potential benefits, risk, and side effects of benzodiazepines to include potential risk of tolerance and dependence, as well as possible drowsiness. Advised patient not to drive if experiencing drowsiness and to take lowest possible effective dose to minimize risk of dependence and tolerance.  There are no diagnoses linked to this encounter.   Please see After Visit Summary for patient specific instructions.  No future appointments.  No orders of the defined types were placed in this encounter.   -------------------------------

## 2022-10-24 ENCOUNTER — Other Ambulatory Visit: Payer: Self-pay | Admitting: Adult Health

## 2022-10-24 DIAGNOSIS — G4700 Insomnia, unspecified: Secondary | ICD-10-CM

## 2022-10-24 DIAGNOSIS — F331 Major depressive disorder, recurrent, moderate: Secondary | ICD-10-CM

## 2022-10-25 ENCOUNTER — Telehealth: Payer: Self-pay | Admitting: Adult Health

## 2022-10-25 NOTE — Telephone Encounter (Signed)
Pt brought in letter from [employer]. Need diagnosis on previous letter written 10/07/22. Contact pt to pick up when completed. Time sensitive. 4 days to return. Gave to RM.

## 2022-11-07 ENCOUNTER — Ambulatory Visit: Payer: Self-pay | Admitting: Adult Health

## 2022-11-23 ENCOUNTER — Encounter: Payer: Self-pay | Admitting: Adult Health

## 2022-11-23 ENCOUNTER — Ambulatory Visit (INDEPENDENT_AMBULATORY_CARE_PROVIDER_SITE_OTHER): Payer: Self-pay | Admitting: Adult Health

## 2022-11-23 DIAGNOSIS — G4700 Insomnia, unspecified: Secondary | ICD-10-CM

## 2022-11-23 DIAGNOSIS — F411 Generalized anxiety disorder: Secondary | ICD-10-CM

## 2022-11-23 DIAGNOSIS — F410 Panic disorder [episodic paroxysmal anxiety] without agoraphobia: Secondary | ICD-10-CM

## 2022-11-23 DIAGNOSIS — F331 Major depressive disorder, recurrent, moderate: Secondary | ICD-10-CM

## 2022-11-23 NOTE — Progress Notes (Signed)
Karen Wu [id number] 03-09-1966 57 y.o.  Subjective:   Patient ID:  SECRET CRADLE is a 57 y.o. (DOB 1966/04/01) female.  Chief Complaint: No chief complaint on file.   HPI Karen Wu presents to the office today for follow-up of MDD, GAD, insomnia,and panic attacks.  Describes mood today as "not a whole lot better". Pleasant. Appropriate. Tearful at times. Mood symptoms - reports depression and anxiety. Reports irritability - "I'm very irritable". Reports she has difficulty not knowing when not to say things she shouldn't. Reports arguing with sister and son. Reports panic attacks - when home alone - "walls closing in on me". Reports worry, rumination, and over thinking. Reports health issues - not able to use her arm to pick up 20 pounds - needing a surgery - can't afford it. Does not feel like she is able to return to work with mental health and physical disabilities. Stating "I'm still struggling". Feels like the addition of Seroquel has been helpful. She reports some toleration issues with taking a higher dose of medication - taking Seroquel 50mg  at bedtime and working on increasing the dose. Decreased interest and motivation. Taking medications as prescribed.  Energy levels better some days than others. Active, does not have a regular exercise routine.Walking some days. Enjoys some usual interests and activities. Married. Lives with husband of 34 years and 2 dogs. Has 3 children - one at home - 32, 59, and 20. Has 5 grandchildren.  Appetite adequate. Weight stable - "maintaining" - 174 pounds.  Reports sleeping 6 to 8 hours, 4 to 5 days a week and other nights sleeps less or not at all. Trying to start using her CPAP again. Focus and concentration "a little better" - has been able to sit down and read for about 20 to 30 minutes. Has difficulties reading a book and has stopped trying. Completing some tasks. Managing minimal aspects of household. Currently out of work. Has been out of work full  time since 08/16/2019 and then worked part time until 05/19/2021. Does not feel like she is ready to return to work at this time. Denies SI or HI.  Denies AH or VH Denies self harm. Denies substance use.    Previous medications: Paxil   PHQ2-9    Flowsheet Row Telephone from 05/30/2022 in [facility]  PHQ-2 Total Score 1        Review of Systems:  Review of Systems  Musculoskeletal:  Negative for gait problem.  Neurological:  Negative for tremors.  Psychiatric/Behavioral:         Please refer to HPI    Medications: I have reviewed the patient's current medications.  Current Outpatient Medications  Medication Sig Dispense Refill   Albuterol Sulfate (PROAIR RESPICLICK) 108 (90 Base) MCG/ACT AEPB Inhale 1-2 puffs into the lungs 3 (three) times daily as needed (wheezing/shortness of breath).      ALPRAZolam (XANAX) 0.25 MG tablet Take 1 tablet (0.25 mg total) by mouth daily. 30 tablet 2   amLODipine (NORVASC) 5 MG tablet Take 5 mg by mouth daily.     aspirin EC 81 MG tablet Take 81 mg by mouth daily.      BRILINTA 90 MG TABS tablet TAKE 1 TABLET BY MOUTH TWICE A DAY 60 tablet 3   empagliflozin (JARDIANCE) 25 MG TABS tablet Take 25 mg by mouth daily.      famotidine (PEPCID) 40 MG tablet Take 40 mg by mouth at bedtime.  fluconazole (DIFLUCAN) 150 MG tablet Take 150 mg by mouth daily.     gabapentin (NEURONTIN) 300 MG capsule Take 300 mg by mouth 2 (two) times daily.      HUMIRA PEN 40 MG/0.4ML PNKT Inject 40 mg into the skin every 14 (fourteen) days. Fridays.     hydrochlorothiazide (HYDRODIURIL) 25 MG tablet Take 25 mg by mouth daily.     hydrocortisone (ANUSOL-HC) 25 MG suppository Place 25 mg rectally at bedtime.     irbesartan (AVAPRO) 150 MG tablet Take 150 mg by mouth daily.     LEVEMIR FLEXTOUCH 100 UNIT/ML Pen Inject 5-45 Units into the skin See admin instructions. Inject 5 units in the morning & 45 units at bedtime      metFORMIN (GLUCOPHAGE) 1000 MG tablet Take 1,000 mg by mouth 2 (two) times daily with a meal.     omeprazole (PRILOSEC) 20 MG capsule Take 20 mg by mouth every morning.     oxybutynin (DITROPAN-XL) 10 MG 24 hr tablet Take 10 mg by mouth at bedtime.      PENNSAID 2 % SOLN Apply 1 application topically 2 (two) times daily as needed (hip pain.).     QUEtiapine (SEROQUEL) 50 MG tablet TAKE ONE TABLET AT BEDTIME FOR 3 NIGHTS, THEN INCREASE TO TWO TABLETS AT BEDTIME. 60 tablet 2   rosuvastatin (CRESTOR) 20 MG tablet Take 20 mg by mouth at bedtime.      TRULICITY 1.5 MG/0.5ML SOPN      Venlafaxine HCl 225 MG TB24 Take 225 mg by mouth at bedtime.     No current facility-administered medications for this visit.    Medication Side Effects: None  Allergies:  Allergies  Allergen Reactions   Metoprolol Tartrate Other (See Comments)    Past Medical History:  Diagnosis Date   Arthritis    hips, knees, lower back   Asthma    rare inhaler use   Brain tumor (HCC)    x 2 - appear to be meningiomas, per neurosurgeon's report   Dental crown present    Diabetes (HCC)    Family history of adverse reaction to anesthesia    pt's father woke up during surgery; pt's mother has hx. of being hard to wake up post-op   GERD (gastroesophageal reflux disease)    OTC as needed   Graves' disease    no current med.   Heart murmur    as a child, states no problems   History of anemia    no problems since endometrial ablation   Hypertension    under control with meds., per pt.; has been on med. x 21 yr.   Insulin dependent diabetes mellitus    Trigger finger of right hand 10/2014   thumb and long finger    Past Medical History, Surgical history, Social history, and Family history were reviewed and updated as appropriate.   Please see review of systems for further details on the patient's review from today.   Objective:   Physical Exam:  LMP 09/30/2014 (Approximate)   Physical Exam Constitutional:       General: She is not in acute distress. Musculoskeletal:        General: No deformity.  Neurological:     Mental Status: She is alert and oriented to person, place, and time.     Coordination: Coordination normal.  Psychiatric:        Attention and Perception: Attention and perception normal. She does not perceive auditory or visual hallucinations.  Mood and Affect: Mood normal. Mood is not anxious or depressed. Affect is not labile, blunt, angry or inappropriate.        Speech: Speech normal.        Behavior: Behavior normal.        Thought Content: Thought content normal. Thought content is not paranoid or delusional. Thought content does not include homicidal or suicidal ideation. Thought content does not include homicidal or suicidal plan.        Cognition and Memory: Cognition and memory normal.        Judgment: Judgment normal.     Comments: Insight intact     Lab Review:     Component Value Date/Time   NA 140 04/07/2019 0914   K 4.1 04/07/2019 0914   CL 102 04/07/2019 0914   CO2 20 (L) 03/16/2019 1757   GLUCOSE 168 (H) 04/07/2019 0914   BUN 14 04/07/2019 0914   CREATININE 0.50 04/07/2019 0914   CREATININE 0.61 09/03/2012 1008   CALCIUM 8.6 (L) 03/16/2019 1757   GFRNONAA >60 03/16/2019 1757   GFRNONAA >60 09/03/2012 1008   GFRAA >60 03/16/2019 1757   GFRAA >60 09/03/2012 1008       Component Value Date/Time   WBC 12.9 (H) 03/18/2019 1123   RBC 3.12 (L) 03/18/2019 1123   HGB 14.6 04/07/2019 0914   HCT 43.0 04/07/2019 0914   PLT 320 03/18/2019 1123   MCV 86.5 03/18/2019 1123   MCH 28.2 03/18/2019 1123   MCHC 32.6 03/18/2019 1123   RDW 16.2 (H) 03/18/2019 1123   LYMPHSABS 2.5 03/18/2019 0302   MONOABS 1.4 (H) 03/18/2019 0302   EOSABS 0.3 03/18/2019 0302   BASOSABS 0.1 03/18/2019 0302    No results found for: "POCLITH", "LITHIUM"   No results found for: "PHENYTOIN", "PHENOBARB", "VALPROATE", "CBMZ"   .res Assessment: Plan:    Plan:  Effexor XR  225mg  every morning Xanax 0.25mg  ODT - prn panic attacks - taking daily Seroquel 50mg  at hs - will try taking 1.5 tablets at bedtime  Patient totally disabled and unable to work at this time. Out of work 10/10/2022 through 01/10/2023.  RTC 4 weeks.  Patient advised to contact office with any questions, adverse effects, or acute worsening in signs and symptoms.  Discussed potential metabolic side effects associated with atypical antipsychotics, as well as potential risk for movement side effects. Advised pt to contact office if movement side effects occur.   Discussed potential benefits, risk, and side effects of benzodiazepines to include potential risk of tolerance and dependence, as well as possible drowsiness. Advised patient not to drive if experiencing drowsiness and to take lowest possible effective dose to minimize risk of dependence and tolerance.  Diagnoses and all orders for this visit:  Major depressive disorder, recurrent episode, moderate (HCC)  Generalized anxiety disorder  Panic attacks  Insomnia, unspecified type     Please see After Visit Summary for patient specific instructions.  No future appointments.  No orders of the defined types were placed in this encounter.   -------------------------------

## 2022-12-21 ENCOUNTER — Ambulatory Visit (INDEPENDENT_AMBULATORY_CARE_PROVIDER_SITE_OTHER): Payer: Self-pay | Admitting: Adult Health

## 2022-12-21 ENCOUNTER — Encounter: Payer: Self-pay | Admitting: Adult Health

## 2022-12-21 DIAGNOSIS — F410 Panic disorder [episodic paroxysmal anxiety] without agoraphobia: Secondary | ICD-10-CM

## 2022-12-21 DIAGNOSIS — G4700 Insomnia, unspecified: Secondary | ICD-10-CM

## 2022-12-21 DIAGNOSIS — F331 Major depressive disorder, recurrent, moderate: Secondary | ICD-10-CM

## 2022-12-21 DIAGNOSIS — F411 Generalized anxiety disorder: Secondary | ICD-10-CM

## 2022-12-21 MED ORDER — QUETIAPINE FUMARATE 100 MG PO TABS: ORAL_TABLET | ORAL | 2 refills | Status: DC

## 2022-12-21 MED ORDER — ALPRAZOLAM 0.25 MG PO TABS: 0.2500 mg | ORAL_TABLET | Freq: Every day | ORAL | 2 refills | Status: DC

## 2022-12-21 NOTE — Progress Notes (Signed)
[name] [id number] Apr 01, 1966 57 y.o.  Subjective:   Patient ID:  Karen Wu is a 57 y.o. (DOB [birth year]) female.  Chief Complaint: No chief complaint on file.   HPI Karen Wu presents to the office today for follow-up of MDD, GAD, insomnia,and panic attacks.  Describes mood today as "not good". Pleasant. Flat. Tearful throughout interview. Mood symptoms - reports depression and anxiety. Stating "I don't look froward to anything". Reports increased irritability - "I'm still very irritable". Reports worry, rumination, and over thinking. Reports panic attacks. Stating "I'm not doing good at all". Having to push herself to do things. Completing minimal tasks around the house. Does not feel like current medications are helping. Reports needing surgery on her arm, but cannot afford it. Does not feel like she is able to return to work with current mental health and physical disabilities. Stating "I'm not doing going". Feels like sleep medications are helpful, but is still struggling with her mood. Stating "I feel like I take one step forward and two steps back". Decreased interest and motivation. Taking medications as prescribed.  Energy levels lower. Active, does not have a regular exercise routine.Walking some days. Enjoys some usual interests and activities. Married. Lives with husband of 34 years and 2 dogs. Has 3 children - one at home - 18, 32, and 20. Has 5 grandchildren.  Appetite adequate. Weight stable - 174 pounds.  Sleeping 6 to 7 hours most night - using CPAP machine.  Focus and concentration "difficulties". Completing some tasks. Managing minimal aspects of household - "I don't care if things get done are not". Currently out of work. Has been out of work full time since 08/16/2019 and then worked part time until 05/19/2021. Does not feel like she is ready to return to work at this time. Denies SI or HI.  Denies AH or VH Denies self harm. Denies substance use.    Previous  medications: Paxil    PHQ2-9    Flowsheet Row Telephone from 05/30/2022 in [facility]  PHQ-2 Total Score 1        Review of Systems:  Review of Systems  Musculoskeletal:  Negative for gait problem.  Neurological:  Negative for tremors.  Psychiatric/Behavioral:         Please refer to HPI    Medications: I have reviewed the patient's current medications.  Current Outpatient Medications  Medication Sig Dispense Refill   Albuterol Sulfate (PROAIR RESPICLICK) 108 (90 Base) MCG/ACT AEPB Inhale 1-2 puffs into the lungs 3 (three) times daily as needed (wheezing/shortness of breath).      ALPRAZolam (XANAX) 0.25 MG tablet Take 1 tablet (0.25 mg total) by mouth daily. 30 tablet 2   amLODipine (NORVASC) 5 MG tablet Take 5 mg by mouth daily.     aspirin EC 81 MG tablet Take 81 mg by mouth daily.      BRILINTA 90 MG TABS tablet TAKE 1 TABLET BY MOUTH TWICE A DAY 60 tablet 3   empagliflozin (JARDIANCE) 25 MG TABS tablet Take 25 mg by mouth daily.      famotidine (PEPCID) 40 MG tablet Take 40 mg by mouth at bedtime.      fluconazole (DIFLUCAN) 150 MG tablet Take 150 mg by mouth daily.     gabapentin (NEURONTIN) 300 MG capsule Take 300 mg by mouth 2 (two) times daily.      HUMIRA PEN 40 MG/0.4ML PNKT Inject 40 mg into the skin every 14 (fourteen) days.  Fridays.     hydrochlorothiazide (HYDRODIURIL) 25 MG tablet Take 25 mg by mouth daily.     hydrocortisone (ANUSOL-HC) 25 MG suppository Place 25 mg rectally at bedtime.     irbesartan (AVAPRO) 150 MG tablet Take 150 mg by mouth daily.     LEVEMIR FLEXTOUCH 100 UNIT/ML Pen Inject 5-45 Units into the skin See admin instructions. Inject 5 units in the morning & 45 units at bedtime     metFORMIN (GLUCOPHAGE) 1000 MG tablet Take 1,000 mg by mouth 2 (two) times daily with a meal.     omeprazole (PRILOSEC) 20 MG capsule Take 20 mg by mouth every morning.     oxybutynin (DITROPAN-XL) 10 MG 24 hr tablet Take 10  mg by mouth at bedtime.      PENNSAID 2 % SOLN Apply 1 application topically 2 (two) times daily as needed (hip pain.).     QUEtiapine (SEROQUEL) 50 MG tablet TAKE ONE TABLET AT BEDTIME FOR 3 NIGHTS, THEN INCREASE TO TWO TABLETS AT BEDTIME. 60 tablet 2   rosuvastatin (CRESTOR) 20 MG tablet Take 20 mg by mouth at bedtime.      TRULICITY 1.5 MG/0.5ML SOPN      Venlafaxine HCl 225 MG TB24 Take 225 mg by mouth at bedtime.     No current facility-administered medications for this visit.    Medication Side Effects: None  Allergies:  Allergies  Allergen Reactions   Metoprolol Tartrate Other (See Comments)    Past Medical History:  Diagnosis Date   Arthritis    hips, knees, lower back   Asthma    rare inhaler use   Brain tumor (HCC)    x 2 - appear to be meningiomas, per neurosurgeon's report   Dental crown present    Diabetes (HCC)    Family history of adverse reaction to anesthesia    pt's father woke up during surgery; pt's mother has hx. of being hard to wake up post-op   GERD (gastroesophageal reflux disease)    OTC as needed   Graves' disease    no current med.   Heart murmur    as a child, states no problems   History of anemia    no problems since endometrial ablation   Hypertension    under control with meds., per pt.; has been on med. x 21 yr.   Insulin dependent diabetes mellitus    Trigger finger of right hand 10/2014   thumb and long finger    Past Medical History, Surgical history, Social history, and Family history were reviewed and updated as appropriate.   Please see review of systems for further details on the patient's review from today.   Objective:   Physical Exam:  LMP 09/30/2014 (Approximate)   Physical Exam Constitutional:      General: She is not in acute distress. Musculoskeletal:        General: No deformity.  Neurological:     Mental Status: She is alert and oriented to person, place, and time.     Coordination: Coordination normal.   Psychiatric:        Attention and Perception: Attention and perception normal. She does not perceive auditory or visual hallucinations.        Mood and Affect: Mood normal. Mood is not anxious or depressed. Affect is not labile, blunt, angry or inappropriate.        Speech: Speech normal.        Behavior: Behavior normal.  Thought Content: Thought content normal. Thought content is not paranoid or delusional. Thought content does not include homicidal or suicidal ideation. Thought content does not include homicidal or suicidal plan.        Cognition and Memory: Cognition and memory normal.        Judgment: Judgment normal.     Comments: Insight intact     Lab Review:     Component Value Date/Time   NA 140 04/07/2019 0914   K 4.1 04/07/2019 0914   CL 102 04/07/2019 0914   CO2 20 (L) 03/16/2019 1757   GLUCOSE 168 (H) 04/07/2019 0914   BUN 14 04/07/2019 0914   CREATININE 0.50 04/07/2019 0914   CREATININE 0.61 09/03/2012 1008   CALCIUM 8.6 (L) 03/16/2019 1757   GFRNONAA >60 03/16/2019 1757   GFRNONAA >60 09/03/2012 1008   GFRAA >60 03/16/2019 1757   GFRAA >60 09/03/2012 1008       Component Value Date/Time   WBC 12.9 (H) 03/18/2019 1123   RBC 3.12 (L) 03/18/2019 1123   HGB 14.6 04/07/2019 0914   HCT 43.0 04/07/2019 0914   PLT 320 03/18/2019 1123   MCV 86.5 03/18/2019 1123   MCH 28.2 03/18/2019 1123   MCHC 32.6 03/18/2019 1123   RDW 16.2 (H) 03/18/2019 1123   LYMPHSABS 2.5 03/18/2019 0302   MONOABS 1.4 (H) 03/18/2019 0302   EOSABS 0.3 03/18/2019 0302   BASOSABS 0.1 03/18/2019 0302    No results found for: "POCLITH", "LITHIUM"   No results found for: "PHENYTOIN", "PHENOBARB", "VALPROATE", "CBMZ"   .res Assessment: Plan:    Plan:  Effexor XR 225mg  every morning - PCP prescribing  Xanax 0.25mg  ODT - prn panic attacks - taking daily Seroquel 50mg  at hs - will try taking 1.5 tablets at bedtime  Add Vraylar 1.5mg  daily for mood symptoms - samples  given.  Patient totally disabled and unable to work at this time. Out of work 12/21/2022 through 02/20/2023.  RTC 4 weeks.  Patient advised to contact office with any questions, adverse effects, or acute worsening in signs and symptoms.  Discussed potential metabolic side effects associated with atypical antipsychotics, as well as potential risk for movement side effects. Advised pt to contact office if movement side effects occur.   Discussed potential benefits, risk, and side effects of benzodiazepines to include potential risk of tolerance and dependence, as well as possible drowsiness. Advised patient not to drive if experiencing drowsiness and to take lowest possible effective dose to minimize risk of dependence and tolerance. There are no diagnoses linked to this encounter.   Please see After Visit Summary for patient specific instructions.  No future appointments.  No orders of the defined types were placed in this encounter.   -------------------------------

## 2022-12-25 ENCOUNTER — Telehealth: Payer: Self-pay | Admitting: Adult Health

## 2022-12-25 NOTE — Telephone Encounter (Signed)
Pt called at 1:35p requesting the letter you were going to write for her to stay out of work.  Pls call her when it's ready to pick up.  Next appt 7/5

## 2022-12-25 NOTE — Telephone Encounter (Signed)
Did you already do a letter for her? I don't see any paper work

## 2022-12-26 NOTE — Telephone Encounter (Signed)
Pt aware and will pick up tomorrow. °

## 2022-12-26 NOTE — Telephone Encounter (Signed)
Letter is ready.

## 2023-01-01 ENCOUNTER — Telehealth: Payer: Self-pay | Admitting: Adult Health

## 2023-01-01 ENCOUNTER — Encounter: Payer: Self-pay | Admitting: Adult Health

## 2023-01-01 NOTE — Telephone Encounter (Signed)
We can try trazadone or Hydroxyzine.

## 2023-01-01 NOTE — Telephone Encounter (Signed)
Patient reporting Seroquel not helping her sleep. She has tried up to 150 mg in 2020 and 2021. In 2020 tried olanzapine. I did not see any other sleep meds in your note or in medication history.

## 2023-01-01 NOTE — Telephone Encounter (Signed)
Pt called reporting Quetiapine is not helping with sleep. Most nights 4 hours at the most and last night not at all. Contact pt @ [phone]  next apt 7/5

## 2023-01-02 ENCOUNTER — Other Ambulatory Visit: Payer: Self-pay | Admitting: Adult Health

## 2023-01-02 DIAGNOSIS — G4700 Insomnia, unspecified: Secondary | ICD-10-CM

## 2023-01-02 DIAGNOSIS — F331 Major depressive disorder, recurrent, moderate: Secondary | ICD-10-CM

## 2023-01-02 MED ORDER — TRAZODONE HCL 50 MG PO TABS: 50.0000 mg | ORAL_TABLET | Freq: Every day | ORAL | 0 refills | Status: DC

## 2023-01-02 NOTE — Telephone Encounter (Signed)
Rx sent for trazodone. Patient notified.

## 2023-01-09 ENCOUNTER — Telehealth: Payer: Self-pay | Admitting: Adult Health

## 2023-01-09 NOTE — Telephone Encounter (Signed)
Patient notified

## 2023-01-09 NOTE — Telephone Encounter (Signed)
Patient lvm at 11:39 stating the medication RM switched her to she is still not sleeping. "Slept like three hours last night." Pls rtc to discuss

## 2023-01-09 NOTE — Telephone Encounter (Signed)
At last visit Seroquel increased to 200 mg. Pt reported she wasn't sleeping. Trazodone 50 mg was called in. Patient reported pharmacist told her not to take Seroquel and trazodone so she is only taking the trazodone and is reporting she still isn't sleeping. Should she take both medications? Should we try hydroxyzine instead?  She has an appt on 7/1.

## 2023-01-10 ENCOUNTER — Other Ambulatory Visit: Payer: Self-pay | Admitting: Adult Health

## 2023-01-10 DIAGNOSIS — G4700 Insomnia, unspecified: Secondary | ICD-10-CM

## 2023-01-10 DIAGNOSIS — F331 Major depressive disorder, recurrent, moderate: Secondary | ICD-10-CM

## 2023-01-14 ENCOUNTER — Ambulatory Visit: Payer: Self-pay | Admitting: Adult Health

## 2023-01-14 ENCOUNTER — Encounter: Payer: Self-pay | Admitting: Adult Health

## 2023-01-14 DIAGNOSIS — F410 Panic disorder [episodic paroxysmal anxiety] without agoraphobia: Secondary | ICD-10-CM

## 2023-01-14 DIAGNOSIS — G4700 Insomnia, unspecified: Secondary | ICD-10-CM

## 2023-01-14 DIAGNOSIS — F331 Major depressive disorder, recurrent, moderate: Secondary | ICD-10-CM

## 2023-01-14 MED ORDER — HYDROXYZINE HCL 50 MG PO TABS
ORAL_TABLET | ORAL | 2 refills | Status: DC
Start: 2023-01-14 — End: 2023-02-06

## 2023-01-14 NOTE — Progress Notes (Signed)
Karen Wu [id number] [birth year] 57 y.o.  Subjective:   Patient ID:  Karen Wu is a 57 y.o. (DOB 1965-07-19) female.  Chief Complaint: No chief complaint on file.   HPI Karen Wu presents to the office today for follow-up of MDD, GAD, insomnia,and panic attacks.  Describes mood today as "about the same". Pleasant. Flat. Tearful throughout interview. Mood symptoms - reports decreased depression and anxiety. Reports irritability. Reports worry, rumination, and over thinking. Reports decreased panic attacks. Stating "I am having more normal days". Having to push herself to do things. Completing minimal tasks around the house. Feels like the medications are helping, but "I still have more bad days overall". Does not feel like she is able to return to work with current mental health and physical disabilities. Varying interest and motivation. Taking medications as prescribed.  Energy levels lower. Active, does not have a regular exercise routine. Walking some days. Enjoys some usual interests and activities. Married. Lives with husband of 34 years and 2 dogs. Has 3 children - one at home - 71, 83, and 20. Has 5 grandchildren.  Appetite adequate. Weight stable - 174 pounds.  Sleeping 3 to 5 hours most night - using CPAP machine.  Focus and concentration "difficulties". Completing some tasks. Managing minimal aspects of household. Currently out of work. Has been out of work full time since 08/16/2019 and then worked part time until 05/19/2021. Does not feel like she is ready to return to work at this time. Denies SI or HI.  Denies AH or VH Denies self harm. Denies substance use.    Previous medications: Paxil   PHQ2-9    Flowsheet Row Telephone from 05/30/2022 in [facility]  PHQ-2 Total Score 1        Review of Systems:  Review of Systems  Musculoskeletal:  Negative for gait problem.  Neurological:  Negative for tremors.   Psychiatric/Behavioral:         Please refer to HPI    Medications: I have reviewed the patient's current medications.  Current Outpatient Medications  Medication Sig Dispense Refill   hydrOXYzine (ATARAX) 50 MG tablet Take one to two tablets at bedtime for sleep. 60 tablet 2   Albuterol Sulfate (PROAIR RESPICLICK) 108 (90 Base) MCG/ACT AEPB Inhale 1-2 puffs into the lungs 3 (three) times daily as needed (wheezing/shortness of breath).      ALPRAZolam (XANAX) 0.25 MG tablet Take 1 tablet (0.25 mg total) by mouth daily. 30 tablet 2   amLODipine (NORVASC) 5 MG tablet Take 5 mg by mouth daily.     aspirin EC 81 MG tablet Take 81 mg by mouth daily.      BRILINTA 90 MG TABS tablet TAKE 1 TABLET BY MOUTH TWICE A DAY 60 tablet 3   empagliflozin (JARDIANCE) 25 MG TABS tablet Take 25 mg by mouth daily.      famotidine (PEPCID) 40 MG tablet Take 40 mg by mouth at bedtime.      fluconazole (DIFLUCAN) 150 MG tablet Take 150 mg by mouth daily.     gabapentin (NEURONTIN) 300 MG capsule Take 300 mg by mouth 2 (two) times daily.      HUMIRA PEN 40 MG/0.4ML PNKT Inject 40 mg into the skin every 14 (fourteen) days. Fridays.     hydrochlorothiazide (HYDRODIURIL) 25 MG tablet Take 25 mg by mouth daily.     hydrocortisone (ANUSOL-HC) 25 MG suppository Place 25 mg rectally at bedtime.     irbesartan (  AVAPRO) 150 MG tablet Take 150 mg by mouth daily.     LEVEMIR FLEXTOUCH 100 UNIT/ML Pen Inject 5-45 Units into the skin See admin instructions. Inject 5 units in the morning & 45 units at bedtime     metFORMIN (GLUCOPHAGE) 1000 MG tablet Take 1,000 mg by mouth 2 (two) times daily with a meal.     omeprazole (PRILOSEC) 20 MG capsule Take 20 mg by mouth every morning.     oxybutynin (DITROPAN-XL) 10 MG 24 hr tablet Take 10 mg by mouth at bedtime.      PENNSAID 2 % SOLN Apply 1 application topically 2 (two) times daily as needed (hip pain.).     QUEtiapine (SEROQUEL) 100 MG tablet TAKE ONE TABLET AT BEDTIME FOR 3  NIGHTS, THEN INCREASE TO TWO TABLETS AT BEDTIME. 180 tablet 1   rosuvastatin (CRESTOR) 20 MG tablet Take 20 mg by mouth at bedtime.      TRULICITY 1.5 MG/0.5ML SOPN      Venlafaxine HCl 225 MG TB24 Take 225 mg by mouth at bedtime.     No current facility-administered medications for this visit.    Medication Side Effects: None  Allergies:  Allergies  Allergen Reactions   Metoprolol Tartrate Other (See Comments)    Past Medical History:  Diagnosis Date   Arthritis    hips, knees, lower back   Asthma    rare inhaler use   Brain tumor (HCC)    x 2 - appear to be meningiomas, per neurosurgeon's report   Dental crown present    Diabetes (HCC)    Family history of adverse reaction to anesthesia    pt's father woke up during surgery; pt's mother has hx. of being hard to wake up post-op   GERD (gastroesophageal reflux disease)    OTC as needed   Graves' disease    no current med.   Heart murmur    as a child, states no problems   History of anemia    no problems since endometrial ablation   Hypertension    under control with meds., per pt.; has been on med. x 21 yr.   Insulin dependent diabetes mellitus    Trigger finger of right hand 10/2014   thumb and long finger    Past Medical History, Surgical history, Social history, and Family history were reviewed and updated as appropriate.   Please see review of systems for further details on the patient's review from today.   Objective:   Physical Exam:  LMP 09/30/2014 (Approximate)   Physical Exam Constitutional:      General: She is not in acute distress. Musculoskeletal:        General: No deformity.  Neurological:     Mental Status: She is alert and oriented to person, place, and time.     Coordination: Coordination normal.  Psychiatric:        Attention and Perception: Attention and perception normal. She does not perceive auditory or visual hallucinations.        Mood and Affect: Mood flat. Mood is anxious or  depressed. Affect  labile, blunt, angry or inappropriate.        Speech: Speech normal.          Thought Content: Thought content normal. Thought content is not paranoid or delusional. Thought content does not include homicidal or suicidal ideation. Thought content does not include homicidal or suicidal plan.        Cognition and Memory: Cognition and memory normal.  Judgment: Judgment normal.     Comments: Insight intact   Lab Review:     Component Value Date/Time   NA 140 04/07/2019 0914   K 4.1 04/07/2019 0914   CL 102 04/07/2019 0914   CO2 20 (L) 03/16/2019 1757   GLUCOSE 168 (H) 04/07/2019 0914   BUN 14 04/07/2019 0914   CREATININE 0.50 04/07/2019 0914   CREATININE 0.61 09/03/2012 1008   CALCIUM 8.6 (L) 03/16/2019 1757   GFRNONAA >60 03/16/2019 1757   GFRNONAA >60 09/03/2012 1008   GFRAA >60 03/16/2019 1757   GFRAA >60 09/03/2012 1008       Component Value Date/Time   WBC 12.9 (H) 03/18/2019 1123   RBC 3.12 (L) 03/18/2019 1123   HGB 14.6 04/07/2019 0914   HCT 43.0 04/07/2019 0914   PLT 320 03/18/2019 1123   MCV 86.5 03/18/2019 1123   MCH 28.2 03/18/2019 1123   MCHC 32.6 03/18/2019 1123   RDW 16.2 (H) 03/18/2019 1123   LYMPHSABS 2.5 03/18/2019 0302   MONOABS 1.4 (H) 03/18/2019 0302   EOSABS 0.3 03/18/2019 0302   BASOSABS 0.1 03/18/2019 0302    No results found for: "POCLITH", "LITHIUM"   No results found for: "PHENYTOIN", "PHENOBARB", "VALPROATE", "CBMZ"   .res Assessment: Plan:    Plan:  Effexor XR 225mg  every morning - PCP prescribing Xanax 0.25mg  ODT - prn panic attacks - taking daily Vraylar 1.5mg  daily for mood symptoms - samples given.   D/C Seroquel 50mg  at hs - will try taking 1.5 tablets at bedtime D/C Trazadone Add Hydroxyzine 50mg  1 to 2 at hs   Patient totally disabled and unable to work at this time. Out of work 12/21/2022 through 02/20/2023.  RTC 4 weeks.  Patient advised to contact office with any questions, adverse effects, or  acute worsening in signs and symptoms.  Discussed potential metabolic side effects associated with atypical antipsychotics, as well as potential risk for movement side effects. Advised pt to contact office if movement side effects occur.   Discussed potential benefits, risk, and side effects of benzodiazepines to include potential risk of tolerance and dependence, as well as possible drowsiness. Advised patient not to drive if experiencing drowsiness and to take lowest possible effective dose to minimize risk of dependence and tolerance. Diagnoses and all orders for this visit:  Major depressive disorder, recurrent episode, moderate (HCC)  Insomnia, unspecified type -     hydrOXYzine (ATARAX) 50 MG tablet; Take one to two tablets at bedtime for sleep.  Panic attacks  Generalized anxiety disorder     Please see After Visit Summary for patient specific instructions.  No future appointments.   No orders of the defined types were placed in this encounter.   -------------------------------

## 2023-01-14 NOTE — Addendum Note (Signed)
Addended by: Dorothyann Gibbs on: 01/14/2023 08:26 AM   Modules accepted: Level of Service

## 2023-01-15 ENCOUNTER — Ambulatory Visit: Payer: Self-pay | Admitting: Adult Health

## 2023-01-18 ENCOUNTER — Ambulatory Visit: Payer: Self-pay | Admitting: Adult Health

## 2023-02-05 ENCOUNTER — Other Ambulatory Visit: Payer: Self-pay | Admitting: Adult Health

## 2023-02-05 DIAGNOSIS — G4700 Insomnia, unspecified: Secondary | ICD-10-CM

## 2023-02-11 ENCOUNTER — Ambulatory Visit (INDEPENDENT_AMBULATORY_CARE_PROVIDER_SITE_OTHER): Payer: Self-pay | Admitting: Adult Health

## 2023-02-11 ENCOUNTER — Encounter: Payer: Self-pay | Admitting: Adult Health

## 2023-02-11 DIAGNOSIS — F410 Panic disorder [episodic paroxysmal anxiety] without agoraphobia: Secondary | ICD-10-CM

## 2023-02-11 DIAGNOSIS — G4700 Insomnia, unspecified: Secondary | ICD-10-CM

## 2023-02-11 DIAGNOSIS — F331 Major depressive disorder, recurrent, moderate: Secondary | ICD-10-CM

## 2023-02-11 DIAGNOSIS — F411 Generalized anxiety disorder: Secondary | ICD-10-CM

## 2023-02-11 NOTE — Progress Notes (Signed)
Karen Wu [id number] [birth year] 57 y.o.  Subjective:   Patient ID:  Karen Wu is a 57 y.o. (DOB 1966-06-06) female.  Chief Complaint: No chief complaint on file.   HPI Karen Wu presents to the office today for follow-up of MDD, GAD, insomnia,and panic attacks.  Describes mood today as "about the same". Pleasant. Flat. Reports tearfulness - tearful throughout interview. Mood symptoms - reports   depression "major". Reports increased anxiety - situational stressors - financial concerns. Reports irritability - "very". Reports worry, rumination, and over thinking - "more financial". Reports panic attacks - waking up in a panic. Stating "I'm not doing too good - things are creeping back in worse than they were the last time". Completing minimal tasks around the house - having to force herself to do things. Feels like the medications are helping, but "maybe not enough". Does not feel like she is able to return to work with current mental health and physical disabilities. Varying interest and motivation. Taking medications as prescribed.  Energy levels lower. Active, does not have a regular exercise routine - has not felt like walking lately. Enjoys some usual interests and activities. Married. Lives with husband of 34 years and 2 dogs. Has 3 children - one at home - 55, 51, and 20. Has 5 grandchildren.  Appetite adequate. Weight gain - 174 to 186 pounds. Was taking Trulicity, but can no longer afford it. Sleeping has improved. Averaging 7 hours with recent medication changes. Reports using CPAP machine.  Focus and concentration "difficulties - "still not good - not where it needs to be". Completing some tasks. Managing minimal aspects of household. Currently out of work. Has been out of work full time since 08/16/2019 and then worked part time until 05/19/2021. Does not feel like she is ready to return to work at this time. Denies SI or HI.  Denies AH or VH Denies self harm. Denies substance  use.    Previous medications: Paxil    PHQ2-9    Flowsheet Row Telephone from 05/30/2022 in [facility]  PHQ-2 Total Score 1        Review of Systems:  Review of Systems  Musculoskeletal:  Negative for gait problem.  Neurological:  Negative for tremors.  Psychiatric/Behavioral:         Please refer to HPI    Medications: I have reviewed the patient's current medications.  Current Outpatient Medications  Medication Sig Dispense Refill   Albuterol Sulfate (PROAIR RESPICLICK) 108 (90 Base) MCG/ACT AEPB Inhale 1-2 puffs into the lungs 3 (three) times daily as needed (wheezing/shortness of breath).      ALPRAZolam (XANAX) 0.25 MG tablet Take 1 tablet (0.25 mg total) by mouth daily. 30 tablet 2   amLODipine (NORVASC) 5 MG tablet Take 5 mg by mouth daily.     aspirin EC 81 MG tablet Take 81 mg by mouth daily.      BRILINTA 90 MG TABS tablet TAKE 1 TABLET BY MOUTH TWICE A DAY 60 tablet 3   empagliflozin (JARDIANCE) 25 MG TABS tablet Take 25 mg by mouth daily.      famotidine (PEPCID) 40 MG tablet Take 40 mg by mouth at bedtime.      fluconazole (DIFLUCAN) 150 MG tablet Take 150 mg by mouth daily.     gabapentin (NEURONTIN) 300 MG capsule Take 300 mg by mouth 2 (two) times daily.      HUMIRA PEN 40 MG/0.4ML PNKT Inject 40 mg into the  skin every 14 (fourteen) days. Fridays.     hydrochlorothiazide (HYDRODIURIL) 25 MG tablet Take 25 mg by mouth daily.     hydrocortisone (ANUSOL-HC) 25 MG suppository Place 25 mg rectally at bedtime.     hydrOXYzine (ATARAX) 50 MG tablet TAKE ONE TO TWO TABLETS AT BEDTIME FOR SLEEP. 180 tablet 0   irbesartan (AVAPRO) 150 MG tablet Take 150 mg by mouth daily.     LEVEMIR FLEXTOUCH 100 UNIT/ML Pen Inject 5-45 Units into the skin See admin instructions. Inject 5 units in the morning & 45 units at bedtime     metFORMIN (GLUCOPHAGE) 1000 MG tablet Take 1,000 mg by mouth 2 (two) times daily with a meal.     omeprazole  (PRILOSEC) 20 MG capsule Take 20 mg by mouth every morning.     oxybutynin (DITROPAN-XL) 10 MG 24 hr tablet Take 10 mg by mouth at bedtime.      PENNSAID 2 % SOLN Apply 1 application topically 2 (two) times daily as needed (hip pain.).     QUEtiapine (SEROQUEL) 100 MG tablet TAKE ONE TABLET AT BEDTIME FOR 3 NIGHTS, THEN INCREASE TO TWO TABLETS AT BEDTIME. 180 tablet 1   rosuvastatin (CRESTOR) 20 MG tablet Take 20 mg by mouth at bedtime.      TRULICITY 1.5 MG/0.5ML SOPN      Venlafaxine HCl 225 MG TB24 Take 225 mg by mouth at bedtime.     No current facility-administered medications for this visit.    Medication Side Effects: None  Allergies:  Allergies  Allergen Reactions   Metoprolol Tartrate Other (See Comments)    Past Medical History:  Diagnosis Date   Arthritis    hips, knees, lower back   Asthma    rare inhaler use   Brain tumor (HCC)    x 2 - appear to be meningiomas, per neurosurgeon's report   Dental crown present    Diabetes (HCC)    Family history of adverse reaction to anesthesia    pt's father woke up during surgery; pt's mother has hx. of being hard to wake up post-op   GERD (gastroesophageal reflux disease)    OTC as needed   Graves' disease    no current med.   Heart murmur    as a child, states no problems   History of anemia    no problems since endometrial ablation   Hypertension    under control with meds., per pt.; has been on med. x 21 yr.   Insulin dependent diabetes mellitus    Trigger finger of right hand 10/2014   thumb and long finger    Past Medical History, Surgical history, Social history, and Family history were reviewed and updated as appropriate.   Please see review of systems for further details on the patient's review from today.   Objective:   Physical Exam:  LMP 09/30/2014 (Approximate)   Physical Exam Constitutional:      General: She is not in acute distress. Musculoskeletal:        General: No deformity.  Neurological:      Mental Status: She is alert and oriented to person, place, and time.     Coordination: Coordination normal.  Psychiatric:        Attention and Perception: Attention and perception normal. She does not perceive auditory or visual hallucinations.        Mood and Affect: Mood normal. Mood is not anxious or depressed. Affect is not labile, blunt, angry or inappropriate.  Speech: Speech normal.        Behavior: Behavior normal.        Thought Content: Thought content normal. Thought content is not paranoid or delusional. Thought content does not include homicidal or suicidal ideation. Thought content does not include homicidal or suicidal plan.        Cognition and Memory: Cognition and memory normal.        Judgment: Judgment normal.     Comments: Insight intact     Lab Review:     Component Value Date/Time   NA 140 04/07/2019 0914   K 4.1 04/07/2019 0914   CL 102 04/07/2019 0914   CO2 20 (L) 03/16/2019 1757   GLUCOSE 168 (H) 04/07/2019 0914   BUN 14 04/07/2019 0914   CREATININE 0.50 04/07/2019 0914   CREATININE 0.61 09/03/2012 1008   CALCIUM 8.6 (L) 03/16/2019 1757   GFRNONAA >60 03/16/2019 1757   GFRNONAA >60 09/03/2012 1008   GFRAA >60 03/16/2019 1757   GFRAA >60 09/03/2012 1008       Component Value Date/Time   WBC 12.9 (H) 03/18/2019 1123   RBC 3.12 (L) 03/18/2019 1123   HGB 14.6 04/07/2019 0914   HCT 43.0 04/07/2019 0914   PLT 320 03/18/2019 1123   MCV 86.5 03/18/2019 1123   MCH 28.2 03/18/2019 1123   MCHC 32.6 03/18/2019 1123   RDW 16.2 (H) 03/18/2019 1123   LYMPHSABS 2.5 03/18/2019 0302   MONOABS 1.4 (H) 03/18/2019 0302   EOSABS 0.3 03/18/2019 0302   BASOSABS 0.1 03/18/2019 0302    No results found for: "POCLITH", "LITHIUM"   No results found for: "PHENYTOIN", "PHENOBARB", "VALPROATE", "CBMZ"   .res Assessment: Plan:    Plan:  Effexor XR 225mg  every morning - PCP prescribing Xanax 0.25mg  ODT - prn panic attacks - taking daily Vraylar 1.5mg   daily for mood symptoms - samples given - 4 weeks. Hydroxyzine 50mg  1 to 2 at hs   Patient totally disabled and unable to work at this time. Out of work 02/11/2023 through 04/14/2023  RTC 4 weeks.  Patient advised to contact office with any questions, adverse effects, or acute worsening in signs and symptoms.  Discussed potential metabolic side effects associated with atypical antipsychotics, as well as potential risk for movement side effects. Advised pt to contact office if movement side effects occur.   Discussed potential benefits, risk, and side effects of benzodiazepines to include potential risk of tolerance and dependence, as well as possible drowsiness. Advised patient not to drive if experiencing drowsiness and to take lowest possible effective dose to minimize risk of dependence and tolerance.  There are no diagnoses linked to this encounter.   Please see After Visit Summary for patient specific instructions.  No future appointments.  No orders of the defined types were placed in this encounter.   -------------------------------

## 2023-03-11 ENCOUNTER — Ambulatory Visit (INDEPENDENT_AMBULATORY_CARE_PROVIDER_SITE_OTHER): Payer: Self-pay | Admitting: Adult Health

## 2023-03-11 ENCOUNTER — Encounter: Payer: Self-pay | Admitting: Adult Health

## 2023-03-11 DIAGNOSIS — F411 Generalized anxiety disorder: Secondary | ICD-10-CM

## 2023-03-11 DIAGNOSIS — G4700 Insomnia, unspecified: Secondary | ICD-10-CM

## 2023-03-11 DIAGNOSIS — F410 Panic disorder [episodic paroxysmal anxiety] without agoraphobia: Secondary | ICD-10-CM

## 2023-03-11 DIAGNOSIS — F331 Major depressive disorder, recurrent, moderate: Secondary | ICD-10-CM

## 2023-03-11 MED ORDER — LAMOTRIGINE 25 MG PO TABS
ORAL_TABLET | ORAL | 2 refills | Status: DC
Start: 2023-03-11 — End: 2023-04-10

## 2023-03-11 NOTE — Progress Notes (Signed)
Karen Wu [id number] July 19, 1965 57 y.o.  Subjective:   Patient ID:  Karen Wu is a 57 y.o. (DOB [birth year]) female.  Chief Complaint: No chief complaint on file.   HPI Karen Wu presents to the office today for follow-up of MDD, GAD, insomnia,and panic attacks.  Describes mood today as "about the same". Pleasant. Flat. Reports tearfulness - "crying spells". Mood symptoms - reports depression, anxiety, and irritability. Reports recent panic attacks. Reports worry, rumination, and over thinking. Completing minimal tasks around the house. Stating "I don't feel like I'm doing any better". Feels like the medications are helping "some", but still having "bad days". Does not feel like she is able to return to work with current mental health and physical disabilities. Varying interest and motivation. Taking medications as prescribed.  Energy levels lower. Active, does not have a regular exercise routine - trying to walk some. Enjoys some usual interests and activities. Married. Lives with husband 2 dogs. Has 3 children - one at home - 39, 75, and 20. Has 5 grandchildren.  Appetite adequate. Weight gain - 12 pounds - 187 from 186 pounds. Was taking Trulicity, but unable to afford it. Sleep has improved. Averaging 6 to 9 hours with recent medication changes. Reports using CPAP machine.  Focus and concentration "difficulties". Reports forgetfulness. Reports getting lost driving recently. Completing some tasks.Managing minimal aspects of household. Currently out of work. Has been out of work full time since 08/16/2019 and then worked part time until 05/19/2021. Does not feel like she is ready to return to work at this time. Denies SI or HI.  Denies AH or VH Denies self harm. Denies substance use.    Previous medications: Paxil    PHQ2-9    Flowsheet Row Telephone from 05/30/2022 in [facility]  PHQ-2 Total Score 1        Review of Systems:   Review of Systems  Musculoskeletal:  Negative for gait problem.  Neurological:  Negative for tremors.  Psychiatric/Behavioral:         Please refer to HPI    Medications: I have reviewed the patient's current medications.  Current Outpatient Medications  Medication Sig Dispense Refill   lamoTRIgine (LAMICTAL) 25 MG tablet Take one tablet at bedtime for 14 days, then increase to two tablets at bedtime. 60 tablet 2   Albuterol Sulfate (PROAIR RESPICLICK) 108 (90 Base) MCG/ACT AEPB Inhale 1-2 puffs into the lungs 3 (three) times daily as needed (wheezing/shortness of breath).      ALPRAZolam (XANAX) 0.25 MG tablet Take 1 tablet (0.25 mg total) by mouth daily. 30 tablet 2   amLODipine (NORVASC) 5 MG tablet Take 5 mg by mouth daily.     aspirin EC 81 MG tablet Take 81 mg by mouth daily.      BRILINTA 90 MG TABS tablet TAKE 1 TABLET BY MOUTH TWICE A DAY 60 tablet 3   empagliflozin (JARDIANCE) 25 MG TABS tablet Take 25 mg by mouth daily.      famotidine (PEPCID) 40 MG tablet Take 40 mg by mouth at bedtime.      fluconazole (DIFLUCAN) 150 MG tablet Take 150 mg by mouth daily.     gabapentin (NEURONTIN) 300 MG capsule Take 300 mg by mouth 2 (two) times daily.      HUMIRA PEN 40 MG/0.4ML PNKT Inject 40 mg into the skin every 14 (fourteen) days. Fridays.     hydrochlorothiazide (HYDRODIURIL) 25 MG tablet Take 25 mg by mouth  daily.     hydrocortisone (ANUSOL-HC) 25 MG suppository Place 25 mg rectally at bedtime.     hydrOXYzine (ATARAX) 50 MG tablet TAKE ONE TO TWO TABLETS AT BEDTIME FOR SLEEP. 180 tablet 0   irbesartan (AVAPRO) 150 MG tablet Take 150 mg by mouth daily.     LEVEMIR FLEXTOUCH 100 UNIT/ML Pen Inject 5-45 Units into the skin See admin instructions. Inject 5 units in the morning & 45 units at bedtime     metFORMIN (GLUCOPHAGE) 1000 MG tablet Take 1,000 mg by mouth 2 (two) times daily with a meal.     omeprazole (PRILOSEC) 20 MG capsule Take 20 mg by mouth every morning.     oxybutynin  (DITROPAN-XL) 10 MG 24 hr tablet Take 10 mg by mouth at bedtime.      PENNSAID 2 % SOLN Apply 1 application topically 2 (two) times daily as needed (hip pain.).     rosuvastatin (CRESTOR) 20 MG tablet Take 20 mg by mouth at bedtime.      TRULICITY 1.5 MG/0.5ML SOPN      Venlafaxine HCl 225 MG TB24 Take 225 mg by mouth at bedtime.     No current facility-administered medications for this visit.    Medication Side Effects: None  Allergies:  Allergies  Allergen Reactions   Metoprolol Tartrate Other (See Comments)    Past Medical History:  Diagnosis Date   Arthritis    hips, knees, lower back   Asthma    rare inhaler use   Brain tumor (HCC)    x 2 - appear to be meningiomas, per neurosurgeon's report   Dental crown present    Diabetes (HCC)    Family history of adverse reaction to anesthesia    pt's father woke up during surgery; pt's mother has hx. of being hard to wake up post-op   GERD (gastroesophageal reflux disease)    OTC as needed   Graves' disease    no current med.   Heart murmur    as a child, states no problems   History of anemia    no problems since endometrial ablation   Hypertension    under control with meds., per pt.; has been on med. x 21 yr.   Insulin dependent diabetes mellitus    Trigger finger of right hand 10/2014   thumb and long finger    Past Medical History, Surgical history, Social history, and Family history were reviewed and updated as appropriate.   Please see review of systems for further details on the patient's review from today.   Objective:   Physical Exam:  LMP 09/30/2014 (Approximate)   Physical Exam Constitutional:      General: She is not in acute distress. Musculoskeletal:        General: No deformity.  Neurological:     Mental Status: She is alert and oriented to person, place, and time.     Coordination: Coordination normal.  Psychiatric:        Attention and Perception: Attention and perception normal. She does not  perceive auditory or visual hallucinations.        Mood and Affect: Affect is not labile, blunt, angry or inappropriate.        Speech: Speech normal.        Behavior: Behavior normal.        Thought Content: Thought content normal. Thought content is not paranoid or delusional. Thought content does not include homicidal or suicidal ideation. Thought content does not include homicidal  or suicidal plan.        Cognition and Memory: Cognition and memory normal.        Judgment: Judgment normal.     Comments: Insight intact     Lab Review:     Component Value Date/Time   NA 140 04/07/2019 0914   K 4.1 04/07/2019 0914   CL 102 04/07/2019 0914   CO2 20 (L) 03/16/2019 1757   GLUCOSE 168 (H) 04/07/2019 0914   BUN 14 04/07/2019 0914   CREATININE 0.50 04/07/2019 0914   CREATININE 0.61 09/03/2012 1008   CALCIUM 8.6 (L) 03/16/2019 1757   GFRNONAA >60 03/16/2019 1757   GFRNONAA >60 09/03/2012 1008   GFRAA >60 03/16/2019 1757   GFRAA >60 09/03/2012 1008       Component Value Date/Time   WBC 12.9 (H) 03/18/2019 1123   RBC 3.12 (L) 03/18/2019 1123   HGB 14.6 04/07/2019 0914   HCT 43.0 04/07/2019 0914   PLT 320 03/18/2019 1123   MCV 86.5 03/18/2019 1123   MCH 28.2 03/18/2019 1123   MCHC 32.6 03/18/2019 1123   RDW 16.2 (H) 03/18/2019 1123   LYMPHSABS 2.5 03/18/2019 0302   MONOABS 1.4 (H) 03/18/2019 0302   EOSABS 0.3 03/18/2019 0302   BASOSABS 0.1 03/18/2019 0302    No results found for: "POCLITH", "LITHIUM"   No results found for: "PHENYTOIN", "PHENOBARB", "VALPROATE", "CBMZ"   .res Assessment: Plan:    Plan:  Effexor XR 225mg  every morning - PCP prescribing  Xanax 0.25mg  ODT - prn panic attacks - taking daily Vraylar 1.5mg  daily for mood symptoms - samples given - 4 weeks. Hydroxyzine 50mg  1 to 2 at hs   Add Lamictal 25mg  at hs x 2 weeks, then increase to 50mg  at bedtime.  Patient totally disabled and unable to work at this time. Out of work 02/11/2023 through  04/14/2023  RTC 4 weeks.  Patient advised to contact office with any questions, adverse effects, or acute worsening in signs and symptoms.  Discussed potential metabolic side effects associated with atypical antipsychotics, as well as potential risk for movement side effects. Advised pt to contact office if movement side effects occur.   Discussed potential benefits, risk, and side effects of benzodiazepines to include potential risk of tolerance and dependence, as well as possible drowsiness. Advised patient not to drive if experiencing drowsiness and to take lowest possible effective dose to minimize risk of dependence and tolerance.  Diagnoses and all orders for this visit:  Major depressive disorder, recurrent episode, moderate (HCC) -     lamoTRIgine (LAMICTAL) 25 MG tablet; Take one tablet at bedtime for 14 days, then increase to two tablets at bedtime.  Generalized anxiety disorder  Panic attacks  Insomnia, unspecified type     Please see After Visit Summary for patient specific instructions.  No future appointments.   No orders of the defined types were placed in this encounter.   -------------------------------

## 2023-04-08 ENCOUNTER — Ambulatory Visit: Payer: Self-pay | Admitting: Adult Health

## 2023-04-08 NOTE — Progress Notes (Deleted)
Karen Wu [id number] 21-Oct-1965 57 y.o.  Subjective:   Patient ID:  Karen Wu is a 57 y.o. (DOB [birth year]) female.  Chief Complaint: No chief complaint on file.   HPI Karen Wu presents to the office today for follow-up of ***   PHQ2-9    Flowsheet Row Telephone from 05/30/2022 in [facility]  PHQ-2 Total Score 1        Review of Systems:  Review of Systems  Medications: {medication reviewed/display:3041432}  Current Outpatient Medications  Medication Sig Dispense Refill   Albuterol Sulfate (PROAIR RESPICLICK) 108 (90 Base) MCG/ACT AEPB Inhale 1-2 puffs into the lungs 3 (three) times daily as needed (wheezing/shortness of breath).      ALPRAZolam (XANAX) 0.25 MG tablet Take 1 tablet (0.25 mg total) by mouth daily. 30 tablet 2   amLODipine (NORVASC) 5 MG tablet Take 5 mg by mouth daily.     aspirin EC 81 MG tablet Take 81 mg by mouth daily.      BRILINTA 90 MG TABS tablet TAKE 1 TABLET BY MOUTH TWICE A DAY 60 tablet 3   empagliflozin (JARDIANCE) 25 MG TABS tablet Take 25 mg by mouth daily.      famotidine (PEPCID) 40 MG tablet Take 40 mg by mouth at bedtime.      fluconazole (DIFLUCAN) 150 MG tablet Take 150 mg by mouth daily.     gabapentin (NEURONTIN) 300 MG capsule Take 300 mg by mouth 2 (two) times daily.      HUMIRA PEN 40 MG/0.4ML PNKT Inject 40 mg into the skin every 14 (fourteen) days. Fridays.     hydrochlorothiazide (HYDRODIURIL) 25 MG tablet Take 25 mg by mouth daily.     hydrocortisone (ANUSOL-HC) 25 MG suppository Place 25 mg rectally at bedtime.     hydrOXYzine (ATARAX) 50 MG tablet TAKE ONE TO TWO TABLETS AT BEDTIME FOR SLEEP. 180 tablet 0   irbesartan (AVAPRO) 150 MG tablet Take 150 mg by mouth daily.     lamoTRIgine (LAMICTAL) 25 MG tablet Take one tablet at bedtime for 14 days, then increase to two tablets at bedtime. 60 tablet 2   LEVEMIR FLEXTOUCH 100 UNIT/ML Pen Inject 5-45 Units into the skin See admin  instructions. Inject 5 units in the morning & 45 units at bedtime     metFORMIN (GLUCOPHAGE) 1000 MG tablet Take 1,000 mg by mouth 2 (two) times daily with a meal.     omeprazole (PRILOSEC) 20 MG capsule Take 20 mg by mouth every morning.     oxybutynin (DITROPAN-XL) 10 MG 24 hr tablet Take 10 mg by mouth at bedtime.      PENNSAID 2 % SOLN Apply 1 application topically 2 (two) times daily as needed (hip pain.).     rosuvastatin (CRESTOR) 20 MG tablet Take 20 mg by mouth at bedtime.      TRULICITY 1.5 MG/0.5ML SOPN      Venlafaxine HCl 225 MG TB24 Take 225 mg by mouth at bedtime.     No current facility-administered medications for this visit.    Medication Side Effects: {Medication Side Effects (Optional):21014029}  Allergies:  Allergies  Allergen Reactions   Metoprolol Tartrate Other (See Comments)    Past Medical History:  Diagnosis Date   Arthritis    hips, knees, lower back   Asthma    rare inhaler use   Brain tumor (HCC)    x 2 - appear to be meningiomas, per neurosurgeon's report  Dental crown present    Diabetes [facility])    Family history of adverse reaction to anesthesia    pt's father woke up during surgery; pt's mother has hx. of being hard to wake up post-op   GERD (gastroesophageal reflux disease)    OTC as needed   Graves' disease    no current med.   Heart murmur    as a child, states no problems   History of anemia    no problems since endometrial ablation   Hypertension    under control with meds., per pt.; has been on med. x 21 yr.   Insulin dependent diabetes mellitus    Trigger finger of right hand 10/2014   thumb and long finger    Past Medical History, Surgical history, Social history, and Family history were reviewed and updated as appropriate.   Please see review of systems for further details on the patient's review from today.   Objective:   Physical Exam:  LMP 09/30/2014 (Approximate)   Physical Exam  Lab Review:     Component Value  Date/Time   NA 140 04/07/2019 0914   K 4.1 04/07/2019 0914   CL 102 04/07/2019 0914   CO2 20 (L) 03/16/2019 1757   GLUCOSE 168 (H) 04/07/2019 0914   BUN 14 04/07/2019 0914   CREATININE 0.50 04/07/2019 0914   CREATININE 0.61 09/03/2012 1008   CALCIUM 8.6 (L) 03/16/2019 1757   GFRNONAA >60 03/16/2019 1757   GFRNONAA >60 09/03/2012 1008   GFRAA >60 03/16/2019 1757   GFRAA >60 09/03/2012 1008       Component Value Date/Time   WBC 12.9 (H) 03/18/2019 1123   RBC 3.12 (L) 03/18/2019 1123   HGB 14.6 04/07/2019 0914   HCT 43.0 04/07/2019 0914   PLT 320 03/18/2019 1123   MCV 86.5 03/18/2019 1123   MCH 28.2 03/18/2019 1123   MCHC 32.6 03/18/2019 1123   RDW 16.2 (H) 03/18/2019 1123   LYMPHSABS 2.5 03/18/2019 0302   MONOABS 1.4 (H) 03/18/2019 0302   EOSABS 0.3 03/18/2019 0302   BASOSABS 0.1 03/18/2019 0302    No results found for: "POCLITH", "LITHIUM"   No results found for: "PHENYTOIN", "PHENOBARB", "VALPROATE", "CBMZ"   .res Assessment: Plan:    There are no diagnoses linked to this encounter.   Please see After Visit Summary for patient specific instructions.  Future Appointments  Date Time Provider Department Center  04/08/2023  9:00 AM Lissie Hinesley, Thereasa Solo, NP [facility] None    No orders of the defined types were placed in this encounter.   -------------------------------

## 2023-04-09 NOTE — Progress Notes (Signed)
Patient no show appointment. ? ?

## 2023-04-10 ENCOUNTER — Ambulatory Visit (INDEPENDENT_AMBULATORY_CARE_PROVIDER_SITE_OTHER): Payer: Self-pay | Admitting: Adult Health

## 2023-04-10 ENCOUNTER — Encounter: Payer: Self-pay | Admitting: Adult Health

## 2023-04-10 DIAGNOSIS — F410 Panic disorder [episodic paroxysmal anxiety] without agoraphobia: Secondary | ICD-10-CM

## 2023-04-10 DIAGNOSIS — G4700 Insomnia, unspecified: Secondary | ICD-10-CM

## 2023-04-10 DIAGNOSIS — F331 Major depressive disorder, recurrent, moderate: Secondary | ICD-10-CM

## 2023-04-10 DIAGNOSIS — F411 Generalized anxiety disorder: Secondary | ICD-10-CM

## 2023-04-10 MED ORDER — ALPRAZOLAM 0.25 MG PO TABS
0.2500 mg | ORAL_TABLET | Freq: Every day | ORAL | 2 refills | Status: DC
Start: 2023-04-10 — End: 2023-06-27

## 2023-04-10 MED ORDER — LAMOTRIGINE 100 MG PO TABS
ORAL_TABLET | ORAL | 2 refills | Status: DC
Start: 2023-04-10 — End: 2023-06-27

## 2023-04-10 MED ORDER — HYDROXYZINE HCL 50 MG PO TABS
ORAL_TABLET | ORAL | 0 refills | Status: DC
Start: 2023-04-10 — End: 2023-07-12

## 2023-04-10 NOTE — Progress Notes (Signed)
ROSSY GASPARI [id number] 02-17-1966 57 y.o.  Subjective:   Patient ID:  MARYLOU PASKE is a 57 y.o. (DOB [birth year]) female.  Chief Complaint: No chief complaint on file.   HPI Pricsilla TERALYN PARKHURST presents to the office today for follow-up of MDD, GAD, insomnia,and panic attacks.  Describes mood today as "not too good this morning". Pleasant. Flat. Reports tearfulness - "I'm still having crying spells". Mood symptoms - reports depression, anxiety, and irritability. Reports recent panic attacks. Reports worry, rumination and over thinking. Mood is lower. Stating "I don't feel like I'm doing much better". Reports tolerating the addition of Lamictal and feels it be helping "a little bit" - she is willing to increase the dose. Completing minimal tasks around the house. Does not feel like she is able to return to work with current mental health and physical disabilities. Varying interest and motivation. Taking medications as prescribed.  Energy levels lower. Active, does not have a regular exercise routine. Enjoys some usual interests and activities. Married. Lives with husband 2 dogs. Has 3 children - one at home - 63, 80, and 20. Has 5 grandchildren.  Appetite adequate. Weight gain - 187 to 192 pounds. Was taking Trulicity, but unable to afford it. Sleep has improved - "last night was 5 hours". Averaging 6 to 8 hours with recent medication changes. Reports using CPAP machine.  Focus and concentration "difficulties". Reports forgetfulness. Completing some tasks. Managing minimal aspects of household. Currently out of work. Has been out of work full time since 08/16/2019 and then worked part time until 05/19/2021. Does not feel like she is ready to return to work at this time. Denies SI or HI.  Denies AH or VH Denies self harm. Denies substance use.    Previous medications: Paxil    PHQ2-9    Flowsheet Row Telephone from 05/30/2022 in [facility]  PHQ-2 Total  Score 1        Review of Systems:  Review of Systems  Musculoskeletal:  Negative for gait problem.  Neurological:  Negative for tremors.  Psychiatric/Behavioral:         Please refer to HPI    Medications: I have reviewed the patient's current medications.  Current Outpatient Medications  Medication Sig Dispense Refill   Albuterol Sulfate (PROAIR RESPICLICK) 108 (90 Base) MCG/ACT AEPB Inhale 1-2 puffs into the lungs 3 (three) times daily as needed (wheezing/shortness of breath).      ALPRAZolam (XANAX) 0.25 MG tablet Take 1 tablet (0.25 mg total) by mouth daily. 30 tablet 2   amLODipine (NORVASC) 5 MG tablet Take 5 mg by mouth daily.     aspirin EC 81 MG tablet Take 81 mg by mouth daily.      BRILINTA 90 MG TABS tablet TAKE 1 TABLET BY MOUTH TWICE A DAY 60 tablet 3   empagliflozin (JARDIANCE) 25 MG TABS tablet Take 25 mg by mouth daily.      famotidine (PEPCID) 40 MG tablet Take 40 mg by mouth at bedtime.      fluconazole (DIFLUCAN) 150 MG tablet Take 150 mg by mouth daily.     gabapentin (NEURONTIN) 300 MG capsule Take 300 mg by mouth 2 (two) times daily.      HUMIRA PEN 40 MG/0.4ML PNKT Inject 40 mg into the skin every 14 (fourteen) days. Fridays.     hydrochlorothiazide (HYDRODIURIL) 25 MG tablet Take 25 mg by mouth daily.     hydrocortisone (ANUSOL-HC) 25 MG suppository Place 25 mg  rectally at bedtime.     hydrOXYzine (ATARAX) 50 MG tablet TAKE ONE TO TWO TABLETS AT BEDTIME FOR SLEEP. 180 tablet 0   irbesartan (AVAPRO) 150 MG tablet Take 150 mg by mouth daily.     lamoTRIgine (LAMICTAL) 25 MG tablet Take one tablet at bedtime for 14 days, then increase to two tablets at bedtime. 60 tablet 2   LEVEMIR FLEXTOUCH 100 UNIT/ML Pen Inject 5-45 Units into the skin See admin instructions. Inject 5 units in the morning & 45 units at bedtime     metFORMIN (GLUCOPHAGE) 1000 MG tablet Take 1,000 mg by mouth 2 (two) times daily with a meal.     omeprazole (PRILOSEC) 20 MG capsule Take 20 mg  by mouth every morning.     oxybutynin (DITROPAN-XL) 10 MG 24 hr tablet Take 10 mg by mouth at bedtime.      PENNSAID 2 % SOLN Apply 1 application topically 2 (two) times daily as needed (hip pain.).     rosuvastatin (CRESTOR) 20 MG tablet Take 20 mg by mouth at bedtime.      TRULICITY 1.5 MG/0.5ML SOPN      Venlafaxine HCl 225 MG TB24 Take 225 mg by mouth at bedtime.     No current facility-administered medications for this visit.    Medication Side Effects: None  Allergies:  Allergies  Allergen Reactions   Metoprolol Tartrate Other (See Comments)    Past Medical History:  Diagnosis Date   Arthritis    hips, knees, lower back   Asthma    rare inhaler use   Brain tumor (HCC)    x 2 - appear to be meningiomas, per neurosurgeon's report   Dental crown present    Diabetes (HCC)    Family history of adverse reaction to anesthesia    pt's father woke up during surgery; pt's mother has hx. of being hard to wake up post-op   GERD (gastroesophageal reflux disease)    OTC as needed   Graves' disease    no current med.   Heart murmur    as a child, states no problems   History of anemia    no problems since endometrial ablation   Hypertension    under control with meds., per pt.; has been on med. x 21 yr.   Insulin dependent diabetes mellitus    Trigger finger of right hand 10/2014   thumb and long finger    Past Medical History, Surgical history, Social history, and Family history were reviewed and updated as appropriate.   Please see review of systems for further details on the patient's review from today.   Objective:   Physical Exam:  LMP 09/30/2014 (Approximate)   Physical Exam Constitutional:      General: She is not in acute distress. Musculoskeletal:        General: No deformity.  Neurological:     Mental Status: She is alert and oriented to person, place, and time.     Coordination: Coordination normal.  Psychiatric:        Attention and Perception:  Attention and perception normal. She does not perceive auditory or visual hallucinations.        Mood and Affect: Affect is not labile, blunt, angry or inappropriate.        Speech: Speech normal.        Behavior: Behavior normal.        Thought Content: Thought content normal. Thought content is not paranoid or delusional. Thought content does  not include homicidal or suicidal ideation. Thought content does not include homicidal or suicidal plan.        Cognition and Memory: Cognition and memory normal.        Judgment: Judgment normal.     Comments: Insight intact     Lab Review:     Component Value Date/Time   NA 140 04/07/2019 0914   K 4.1 04/07/2019 0914   CL 102 04/07/2019 0914   CO2 20 (L) 03/16/2019 1757   GLUCOSE 168 (H) 04/07/2019 0914   BUN 14 04/07/2019 0914   CREATININE 0.50 04/07/2019 0914   CREATININE 0.61 09/03/2012 1008   CALCIUM 8.6 (L) 03/16/2019 1757   GFRNONAA >60 03/16/2019 1757   GFRNONAA >60 09/03/2012 1008   GFRAA >60 03/16/2019 1757   GFRAA >60 09/03/2012 1008       Component Value Date/Time   WBC 12.9 (H) 03/18/2019 1123   RBC 3.12 (L) 03/18/2019 1123   HGB 14.6 04/07/2019 0914   HCT 43.0 04/07/2019 0914   PLT 320 03/18/2019 1123   MCV 86.5 03/18/2019 1123   MCH 28.2 03/18/2019 1123   MCHC 32.6 03/18/2019 1123   RDW 16.2 (H) 03/18/2019 1123   LYMPHSABS 2.5 03/18/2019 0302   MONOABS 1.4 (H) 03/18/2019 0302   EOSABS 0.3 03/18/2019 0302   BASOSABS 0.1 03/18/2019 0302    No results found for: "POCLITH", "LITHIUM"   No results found for: "PHENYTOIN", "PHENOBARB", "VALPROATE", "CBMZ"   .res Assessment: Plan:    Plan:  Effexor XR 225mg  every morning - PCP prescribing Xanax 0.25mg  ODT - prn panic attacks - taking daily Vraylar 1.5mg  daily for mood symptoms - samples given - 4 weeks. Hydroxyzine 50mg  1 to 2 at hs   Increase Lamictal 50mg  to 100mg  at bedtime.  Patient totally disabled and unable to work at this time. Out of work  04/10/2023 through 06/10/2023  RTC 4 weeks.  Patient advised to contact office with any questions, adverse effects, or acute worsening in signs and symptoms.  Discussed potential metabolic side effects associated with atypical antipsychotics, as well as potential risk for movement side effects. Advised pt to contact office if movement side effects occur.   Discussed potential benefits, risk, and side effects of benzodiazepines to include potential risk of tolerance and dependence, as well as possible drowsiness. Advised patient not to drive if experiencing drowsiness and to take lowest possible effective dose to minimize risk of dependence and tolerance.  There are no diagnoses linked to this encounter.   Please see After Visit Summary for patient specific instructions.  No future appointments.  No orders of the defined types were placed in this encounter.   -------------------------------

## 2023-05-08 ENCOUNTER — Ambulatory Visit (INDEPENDENT_AMBULATORY_CARE_PROVIDER_SITE_OTHER): Payer: Self-pay | Admitting: Adult Health

## 2023-05-08 DIAGNOSIS — Z0389 Encounter for observation for other suspected diseases and conditions ruled out: Secondary | ICD-10-CM

## 2023-05-08 NOTE — Progress Notes (Signed)
Patient no show appointment. ? ?

## 2023-05-09 ENCOUNTER — Ambulatory Visit (INDEPENDENT_AMBULATORY_CARE_PROVIDER_SITE_OTHER): Payer: Self-pay | Admitting: Adult Health

## 2023-05-09 ENCOUNTER — Encounter: Payer: Self-pay | Admitting: Adult Health

## 2023-05-09 DIAGNOSIS — F331 Major depressive disorder, recurrent, moderate: Secondary | ICD-10-CM

## 2023-05-09 DIAGNOSIS — F410 Panic disorder [episodic paroxysmal anxiety] without agoraphobia: Secondary | ICD-10-CM

## 2023-05-09 DIAGNOSIS — G4700 Insomnia, unspecified: Secondary | ICD-10-CM

## 2023-05-09 DIAGNOSIS — F411 Generalized anxiety disorder: Secondary | ICD-10-CM

## 2023-05-09 NOTE — Progress Notes (Signed)
[name] [id number] November 01, 1965 57 y.o.  Subjective:   Patient ID:  Karen Wu is a 57 y.o. (DOB [birth year]) female.  Chief Complaint: No chief complaint on file.   HPI Myli LETHER DAMRON presents to the office today for follow-up of MDD, GAD, insomnia,and panic attacks.  Describes mood today as "so-so". Pleasant. Flat. Reports tearfulness - "crying spells". Mood symptoms - reports depression, anxiety, and irritability. Reports recent panic attacks. Reports worry, rumination and over thinking. Mood is lower. Stating "I feel like the medications are helping some". Reports tolerating the increase in Lamictal and feels it has been helpful - willing to consider other options. Reporting arthritic flare ups effecting hips and knees. Does not feel like she is able to return to work with current mental health and physical disabilities. Varying interest and motivation. Taking medications as prescribed.  Energy levels lower. Active, does not have a regular exercise routine. Enjoys some usual interests and activities. Married. Lives with husband 2 dogs. Has 3 children - one at home - 50, 28, and 20. Has 5 grandchildren.  Appetite adequate. Weight gain - 192 to 200 pounds.  Sleep has improved. Averaging 6 to 8 hours. Reports using CPAP machine.  Reports focus and concentration "difficulties". Reports forgetfulness. Completing some tasks. Managing minimal aspects of household. Currently out of work. Has been out of work full time since 08/16/2019 and then worked part time until 05/19/2021. Does not feel like she is ready to return to work at this time. Denies SI or HI.  Denies AH or VH Denies self harm. Denies substance use.    Previous medications: Paxil    PHQ2-9    Flowsheet Row Telephone from 05/30/2022 in [facility]  PHQ-2 Total Score 1        Review of Systems:  Review of Systems  Musculoskeletal:  Negative for gait problem.  Neurological:   Negative for tremors.  Psychiatric/Behavioral:         Please refer to HPI    Medications: I have reviewed the patient's current medications.  Current Outpatient Medications  Medication Sig Dispense Refill   Albuterol Sulfate (PROAIR RESPICLICK) 108 (90 Base) MCG/ACT AEPB Inhale 1-2 puffs into the lungs 3 (three) times daily as needed (wheezing/shortness of breath).      ALPRAZolam (XANAX) 0.25 MG tablet Take 1 tablet (0.25 mg total) by mouth daily. 30 tablet 2   amLODipine (NORVASC) 5 MG tablet Take 5 mg by mouth daily.     aspirin EC 81 MG tablet Take 81 mg by mouth daily.      BRILINTA 90 MG TABS tablet TAKE 1 TABLET BY MOUTH TWICE A DAY 60 tablet 3   empagliflozin (JARDIANCE) 25 MG TABS tablet Take 25 mg by mouth daily.      famotidine (PEPCID) 40 MG tablet Take 40 mg by mouth at bedtime.      fluconazole (DIFLUCAN) 150 MG tablet Take 150 mg by mouth daily.     gabapentin (NEURONTIN) 300 MG capsule Take 300 mg by mouth 2 (two) times daily.      HUMIRA PEN 40 MG/0.4ML PNKT Inject 40 mg into the skin every 14 (fourteen) days. Fridays.     hydrochlorothiazide (HYDRODIURIL) 25 MG tablet Take 25 mg by mouth daily.     hydrocortisone (ANUSOL-HC) 25 MG suppository Place 25 mg rectally at bedtime.     hydrOXYzine (ATARAX) 50 MG tablet Take one to two tablets at bedtime for sleep. 180 tablet 0  irbesartan (AVAPRO) 150 MG tablet Take 150 mg by mouth daily.     lamoTRIgine (LAMICTAL) 100 MG tablet Take one tablet at bedtime. 30 tablet 2   LEVEMIR FLEXTOUCH 100 UNIT/ML Pen Inject 5-45 Units into the skin See admin instructions. Inject 5 units in the morning & 45 units at bedtime     metFORMIN (GLUCOPHAGE) 1000 MG tablet Take 1,000 mg by mouth 2 (two) times daily with a meal.     omeprazole (PRILOSEC) 20 MG capsule Take 20 mg by mouth every morning.     oxybutynin (DITROPAN-XL) 10 MG 24 hr tablet Take 10 mg by mouth at bedtime.      PENNSAID 2 % SOLN Apply 1 application topically 2 (two) times  daily as needed (hip pain.).     rosuvastatin (CRESTOR) 20 MG tablet Take 20 mg by mouth at bedtime.      TRULICITY 1.5 MG/0.5ML SOPN      Venlafaxine HCl 225 MG TB24 Take 225 mg by mouth at bedtime.     No current facility-administered medications for this visit.    Medication Side Effects: None  Allergies:  Allergies  Allergen Reactions   Metoprolol Tartrate Other (See Comments)    Past Medical History:  Diagnosis Date   Arthritis    hips, knees, lower back   Asthma    rare inhaler use   Brain tumor (HCC)    x 2 - appear to be meningiomas, per neurosurgeon's report   Dental crown present    Diabetes (HCC)    Family history of adverse reaction to anesthesia    pt's father woke up during surgery; pt's mother has hx. of being hard to wake up post-op   GERD (gastroesophageal reflux disease)    OTC as needed   Graves' disease    no current med.   Heart murmur    as a child, states no problems   History of anemia    no problems since endometrial ablation   Hypertension    under control with meds., per pt.; has been on med. x 21 yr.   Insulin dependent diabetes mellitus    Trigger finger of right hand 10/2014   thumb and long finger    Past Medical History, Surgical history, Social history, and Family history were reviewed and updated as appropriate.   Please see review of systems for further details on the patient's review from today.   Objective:   Physical Exam:  LMP 09/30/2014 (Approximate)   Physical Exam Constitutional:      General: She is not in acute distress. Musculoskeletal:        General: No deformity.  Neurological:     Mental Status: She is alert and oriented to person, place, and time.     Coordination: Coordination normal.  Psychiatric:        Attention and Perception: Attention and perception normal. She does not perceive auditory or visual hallucinations.        Mood and Affect: Mood normal. Mood is not anxious or depressed. Affect is not  labile, blunt, angry or inappropriate.        Speech: Speech normal.        Behavior: Behavior normal.        Thought Content: Thought content normal. Thought content is not paranoid or delusional. Thought content does not include homicidal or suicidal ideation. Thought content does not include homicidal or suicidal plan.        Cognition and Memory: Cognition and memory  normal.        Judgment: Judgment normal.     Comments: Insight intact     Lab Review:     Component Value Date/Time   NA 140 04/07/2019 0914   K 4.1 04/07/2019 0914   CL 102 04/07/2019 0914   CO2 20 (L) 03/16/2019 1757   GLUCOSE 168 (H) 04/07/2019 0914   BUN 14 04/07/2019 0914   CREATININE 0.50 04/07/2019 0914   CREATININE 0.61 09/03/2012 1008   CALCIUM 8.6 (L) 03/16/2019 1757   GFRNONAA >60 03/16/2019 1757   GFRNONAA >60 09/03/2012 1008   GFRAA >60 03/16/2019 1757   GFRAA >60 09/03/2012 1008       Component Value Date/Time   WBC 12.9 (H) 03/18/2019 1123   RBC 3.12 (L) 03/18/2019 1123   HGB 14.6 04/07/2019 0914   HCT 43.0 04/07/2019 0914   PLT 320 03/18/2019 1123   MCV 86.5 03/18/2019 1123   MCH 28.2 03/18/2019 1123   MCHC 32.6 03/18/2019 1123   RDW 16.2 (H) 03/18/2019 1123   LYMPHSABS 2.5 03/18/2019 0302   MONOABS 1.4 (H) 03/18/2019 0302   EOSABS 0.3 03/18/2019 0302   BASOSABS 0.1 03/18/2019 0302    No results found for: "POCLITH", "LITHIUM"   No results found for: "PHENYTOIN", "PHENOBARB", "VALPROATE", "CBMZ"   .res Assessment: Plan:    Plan:  Effexor XR 225mg  every morning - PCP prescribing Xanax 0.25mg  ODT - prn panic attacks - taking daily Vraylar 1.5mg  daily for mood symptoms - samples given - 4 weeks. Hydroxyzine 50mg  1 to 2 at hs   Lamictal 100mg  at bedtime.  Patient totally disabled and unable to work at this time. Out of work 04/10/2023 through 06/10/2023  RTC 4 weeks.  Patient advised to contact office with any questions, adverse effects, or acute worsening in signs and  symptoms.  Discussed potential metabolic side effects associated with atypical antipsychotics, as well as potential risk for movement side effects. Advised pt to contact office if movement side effects occur.   Discussed potential benefits, risk, and side effects of benzodiazepines to include potential risk of tolerance and dependence, as well as possible drowsiness. Advised patient not to drive if experiencing drowsiness and to take lowest possible effective dose to minimize risk of dependence and tolerance. There are no diagnoses linked to this encounter.   Please see After Visit Summary for patient specific instructions.  Future Appointments  Date Time Provider Department Center  05/09/2023  2:00 PM Denali Sharma, Thereasa Solo, NP [facility] None    No orders of the defined types were placed in this encounter.   -------------------------------

## 2023-06-06 ENCOUNTER — Encounter: Payer: Self-pay | Admitting: Adult Health

## 2023-06-06 ENCOUNTER — Ambulatory Visit (INDEPENDENT_AMBULATORY_CARE_PROVIDER_SITE_OTHER): Payer: Self-pay | Admitting: Adult Health

## 2023-06-06 DIAGNOSIS — F410 Panic disorder [episodic paroxysmal anxiety] without agoraphobia: Secondary | ICD-10-CM

## 2023-06-06 DIAGNOSIS — G4700 Insomnia, unspecified: Secondary | ICD-10-CM

## 2023-06-06 DIAGNOSIS — F331 Major depressive disorder, recurrent, moderate: Secondary | ICD-10-CM

## 2023-06-06 DIAGNOSIS — F411 Generalized anxiety disorder: Secondary | ICD-10-CM

## 2023-06-06 NOTE — Progress Notes (Signed)
[name] [id number] 31-Jan-1966 57 y.o.  Subjective:   Patient ID:  Karen Wu is a 57 y.o. (DOB [birth year]) female.  Chief Complaint: No chief complaint on file.   HPI Karen Wu presents to the office today for follow-up of MDD, GAD, insomnia,and panic attacks.  Describes mood today as "not goo". Pleasant. Flat. Reports tearfulness - "crying spells". Mood symptoms - reports depression, anxiety, and irritability - "all I want to do is sleep". Reports panic attacks. Reports worry, rumination and over thinking. Reports increased situational stressors with being out of work - unable to afford the things she needs. Reports she is unable to afford insurance to get the care she needs. Mood is lower. Stating "I haven't been feeling good". Reports current medications are helpful. Does not feel like she is able to return to work with current mental health and physical disabilities. Varying interest and motivation. Taking medications as prescribed.  Energy levels lower. Active, does not have a regular exercise routine. Enjoys some usual interests and activities. Married. Lives with husband 2 dogs. Has 3 children - one at home - 51, 57, and 20. Has 5 grandchildren.  Appetite adequate. Weight stable - 200 pounds.  Sleep has improved. Averaging 6 to 8 hours. Reports using CPAP machine.  Reports focus and concentration "difficulties". Reports forgetfulness. Completing some tasks. Managing minimal aspects of household. Currently out of work. Has been out of work full time since 08/16/2019 and then worked part time until 05/19/2021. Does not feel like she is ready to return to work at this time. Denies SI or HI.  Denies AH or VH Denies self harm. Denies substance use.    Previous medications: Paxil   PHQ2-9    Flowsheet Row Telephone from 05/30/2022 in [facility]  PHQ-2 Total Score 1        Review of Systems:  Review of Systems  Musculoskeletal:   Negative for gait problem.  Neurological:  Negative for tremors.  Psychiatric/Behavioral:         Please refer to HPI    Medications: I have reviewed the patient's current medications.  Current Outpatient Medications  Medication Sig Dispense Refill   Albuterol Sulfate (PROAIR RESPICLICK) 108 (90 Base) MCG/ACT AEPB Inhale 1-2 puffs into the lungs 3 (three) times daily as needed (wheezing/shortness of breath).      ALPRAZolam (XANAX) 0.25 MG tablet Take 1 tablet (0.25 mg total) by mouth daily. 30 tablet 2   amLODipine (NORVASC) 5 MG tablet Take 5 mg by mouth daily.     aspirin EC 81 MG tablet Take 81 mg by mouth daily.      BRILINTA 90 MG TABS tablet TAKE 1 TABLET BY MOUTH TWICE A DAY 60 tablet 3   empagliflozin (JARDIANCE) 25 MG TABS tablet Take 25 mg by mouth daily.      famotidine (PEPCID) 40 MG tablet Take 40 mg by mouth at bedtime.      fluconazole (DIFLUCAN) 150 MG tablet Take 150 mg by mouth daily.     gabapentin (NEURONTIN) 300 MG capsule Take 300 mg by mouth 2 (two) times daily.      HUMIRA PEN 40 MG/0.4ML PNKT Inject 40 mg into the skin every 14 (fourteen) days. Fridays.     hydrochlorothiazide (HYDRODIURIL) 25 MG tablet Take 25 mg by mouth daily.     hydrocortisone (ANUSOL-HC) 25 MG suppository Place 25 mg rectally at bedtime.     hydrOXYzine (ATARAX) 50 MG tablet Take one  to two tablets at bedtime for sleep. 180 tablet 0   irbesartan (AVAPRO) 150 MG tablet Take 150 mg by mouth daily.     lamoTRIgine (LAMICTAL) 100 MG tablet Take one tablet at bedtime. 30 tablet 2   LEVEMIR FLEXTOUCH 100 UNIT/ML Pen Inject 5-45 Units into the skin See admin instructions. Inject 5 units in the morning & 45 units at bedtime     metFORMIN (GLUCOPHAGE) 1000 MG tablet Take 1,000 mg by mouth 2 (two) times daily with a meal.     omeprazole (PRILOSEC) 20 MG capsule Take 20 mg by mouth every morning.     oxybutynin (DITROPAN-XL) 10 MG 24 hr tablet Take 10 mg by mouth at bedtime.      PENNSAID 2 % SOLN  Apply 1 application topically 2 (two) times daily as needed (hip pain.).     rosuvastatin (CRESTOR) 20 MG tablet Take 20 mg by mouth at bedtime.      TRULICITY 1.5 MG/0.5ML SOPN      Venlafaxine HCl 225 MG TB24 Take 225 mg by mouth at bedtime.     No current facility-administered medications for this visit.    Medication Side Effects: None  Allergies:  Allergies  Allergen Reactions   Metoprolol Tartrate Other (See Comments)    Past Medical History:  Diagnosis Date   Arthritis    hips, knees, lower back   Asthma    rare inhaler use   Brain tumor (HCC)    x 2 - appear to be meningiomas, per neurosurgeon's report   Dental crown present    Diabetes (HCC)    Family history of adverse reaction to anesthesia    pt's father woke up during surgery; pt's mother has hx. of being hard to wake up post-op   GERD (gastroesophageal reflux disease)    OTC as needed   Graves' disease    no current med.   Heart murmur    as a child, states no problems   History of anemia    no problems since endometrial ablation   Hypertension    under control with meds., per pt.; has been on med. x 21 yr.   Insulin dependent diabetes mellitus    Trigger finger of right hand 10/2014   thumb and long finger    Past Medical History, Surgical history, Social history, and Family history were reviewed and updated as appropriate.   Please see review of systems for further details on the patient's review from today.   Objective:   Physical Exam:  LMP 09/30/2014 (Approximate)   Physical Exam Constitutional:      General: She is not in acute distress. Musculoskeletal:        General: No deformity.  Neurological:     Mental Status: She is alert and oriented to person, place, and time.     Coordination: Coordination normal.  Psychiatric:        Attention and Perception: Attention and perception normal. She does not perceive auditory or visual hallucinations.        Mood and Affect: Affect is not  labile, blunt, angry or inappropriate.        Speech: Speech normal.        Behavior: Behavior normal.        Thought Content: Thought content normal. Thought content is not paranoid or delusional. Thought content does not include homicidal or suicidal ideation. Thought content does not include homicidal or suicidal plan.        Cognition and  Memory: Cognition and memory normal.        Judgment: Judgment normal.     Comments: Insight intact     Lab Review:     Component Value Date/Time   NA 140 04/07/2019 0914   K 4.1 04/07/2019 0914   CL 102 04/07/2019 0914   CO2 20 (L) 03/16/2019 1757   GLUCOSE 168 (H) 04/07/2019 0914   BUN 14 04/07/2019 0914   CREATININE 0.50 04/07/2019 0914   CREATININE 0.61 09/03/2012 1008   CALCIUM 8.6 (L) 03/16/2019 1757   GFRNONAA >60 03/16/2019 1757   GFRNONAA >60 09/03/2012 1008   GFRAA >60 03/16/2019 1757   GFRAA >60 09/03/2012 1008       Component Value Date/Time   WBC 12.9 (H) 03/18/2019 1123   RBC 3.12 (L) 03/18/2019 1123   HGB 14.6 04/07/2019 0914   HCT 43.0 04/07/2019 0914   PLT 320 03/18/2019 1123   MCV 86.5 03/18/2019 1123   MCH 28.2 03/18/2019 1123   MCHC 32.6 03/18/2019 1123   RDW 16.2 (H) 03/18/2019 1123   LYMPHSABS 2.5 03/18/2019 0302   MONOABS 1.4 (H) 03/18/2019 0302   EOSABS 0.3 03/18/2019 0302   BASOSABS 0.1 03/18/2019 0302    No results found for: "POCLITH", "LITHIUM"   No results found for: "PHENYTOIN", "PHENOBARB", "VALPROATE", "CBMZ"   .res Assessment: Plan:    Plan:  Effexor XR 225mg  every morning - PCP prescribing Xanax 0.25mg  ODT - prn panic attacks - taking daily Vraylar 1.5mg  daily for mood symptoms - samples given - 4 weeks. Hydroxyzine 50mg  1 to 2 at hs   Lamictal 100mg  at bedtime.  Patient totally disabled and unable to work at this time. Out of work 06/10/2023 through 07/10/2023  RTC 4 weeks.  Patient advised to contact office with any questions, adverse effects, or acute worsening in signs and  symptoms.  Discussed potential metabolic side effects associated with atypical antipsychotics, as well as potential risk for movement side effects. Advised pt to contact office if movement side effects occur.   Discussed potential benefits, risk, and side effects of benzodiazepines to include potential risk of tolerance and dependence, as well as possible drowsiness. Advised patient not to drive if experiencing drowsiness and to take lowest possible effective dose to minimize risk of dependence and tolerance.  Diagnoses and all orders for this visit:  Major depressive disorder, recurrent episode, moderate (HCC)  Generalized anxiety disorder  Panic attacks  Insomnia, unspecified type     Please see After Visit Summary for patient specific instructions.  No future appointments.  No orders of the defined types were placed in this encounter.   -------------------------------

## 2023-06-27 ENCOUNTER — Ambulatory Visit (INDEPENDENT_AMBULATORY_CARE_PROVIDER_SITE_OTHER): Payer: Self-pay | Admitting: Adult Health

## 2023-06-27 ENCOUNTER — Telehealth: Payer: Self-pay | Admitting: Adult Health

## 2023-06-27 ENCOUNTER — Encounter: Payer: Self-pay | Admitting: Adult Health

## 2023-06-27 DIAGNOSIS — F411 Generalized anxiety disorder: Secondary | ICD-10-CM

## 2023-06-27 DIAGNOSIS — G4700 Insomnia, unspecified: Secondary | ICD-10-CM

## 2023-06-27 DIAGNOSIS — F410 Panic disorder [episodic paroxysmal anxiety] without agoraphobia: Secondary | ICD-10-CM

## 2023-06-27 DIAGNOSIS — F331 Major depressive disorder, recurrent, moderate: Secondary | ICD-10-CM

## 2023-06-27 MED ORDER — LAMOTRIGINE 150 MG PO TABS
ORAL_TABLET | ORAL | 2 refills | Status: DC
Start: 2023-06-27 — End: 2023-10-01

## 2023-06-27 MED ORDER — ALPRAZOLAM 0.25 MG PO TABS
0.2500 mg | ORAL_TABLET | Freq: Two times a day (BID) | ORAL | 2 refills | Status: DC | PRN
Start: 2023-06-27 — End: 2023-07-29

## 2023-06-27 NOTE — Telephone Encounter (Signed)
Karen Wu called at 1:25 stating that the work note you did for her today has the wrong date on it.  It should indicated that the last time seen in the office was today, ;not 06/06/23.  Please redo the note.  Nadeen will pick it up tomorrow.

## 2023-06-27 NOTE — Telephone Encounter (Signed)
New letter was made. Called [facility] to ask if we could fax it, she is just going to pick it up tomorrow.

## 2023-06-27 NOTE — Progress Notes (Signed)
Karen Wu [id number] 1965/07/25 57 y.o.  Subjective:   Patient ID:  Karen Wu is a 57 y.o. (DOB Apr 07, 1966) female.  Chief Complaint: No chief complaint on file.   HPI Karen Wu presents to the office today for follow-up of MDD, GAD, insomnia,and panic attacks.  Describes mood today as "not good". Pleasant. Flat. Reports tearfulness - "crying". Mood symptoms - reports depression, anxiety, and irritability. Reports panic attacks. Reports worry, rumination and over thinking. Reports ongoing situational stressors with being out of work. Reports she is unable to afford health care insurance to get the care she needs. Mood is lower. Stating "I haven't been doing good". Reports current medications are helpful, but feels like she is still struggling with mood instability. Does not feel like she is able to return to work with current mental health and physical disabilities. Varying interest and motivation. Taking medications as prescribed.  Energy levels lower. Active, does not have a regular exercise routine. Enjoys some usual interests and activities. Married. Lives with husband 2 dogs. Has 3 children - one at home - 40, 37, and 20. Has 5 grandchildren.  Appetite adequate. Weight stable - 200 pounds.  Sleep has improved - has nights where she can't sleep due to worry. Averaging 6 to 8 hours. Reports using CPAP machine.  Reports focus and concentration "difficulties". Reports forgetfulness - "I forget what I need to be doing "- "if I don't write it down it doesn't get done". Completing some tasks. Managing minimal aspects of household. Currently out of work and is unable to return to work at this time. Denies SI or HI.  Denies AH or VH Denies self harm. Denies substance use.    Previous medications: Paxil   PHQ2-9    Flowsheet Row Telephone from 05/30/2022 in [facility]  PHQ-2 Total Score 1        Review of Systems:  Review of Systems   Musculoskeletal:  Negative for gait problem.  Neurological:  Negative for tremors.  Psychiatric/Behavioral:         Please refer to HPI    Medications: I have reviewed the patient's current medications.  Current Outpatient Medications  Medication Sig Dispense Refill   Albuterol Sulfate (PROAIR RESPICLICK) 108 (90 Base) MCG/ACT AEPB Inhale 1-2 puffs into the lungs 3 (three) times daily as needed (wheezing/shortness of breath).      ALPRAZolam (XANAX) 0.25 MG tablet Take 1 tablet (0.25 mg total) by mouth daily. 30 tablet 2   amLODipine (NORVASC) 5 MG tablet Take 5 mg by mouth daily.     aspirin EC 81 MG tablet Take 81 mg by mouth daily.      BRILINTA 90 MG TABS tablet TAKE 1 TABLET BY MOUTH TWICE A DAY 60 tablet 3   empagliflozin (JARDIANCE) 25 MG TABS tablet Take 25 mg by mouth daily.      famotidine (PEPCID) 40 MG tablet Take 40 mg by mouth at bedtime.      fluconazole (DIFLUCAN) 150 MG tablet Take 150 mg by mouth daily.     gabapentin (NEURONTIN) 300 MG capsule Take 300 mg by mouth 2 (two) times daily.      HUMIRA PEN 40 MG/0.4ML PNKT Inject 40 mg into the skin every 14 (fourteen) days. Fridays.     hydrochlorothiazide (HYDRODIURIL) 25 MG tablet Take 25 mg by mouth daily.     hydrocortisone (ANUSOL-HC) 25 MG suppository Place 25 mg rectally at bedtime.     hydrOXYzine (ATARAX)  50 MG tablet Take one to two tablets at bedtime for sleep. 180 tablet 0   irbesartan (AVAPRO) 150 MG tablet Take 150 mg by mouth daily.     lamoTRIgine (LAMICTAL) 100 MG tablet Take one tablet at bedtime. 30 tablet 2   LEVEMIR FLEXTOUCH 100 UNIT/ML Pen Inject 5-45 Units into the skin See admin instructions. Inject 5 units in the morning & 45 units at bedtime     metFORMIN (GLUCOPHAGE) 1000 MG tablet Take 1,000 mg by mouth 2 (two) times daily with a meal.     omeprazole (PRILOSEC) 20 MG capsule Take 20 mg by mouth every morning.     oxybutynin (DITROPAN-XL) 10 MG 24 hr tablet Take 10 mg by mouth at bedtime.       PENNSAID 2 % SOLN Apply 1 application topically 2 (two) times daily as needed (hip pain.).     rosuvastatin (CRESTOR) 20 MG tablet Take 20 mg by mouth at bedtime.      TRULICITY 1.5 MG/0.5ML SOPN      Venlafaxine HCl 225 MG TB24 Take 225 mg by mouth at bedtime.     No current facility-administered medications for this visit.    Medication Side Effects: None  Allergies:  Allergies  Allergen Reactions   Metoprolol Tartrate Other (See Comments)    Past Medical History:  Diagnosis Date   Arthritis    hips, knees, lower back   Asthma    rare inhaler use   Brain tumor (HCC)    x 2 - appear to be meningiomas, per neurosurgeon's report   Dental crown present    Diabetes (HCC)    Family history of adverse reaction to anesthesia    pt's father woke up during surgery; pt's mother has hx. of being hard to wake up post-op   GERD (gastroesophageal reflux disease)    OTC as needed   Graves' disease    no current med.   Heart murmur    as a child, states no problems   History of anemia    no problems since endometrial ablation   Hypertension    under control with meds., per pt.; has been on med. x 21 yr.   Insulin dependent diabetes mellitus    Trigger finger of right hand 10/2014   thumb and long finger    Past Medical History, Surgical history, Social history, and Family history were reviewed and updated as appropriate.   Please see review of systems for further details on the patient's review from today.   Objective:   Physical Exam:  LMP 09/30/2014 (Approximate)   Physical Exam Constitutional:      General: She is not in acute distress. Musculoskeletal:        General: No deformity.  Neurological:     Mental Status: She is alert and oriented to person, place, and time.     Coordination: Coordination normal.  Psychiatric:        Attention and Perception: Attention and perception normal. She does not perceive auditory or visual hallucinations.        Mood and Affect:  Affect is not labile, blunt, angry or inappropriate.        Speech: Speech normal.        Behavior: Behavior normal.        Thought Content: Thought content normal. Thought content is not paranoid or delusional. Thought content does not include homicidal or suicidal ideation. Thought content does not include homicidal or suicidal plan.  Cognition and Memory: Cognition and memory normal.        Judgment: Judgment normal.     Comments: Insight intact     Lab Review:     Component Value Date/Time   NA 140 04/07/2019 0914   K 4.1 04/07/2019 0914   CL 102 04/07/2019 0914   CO2 20 (L) 03/16/2019 1757   GLUCOSE 168 (H) 04/07/2019 0914   BUN 14 04/07/2019 0914   CREATININE 0.50 04/07/2019 0914   CREATININE 0.61 09/03/2012 1008   CALCIUM 8.6 (L) 03/16/2019 1757   GFRNONAA >60 03/16/2019 1757   GFRNONAA >60 09/03/2012 1008   GFRAA >60 03/16/2019 1757   GFRAA >60 09/03/2012 1008       Component Value Date/Time   WBC 12.9 (H) 03/18/2019 1123   RBC 3.12 (L) 03/18/2019 1123   HGB 14.6 04/07/2019 0914   HCT 43.0 04/07/2019 0914   PLT 320 03/18/2019 1123   MCV 86.5 03/18/2019 1123   MCH 28.2 03/18/2019 1123   MCHC 32.6 03/18/2019 1123   RDW 16.2 (H) 03/18/2019 1123   LYMPHSABS 2.5 03/18/2019 0302   MONOABS 1.4 (H) 03/18/2019 0302   EOSABS 0.3 03/18/2019 0302   BASOSABS 0.1 03/18/2019 0302    No results found for: "POCLITH", "LITHIUM"   No results found for: "PHENYTOIN", "PHENOBARB", "VALPROATE", "CBMZ"   .res Assessment: Plan:    Plan:  Effexor XR 225mg  every morning - PCP prescribing Vraylar 1.5mg  daily for mood symptoms - samples given - 4 weeks. Hydroxyzine 50mg  1 to 2 at hs   Increase Lamictal 100mg  to 150mg  at bedtime. Increase Xanax 0.25mg  ODT - prn panic attacks - to twice daily.  Patient totally disabled and unable to work at this time. Out of work 06/27/2023 through 08/16/2023.  RTC 4 weeks.  Patient advised to contact office with any questions, adverse  effects, or acute worsening in signs and symptoms.  Discussed potential metabolic side effects associated with atypical antipsychotics, as well as potential risk for movement side effects. Advised pt to contact office if movement side effects occur.   Discussed potential benefits, risk, and side effects of benzodiazepines to include potential risk of tolerance and dependence, as well as possible drowsiness. Advised patient not to drive if experiencing drowsiness and to take lowest possible effective dose to minimize risk of dependence and tolerance.  There are no diagnoses linked to this encounter.   Please see After Visit Summary for patient specific instructions.  Future Appointments  Date Time Provider Department Center  06/27/2023  9:40 AM Keena Dinse, Thereasa Solo, NP [facility] None    No orders of the defined types were placed in this encounter.   -------------------------------

## 2023-07-10 ENCOUNTER — Other Ambulatory Visit: Payer: Self-pay | Admitting: Adult Health

## 2023-07-10 DIAGNOSIS — G4700 Insomnia, unspecified: Secondary | ICD-10-CM

## 2023-07-25 ENCOUNTER — Telehealth: Payer: Self-pay | Admitting: Adult Health

## 2023-07-25 ENCOUNTER — Other Ambulatory Visit: Payer: Self-pay | Admitting: Adult Health

## 2023-07-25 DIAGNOSIS — G4700 Insomnia, unspecified: Secondary | ICD-10-CM

## 2023-07-26 NOTE — Telephone Encounter (Signed)
Has appt 1/13

## 2023-07-29 ENCOUNTER — Encounter: Payer: Self-pay | Admitting: Adult Health

## 2023-07-29 ENCOUNTER — Ambulatory Visit (INDEPENDENT_AMBULATORY_CARE_PROVIDER_SITE_OTHER): Payer: Self-pay | Admitting: Adult Health

## 2023-07-29 DIAGNOSIS — F331 Major depressive disorder, recurrent, moderate: Secondary | ICD-10-CM

## 2023-07-29 DIAGNOSIS — F411 Generalized anxiety disorder: Secondary | ICD-10-CM

## 2023-07-29 DIAGNOSIS — G4700 Insomnia, unspecified: Secondary | ICD-10-CM

## 2023-07-29 DIAGNOSIS — F410 Panic disorder [episodic paroxysmal anxiety] without agoraphobia: Secondary | ICD-10-CM

## 2023-07-29 MED ORDER — HYDROXYZINE HCL 50 MG PO TABS: ORAL_TABLET | ORAL | 2 refills | Status: DC

## 2023-07-29 MED ORDER — ALPRAZOLAM 0.25 MG PO TABS: 0.2500 mg | ORAL_TABLET | Freq: Two times a day (BID) | ORAL | 2 refills | Status: DC | PRN

## 2023-07-29 NOTE — Progress Notes (Signed)
 Karen Wu [id number] 1966-04-15 58 y.o.  Subjective:   Patient ID:  Karen Wu is a 58 y.o. (DOB [birth year]) female.  Chief Complaint: No chief complaint on file.   HPI Karen Wu presents to the office today for follow-up of MDD, GAD, insomnia,and panic attacks.  Describes mood today as about the same. Pleasant. Flat. Reports decreased tearfulness - over the past 2 weeks. Mood symptoms - reports depression, anxiety, and irritability. Reports panic attacks - more so at night. Reports some worry, rumination and over thinking. Reports ongoing situational stressors. Mood is lower. Stating I feel like I'm making some progress. Reports current medications are helpful for mood instability. She is now retired and will not be returning to work setting. Varying interest and motivation. Taking medications as prescribed.  Energy levels lower - getting tired easily Active, does not have a regular exercise routine. Enjoys some usual interests and activities. Married. Lives with husband 2 dogs. Has 3 children - one at home - 76, 51, and 20. Has 5 grandchildren.  Appetite adequate. Weight stable - 200 pounds.  Sleeping better at nights. Averaging 8 to 9 hours - but still feels tired. Reports using CPAP machine.  Reports focus and concentration difficulties. Completing minimal household task. Recently retired. Denies SI or HI.  Denies AH or VH Denies self harm. Denies substance use.    Previous medications: Paxil    PHQ2-9    Flowsheet Row Telephone from 05/30/2022 in [facility]  PHQ-2 Total Score 1        Review of Systems:  Review of Systems  Musculoskeletal:  Negative for gait problem.  Neurological:  Negative for tremors.  Psychiatric/Behavioral:         Please refer to HPI    Medications: I have reviewed the patient's current medications.  Current Outpatient Medications  Medication Sig Dispense Refill   Albuterol Sulfate  (PROAIR RESPICLICK) 108 (90 Base) MCG/ACT AEPB Inhale 1-2 puffs into the lungs 3 (three) times daily as needed (wheezing/shortness of breath).      ALPRAZolam  (XANAX ) 0.25 MG tablet Take 1 tablet (0.25 mg total) by mouth 2 (two) times daily as needed for anxiety. 60 tablet 2   amLODipine (NORVASC) 5 MG tablet Take 5 mg by mouth daily.     aspirin  EC 81 MG tablet Take 81 mg by mouth daily.      BRILINTA  90 MG TABS tablet TAKE 1 TABLET BY MOUTH TWICE A DAY 60 tablet 3   empagliflozin (JARDIANCE) 25 MG TABS tablet Take 25 mg by mouth daily.      famotidine (PEPCID) 40 MG tablet Take 40 mg by mouth at bedtime.      fluconazole (DIFLUCAN) 150 MG tablet Take 150 mg by mouth daily.     gabapentin  (NEURONTIN ) 300 MG capsule Take 300 mg by mouth 2 (two) times daily.      HUMIRA PEN 40 MG/0.4ML PNKT Inject 40 mg into the skin every 14 (fourteen) days. Fridays.     hydrochlorothiazide (HYDRODIURIL) 25 MG tablet Take 25 mg by mouth daily.     hydrocortisone (ANUSOL-HC) 25 MG suppository Place 25 mg rectally at bedtime.     hydrOXYzine  (ATARAX ) 50 MG tablet Take one to two tablets at bedtime for sleep. 60 tablet 2   irbesartan (AVAPRO) 150 MG tablet Take 150 mg by mouth daily.     lamoTRIgine  (LAMICTAL ) 150 MG tablet Take one tablet at bedtime. 30 tablet 2   LEVEMIR FLEXTOUCH  100 UNIT/ML Pen Inject 5-45 Units into the skin See admin instructions. Inject 5 units in the morning & 45 units at bedtime     metFORMIN (GLUCOPHAGE) 1000 MG tablet Take 1,000 mg by mouth 2 (two) times daily with a meal.     omeprazole (PRILOSEC) 20 MG capsule Take 20 mg by mouth every morning.     oxybutynin (DITROPAN-XL) 10 MG 24 hr tablet Take 10 mg by mouth at bedtime.      PENNSAID 2 % SOLN Apply 1 application topically 2 (two) times daily as needed (hip pain.).     rosuvastatin (CRESTOR) 20 MG tablet Take 20 mg by mouth at bedtime.      TRULICITY 1.5 MG/0.5ML SOPN      Venlafaxine  HCl 225 MG TB24 Take 225 mg by mouth at bedtime.      No current facility-administered medications for this visit.    Medication Side Effects: None  Allergies:  Allergies  Allergen Reactions   Metoprolol Tartrate Other (See Comments)    Past Medical History:  Diagnosis Date   Arthritis    hips, knees, lower back   Asthma    rare inhaler use   Brain tumor (HCC)    x 2 - appear to be meningiomas, per neurosurgeon's report   Dental crown present    Diabetes (HCC)    Family history of adverse reaction to anesthesia    pt's father woke up during surgery; pt's mother has hx. of being hard to wake up post-op   GERD (gastroesophageal reflux disease)    OTC as needed   Graves' disease    no current med.   Heart murmur    as a child, states no problems   History of anemia    no problems since endometrial ablation   Hypertension    under control with meds., per pt.; has been on med. x 21 yr.   Insulin  dependent diabetes mellitus    Trigger finger of right hand 10/2014   thumb and long finger    Past Medical History, Surgical history, Social history, and Family history were reviewed and updated as appropriate.   Please see review of systems for further details on the patient's review from today.   Objective:   Physical Exam:  LMP 09/30/2014 (Approximate)   Physical Exam Constitutional:      General: She is not in acute distress. Musculoskeletal:        General: No deformity.  Neurological:     Mental Status: She is alert and oriented to person, place, and time.     Coordination: Coordination normal.  Psychiatric:        Attention and Perception: Attention and perception normal. She does not perceive auditory or visual hallucinations.        Mood and Affect: Affect is not labile, blunt, angry or inappropriate.        Speech: Speech normal.        Behavior: Behavior normal.        Thought Content: Thought content normal. Thought content is not paranoid or delusional. Thought content does not include homicidal or  suicidal ideation. Thought content does not include homicidal or suicidal plan.        Cognition and Memory: Cognition and memory normal.        Judgment: Judgment normal.     Comments: Insight intact     Lab Review:     Component Value Date/Time   NA 140 04/07/2019 0914   K  4.1 04/07/2019 0914   CL 102 04/07/2019 0914   CO2 20 (L) 03/16/2019 1757   GLUCOSE 168 (H) 04/07/2019 0914   BUN 14 04/07/2019 0914   CREATININE 0.50 04/07/2019 0914   CREATININE 0.61 09/03/2012 1008   CALCIUM 8.6 (L) 03/16/2019 1757   GFRNONAA >60 03/16/2019 1757   GFRNONAA >60 09/03/2012 1008   GFRAA >60 03/16/2019 1757   GFRAA >60 09/03/2012 1008       Component Value Date/Time   WBC 12.9 (H) 03/18/2019 1123   RBC 3.12 (L) 03/18/2019 1123   HGB 14.6 04/07/2019 0914   HCT 43.0 04/07/2019 0914   PLT 320 03/18/2019 1123   MCV 86.5 03/18/2019 1123   MCH 28.2 03/18/2019 1123   MCHC 32.6 03/18/2019 1123   RDW 16.2 (H) 03/18/2019 1123   LYMPHSABS 2.5 03/18/2019 0302   MONOABS 1.4 (H) 03/18/2019 0302   EOSABS 0.3 03/18/2019 0302   BASOSABS 0.1 03/18/2019 0302    No results found for: POCLITH, LITHIUM   No results found for: PHENYTOIN, PHENOBARB, VALPROATE, CBMZ   .res Assessment: Plan:    Plan:  Effexor  XR 225mg  every morning - PCP prescribing Vraylar 1.5mg  daily for mood symptoms - samples given - 4 weeks. Hydroxyzine  50mg  1 to 2 at hs  Lamictal  100mg  to 150mg  at bedtime. Xanax  0.25mg  ODT - prn panic attacks - up to twice daily.  RTC 8 weeks - awaiting insurance.  Patient advised to contact office with any questions, adverse effects, or acute worsening in signs and symptoms.  Discussed potential metabolic side effects associated with atypical antipsychotics, as well as potential risk for movement side effects. Advised pt to contact office if movement side effects occur.   Discussed potential benefits, risk, and side effects of benzodiazepines to include potential risk of  tolerance and dependence, as well as possible drowsiness. Advised patient not to drive if experiencing drowsiness and to take lowest possible effective dose to minimize risk of dependence and tolerance.  Diagnoses and all orders for this visit:  Major depressive disorder, recurrent episode, moderate (HCC)  Generalized anxiety disorder -     ALPRAZolam  (XANAX ) 0.25 MG tablet; Take 1 tablet (0.25 mg total) by mouth 2 (two) times daily as needed for anxiety.  Panic attacks -     ALPRAZolam  (XANAX ) 0.25 MG tablet; Take 1 tablet (0.25 mg total) by mouth 2 (two) times daily as needed for anxiety.  Insomnia, unspecified type -     hydrOXYzine  (ATARAX ) 50 MG tablet; Take one to two tablets at bedtime for sleep.     Please see After Visit Summary for patient specific instructions.  No future appointments.   No orders of the defined types were placed in this encounter.   -------------------------------

## 2023-10-01 ENCOUNTER — Ambulatory Visit: Payer: Self-pay | Admitting: Adult Health

## 2023-10-01 ENCOUNTER — Encounter: Payer: Self-pay | Admitting: Adult Health

## 2023-10-01 DIAGNOSIS — F410 Panic disorder [episodic paroxysmal anxiety] without agoraphobia: Secondary | ICD-10-CM

## 2023-10-01 DIAGNOSIS — F411 Generalized anxiety disorder: Secondary | ICD-10-CM

## 2023-10-01 DIAGNOSIS — F331 Major depressive disorder, recurrent, moderate: Secondary | ICD-10-CM

## 2023-10-01 DIAGNOSIS — G4700 Insomnia, unspecified: Secondary | ICD-10-CM

## 2023-10-01 MED ORDER — ALPRAZOLAM 0.25 MG PO TABS: 0.2500 mg | ORAL_TABLET | Freq: Two times a day (BID) | ORAL | 2 refills | Status: DC | PRN

## 2023-10-01 MED ORDER — HYDROXYZINE HCL 50 MG PO TABS: ORAL_TABLET | ORAL | 2 refills | Status: AC

## 2023-10-01 MED ORDER — LAMOTRIGINE 150 MG PO TABS: ORAL_TABLET | ORAL | 2 refills | Status: DC

## 2023-10-01 NOTE — Progress Notes (Signed)
 Karen Wu [id number] 11-16-1965 58 y.o.  Subjective:   Patient ID:  Karen Wu is a 58 y.o. (DOB [birth year]) female.  Chief Complaint: No chief complaint on file.   HPI Karen Wu presents to the office today for follow-up of MDD, GAD, insomnia,and panic attacks.  Describes mood today as "not the best". Pleasant. Flat. Reports tearfulness. Mood symptoms - reports depression, anxiety, and irritability. Reports varying interest and motivation. Reports panic attacks - "not as bad". Reports some worry, rumination and over thinking. Reports ongoing situational stressors - health. Mood is lower. Stating "I feel like I'm doing ok - not as bad as I was, but not where I want to be". Reports current medications are helpful. Taking medications as prescribed.  Energy levels lower - "I have no energy" Active, does not have a regular exercise routine. Enjoys some usual interests and activities. Married. Lives with husband 2 dogs. Has 3 children - 5 grandchildren.  Appetite adequate. Weight stable - 200 pounds. Reports sleeping difficulties. Averaging 6 to 8 hours of broken sleep - feels tired all the time. Reports difficulties using CPAP machine - causing panic attacks.  Reports focus and concentration "difficulties" - "it's still bad". Has tried reading and has not made any progress. Completing minimal household tasks. Recently retired. Denies SI or HI.  Denies AH or VH Denies self harm. Denies substance use.    Previous medications: Paxil  PHQ2-9    Flowsheet Row Telephone from 05/30/2022 in [facility]  PHQ-2 Total Score 1        Review of Systems:  Review of Systems  Musculoskeletal:  Negative for gait problem.  Neurological:  Negative for tremors.  Psychiatric/Behavioral:         Please refer to HPI    Medications: I have reviewed the patient's current medications.  Current Outpatient Medications  Medication Sig Dispense Refill    Albuterol Sulfate (PROAIR RESPICLICK) 108 (90 Base) MCG/ACT AEPB Inhale 1-2 puffs into the lungs 3 (three) times daily as needed (wheezing/shortness of breath).      ALPRAZolam (XANAX) 0.25 MG tablet Take 1 tablet (0.25 mg total) by mouth 2 (two) times daily as needed for anxiety. 60 tablet 2   amLODipine (NORVASC) 5 MG tablet Take 5 mg by mouth daily.     aspirin EC 81 MG tablet Take 81 mg by mouth daily.      BRILINTA 90 MG TABS tablet TAKE 1 TABLET BY MOUTH TWICE A DAY 60 tablet 3   empagliflozin (JARDIANCE) 25 MG TABS tablet Take 25 mg by mouth daily.      famotidine (PEPCID) 40 MG tablet Take 40 mg by mouth at bedtime.      fluconazole (DIFLUCAN) 150 MG tablet Take 150 mg by mouth daily.     gabapentin (NEURONTIN) 300 MG capsule Take 300 mg by mouth 2 (two) times daily.      HUMIRA PEN 40 MG/0.4ML PNKT Inject 40 mg into the skin every 14 (fourteen) days. Fridays.     hydrochlorothiazide (HYDRODIURIL) 25 MG tablet Take 25 mg by mouth daily.     hydrocortisone (ANUSOL-HC) 25 MG suppository Place 25 mg rectally at bedtime.     hydrOXYzine (ATARAX) 50 MG tablet Take one to two tablets at bedtime for sleep. 60 tablet 2   irbesartan (AVAPRO) 150 MG tablet Take 150 mg by mouth daily.     lamoTRIgine (LAMICTAL) 150 MG tablet Take one tablet at bedtime. 30 tablet 2  LEVEMIR FLEXTOUCH 100 UNIT/ML Pen Inject 5-45 Units into the skin See admin instructions. Inject 5 units in the morning & 45 units at bedtime     metFORMIN (GLUCOPHAGE) 1000 MG tablet Take 1,000 mg by mouth 2 (two) times daily with a meal.     omeprazole (PRILOSEC) 20 MG capsule Take 20 mg by mouth every morning.     oxybutynin (DITROPAN-XL) 10 MG 24 hr tablet Take 10 mg by mouth at bedtime.      PENNSAID 2 % SOLN Apply 1 application topically 2 (two) times daily as needed (hip pain.).     rosuvastatin (CRESTOR) 20 MG tablet Take 20 mg by mouth at bedtime.      TRULICITY 1.5 MG/0.5ML SOPN      Venlafaxine HCl 225 MG TB24 Take 225 mg by  mouth at bedtime.     No current facility-administered medications for this visit.    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 of being hard to wake up post-op   GERD (gastroesophageal reflux disease)    OTC as needed   Graves' disease    no current med.   Heart murmur    as a child, states no problems   History of anemia    no problems since endometrial ablation   Hypertension    under control with meds., per pt.; has been on med. x 21 yr.   Insulin dependent diabetes mellitus    Trigger finger of right hand 10/2014   thumb and long finger    Past Medical History, Surgical history, Social history, and Family history were reviewed and updated as appropriate.   Please see review of systems for further details on the patient's review from today.   Objective:   Physical Exam:  LMP 09/30/2014 (Approximate)   Physical Exam Constitutional:      General: She is not in acute distress. Musculoskeletal:        General: No deformity.  Neurological:     Mental Status: She is alert and oriented to person, place, and time.     Coordination: Coordination normal.  Psychiatric:        Attention and Perception: Attention and perception normal. She does not perceive auditory or visual hallucinations.        Mood and Affect: Mood normal. Mood is not anxious or depressed. Affect is not labile, blunt, angry or inappropriate.        Speech: Speech normal.        Behavior: Behavior normal.        Thought Content: Thought content normal.  Thought content is not paranoid or delusional. Thought content does not include homicidal or suicidal ideation. Thought content does not include homicidal or suicidal plan.        Cognition and Memory: Cognition and memory normal.        Judgment: Judgment normal.     Comments: Insight intact     Lab Review:  Component Value Date/Time   NA 140 04/07/2019 0914   K 4.1 04/07/2019 0914   CL 102 04/07/2019 0914   CO2 20 (L) 03/16/2019 1757   GLUCOSE 168 (H) 04/07/2019 0914   BUN 14 04/07/2019 0914   CREATININE 0.50 04/07/2019 0914   CREATININE 0.61 09/03/2012 1008   CALCIUM 8.6 (L) 03/16/2019 1757   GFRNONAA >60 03/16/2019 1757   GFRNONAA >60 09/03/2012 1008   GFRAA >60 03/16/2019 1757   GFRAA >60 09/03/2012 1008       Component Value Date/Time   WBC 12.9 (H) 03/18/2019 1123   RBC 3.12 (L) 03/18/2019 1123   HGB 14.6 04/07/2019 0914   HCT 43.0 04/07/2019 0914   PLT 320 03/18/2019 1123   MCV 86.5 03/18/2019 1123   MCH 28.2 03/18/2019 1123   MCHC 32.6 03/18/2019 1123   RDW 16.2 (H) 03/18/2019 1123   LYMPHSABS 2.5 03/18/2019 0302   MONOABS 1.4 (H) 03/18/2019 0302   EOSABS 0.3 03/18/2019 0302   BASOSABS 0.1 03/18/2019 0302    No results found for: "POCLITH", "LITHIUM"   No results found for: "PHENYTOIN", "PHENOBARB", "VALPROATE", "CBMZ"   .res Assessment: Plan:    Plan:  Effexor XR 225mg  every morning - PCP prescribing  Hydroxyzine 50mg  1 to 2 at hs  Lamictal 150mg  at bedtime. Xanax 0.25mg  ODT - prn panic attacks - up to twice daily.  D/C Vraylar 1.5mg  daily for mood symptoms - unable to afford - doing ok without it.  RTC 3 months  Patient advised to contact office with any questions, adverse effects, or acute worsening in signs and symptoms.  Discussed potential benefits, risk, and side effects of benzodiazepines to include potential risk of tolerance and dependence, as well as possible drowsiness. Advised patient not to drive if experiencing drowsiness and  to take lowest possible effective dose to minimize risk of dependence and tolerance.  Diagnoses and all orders for this visit:  Insomnia, unspecified type -     hydrOXYzine (ATARAX) 50 MG tablet; Take one to two tablets at bedtime for sleep.  Major depressive disorder, recurrent episode, moderate (HCC) -     lamoTRIgine (LAMICTAL) 150 MG tablet; Take one tablet at bedtime.  Generalized anxiety disorder -     ALPRAZolam (XANAX) 0.25 MG tablet; Take 1 tablet (0.25 mg total) by mouth 2 (two) times daily as needed for anxiety.  Panic attacks -     ALPRAZolam (XANAX) 0.25 MG tablet; Take 1 tablet (0.25 mg total) by mouth 2 (two) times daily as needed for anxiety.     Please see After Visit Summary for patient specific instructions.  No future appointments.  No orders of the defined types were placed in this encounter.   -------------------------------

## 2023-11-05 ENCOUNTER — Other Ambulatory Visit: Payer: Self-pay | Admitting: Adult Health

## 2024-01-01 ENCOUNTER — Ambulatory Visit (INDEPENDENT_AMBULATORY_CARE_PROVIDER_SITE_OTHER): Payer: Self-pay | Admitting: Adult Health

## 2024-01-20 DIAGNOSIS — Z6831 Body mass index (BMI) 31.0-31.9, adult: Secondary | ICD-10-CM | POA: Diagnosis not present

## 2024-01-20 DIAGNOSIS — F411 Generalized anxiety disorder: Secondary | ICD-10-CM | POA: Diagnosis not present

## 2024-01-20 DIAGNOSIS — E1165 Type 2 diabetes mellitus with hyperglycemia: Secondary | ICD-10-CM | POA: Diagnosis not present

## 2024-01-20 DIAGNOSIS — E1143 Type 2 diabetes mellitus with diabetic autonomic (poly)neuropathy: Secondary | ICD-10-CM | POA: Diagnosis not present

## 2024-02-03 ENCOUNTER — Telehealth: Payer: Self-pay | Admitting: Pharmacist

## 2024-02-03 NOTE — Progress Notes (Signed)
   02/03/2024  Patient ID: Karen Wu, female   DOB: 03/09/1966, 58 y.o.   MRN: [id number]  Received message from Dr. Sheldon regarding medication coverage. Received message that she was having difficulty with medication coverage from the pharmacy with Trulicity and Venlafaxine  ER.   Per the pharmacy, Trulicity was picked up at $0 anVenlafaxine  is ready at $0. Upon insurance review, she also has [employer] and [employer] [facility].   No medication cost concerns at this time. Patient just needs to pick-up her medications.     Aloysius Lewis, PharmD [facility]  Phone Number: [phone]

## 2024-02-04 DIAGNOSIS — M62831 Muscle spasm of calf: Secondary | ICD-10-CM | POA: Diagnosis not present

## 2024-02-04 DIAGNOSIS — M542 Cervicalgia: Secondary | ICD-10-CM | POA: Diagnosis not present

## 2024-02-14 ENCOUNTER — Other Ambulatory Visit: Payer: Self-pay

## 2024-02-14 DIAGNOSIS — I739 Peripheral vascular disease, unspecified: Secondary | ICD-10-CM

## 2024-03-07 DIAGNOSIS — M25552 Pain in left hip: Secondary | ICD-10-CM | POA: Diagnosis not present

## 2024-03-07 DIAGNOSIS — S76012A Strain of muscle, fascia and tendon of left hip, initial encounter: Secondary | ICD-10-CM | POA: Diagnosis not present

## 2024-03-13 ENCOUNTER — Ambulatory Visit: Payer: Self-pay | Attending: Vascular Surgery | Admitting: Physician Assistant

## 2024-03-13 ENCOUNTER — Ambulatory Visit (HOSPITAL_COMMUNITY)
Admission: RE | Admit: 2024-03-13 | Discharge: 2024-03-13 | Disposition: A | Discharge status: Home / Self Care | Payer: Self-pay | Source: Ambulatory Visit | Attending: Vascular Surgery | Admitting: Vascular Surgery

## 2024-03-13 VITALS — BP 160/82 | HR 66 | Temp 97.8°F | Wt 178.1 lb

## 2024-03-13 DIAGNOSIS — I739 Peripheral vascular disease, unspecified: Secondary | ICD-10-CM | POA: Insufficient documentation

## 2024-03-14 LAB — VAS US ABI WITH/WO TBI
Left ABI: 1.23
Right ABI: 1.3

## 2024-03-17 NOTE — Progress Notes (Signed)
 Office Note   History of Present Illness   Karen Wu is a 58 y.o. (Apr 23, 1966) female who presents for surveillance of PAD.  She has a history of left common iliac artery stenting by Dr. Sheree in August 2020 for a nonhealing left great toe ingrown toenail.  Shortly after intervention, the patient's stent occluded and she required recanalization and restenting in September 2020 by Dr. Serene.  Since then the patient's toe has healed and she remains asymptomatic.  She returns today for follow-up.  She has no complaints at today's visit.  She denies any claudication, rest pain, or tissue loss.  Current Outpatient Medications  Medication Sig Dispense Refill   insulin  aspart protamine- aspart (NOVOLOG  MIX 70/30) (70-30) 100 UNIT/ML injection SMARTSIG:25 Unit(s) SUB-Q Twice Daily     meclizine (ANTIVERT) 25 MG tablet Take 25 mg by mouth.     Albuterol Sulfate (PROAIR RESPICLICK) 108 (90 Base) MCG/ACT AEPB Inhale 1-2 puffs into the lungs 3 (three) times daily as needed (wheezing/shortness of breath).      ALPRAZolam  (XANAX ) 0.25 MG tablet Take 1 tablet (0.25 mg total) by mouth 2 (two) times daily as needed for anxiety. 60 tablet 2   amLODipine (NORVASC) 5 MG tablet Take 5 mg by mouth daily.     aspirin  EC 81 MG tablet Take 81 mg by mouth daily.      BRILINTA  90 MG TABS tablet TAKE 1 TABLET BY MOUTH TWICE A DAY (Patient not taking: Reported on 03/13/2024) 60 tablet 3   empagliflozin (JARDIANCE) 25 MG TABS tablet Take 25 mg by mouth daily.      famotidine (PEPCID) 40 MG tablet Take 40 mg by mouth at bedtime.      fluconazole (DIFLUCAN) 150 MG tablet Take 150 mg by mouth daily.     gabapentin  (NEURONTIN ) 300 MG capsule Take 300 mg by mouth 2 (two) times daily.      HUMIRA PEN 40 MG/0.4ML PNKT Inject 40 mg into the skin every 14 (fourteen) days. Fridays. (Patient not taking: Reported on 03/13/2024)     hydrochlorothiazide (HYDRODIURIL) 25 MG tablet Take 25 mg by mouth daily.     hydrocortisone  (ANUSOL-HC) 25 MG suppository Place 25 mg rectally at bedtime.     hydrOXYzine  (ATARAX ) 50 MG tablet Take one to two tablets at bedtime for sleep. 60 tablet 2   irbesartan (AVAPRO) 150 MG tablet Take 150 mg by mouth daily.     lamoTRIgine  (LAMICTAL ) 150 MG tablet Take one tablet at bedtime. 30 tablet 2   LEVEMIR FLEXTOUCH 100 UNIT/ML Pen Inject 5-45 Units into the skin See admin instructions. Inject 5 units in the morning & 45 units at bedtime (Patient not taking: Reported on 03/13/2024)     metFORMIN (GLUCOPHAGE) 1000 MG tablet Take 1,000 mg by mouth 2 (two) times daily with a meal.     omeprazole (PRILOSEC) 20 MG capsule Take 20 mg by mouth every morning.     oxybutynin (DITROPAN-XL) 10 MG 24 hr tablet Take 10 mg by mouth at bedtime.      PENNSAID 2 % SOLN Apply 1 application topically 2 (two) times daily as needed (hip pain.).     rosuvastatin (CRESTOR) 20 MG tablet Take 20 mg by mouth at bedtime.      TRULICITY 1.5 MG/0.5ML SOPN      Venlafaxine  HCl 225 MG TB24 Take 225 mg by mouth at bedtime.     No current facility-administered medications for this visit.  REVIEW OF SYSTEMS (negative unless checked):   Cardiac:  []  Chest pain or chest pressure? []  Shortness of breath upon activity? []  Shortness of breath when lying flat? []  Irregular heart rhythm?  Vascular:  []  Pain in calf, thigh, or hip brought on by walking? []  Pain in feet at night that wakes you up from your sleep? []  Blood clot in your veins? []  Leg swelling?  Pulmonary:  []  Oxygen at home? []  Productive cough? []  Wheezing?  Neurologic:  []  Sudden weakness in arms or legs? []  Sudden numbness in arms or legs? []  Sudden onset of difficult speaking or slurred speech? []  Temporary loss of vision in one eye? []  Problems with dizziness?  Gastrointestinal:  []  Blood in stool? []  Vomited blood?  Genitourinary:  []  Burning when urinating? []  Blood in urine?  Psychiatric:  []  Major depression  Hematologic:   []  Bleeding problems? []  Problems with blood clotting?  Dermatologic:  []  Rashes or ulcers?  Constitutional:  []  Fever or chills?  Ear/Nose/Throat:  []  Change in hearing? []  Nose bleeds? []  Sore throat?  Musculoskeletal:  []  Back pain? []  Joint pain? []  Muscle pain?   Physical Examination   Vitals:   03/13/24 0852  BP: (!) 160/82  Pulse: 66  Temp: 97.8 F (36.6 C)  TempSrc: Temporal  Weight: 178 lb 1.6 oz (80.8 kg)   Body mass index is 29.64 kg/m.  General:  WDWN in NAD; vital signs documented above Gait: Not observed HENT: WNL, normocephalic Pulmonary: normal non-labored breathing , without rales, rhonchi,  wheezing Cardiac: regular Abdomen: soft, NT, no masses Skin: without rashes Vascular Exam/Pulses: Palpable femoral and PT pulses bilaterally Extremities: without ischemic changes, without gangrene , without cellulitis; without open wounds;  Musculoskeletal: no muscle wasting or atrophy  Neurologic: A&O X 3;  No focal weakness or paresthesias are detected Psychiatric:  The pt has Normal affect.  Non-Invasive Vascular imaging   ABI (03/13/2024) R:  ABI: 1.3 (1.25),  PT: tri DP: tri TBI: 0.73 L:  ABI: 1.23 (1.2),  PT: tri DP: tri TBI: 0.3   Medical Decision Making   Karen Wu is a 58 y.o. female who presents for surveillance of PAD  Based on the patient's vascular studies, her ABIs are stable since her last office visit.  Her right ABI is 1.3 and left ABI is 1.23 She denies any rest pain, claudication, or tissue loss  On exam she has palpable femoral and PT pulses bilaterally She can continue her aspirin , Brilinta , and statin and follow-up with our office in 1 year with repeat ABIs   Ahmed Holster PA-C [facility] Office: [phone]  Clinic MD: [place]

## 2024-03-20 DIAGNOSIS — S61210A Laceration without foreign body of right index finger without damage to nail, initial encounter: Secondary | ICD-10-CM | POA: Diagnosis not present

## 2024-03-20 DIAGNOSIS — M79644 Pain in right finger(s): Secondary | ICD-10-CM | POA: Diagnosis not present

## 2024-04-02 ENCOUNTER — Ambulatory Visit: Payer: Self-pay | Admitting: Adult Health

## 2024-04-02 ENCOUNTER — Encounter: Payer: Self-pay | Admitting: Adult Health

## 2024-04-02 DIAGNOSIS — F410 Panic disorder [episodic paroxysmal anxiety] without agoraphobia: Secondary | ICD-10-CM | POA: Diagnosis not present

## 2024-04-02 DIAGNOSIS — G4700 Insomnia, unspecified: Secondary | ICD-10-CM | POA: Diagnosis not present

## 2024-04-02 DIAGNOSIS — F411 Generalized anxiety disorder: Secondary | ICD-10-CM | POA: Diagnosis not present

## 2024-04-02 DIAGNOSIS — F331 Major depressive disorder, recurrent, moderate: Secondary | ICD-10-CM

## 2024-04-02 MED ORDER — ALPRAZOLAM 0.25 MG PO TABS: 0.2500 mg | ORAL_TABLET | Freq: Two times a day (BID) | ORAL | 2 refills | Status: AC | PRN

## 2024-04-02 MED ORDER — LAMOTRIGINE 150 MG PO TABS: ORAL_TABLET | ORAL | 2 refills | Status: DC

## 2024-04-02 NOTE — Progress Notes (Addendum)
 Karen Wu [id number] 1966/04/11 58 y.o.  Subjective:   Patient ID:  Karen Wu is a 58 y.o. (DOB 17-Dec-1965) female.  Chief Complaint: No chief complaint on file.   HPI Derrick ESTHA FEW presents to the office today for follow-up of MDD, GAD, insomnia, and panic attacks.   Describes mood today as comes and goes. Pleasant. Reports tearfulness. Mood symptoms - reports depression, anxiety, and irritability. Reports varying interest and motivation. Reports panic attacks. Reports some worry, rumination and over thinking. Reports increased situational stressors with father and husbands health. Reports mood is variable. Stating I feel like I'm doing ok, but I still struggle. Reports current medications are helpful. Taking medications as prescribed. Energy levels lower. Active, does not have a regular exercise routine - walking a few days a week. Enjoys some usual interests and activities. Married. Lives with husband 2 dogs. Has 3 children - 5 grandchildren.  Appetite adequate. Weight loss - 177 from 200 pounds.  Reports sleeping well most nights. Averaging 6 to 8 hours of broken sleep - feeling tired. Reports difficulties using CPAP machine - causing panic attacks.  Reports focus and concentration improved. Reports she has started reading again. Completing minimal household tasks. Recently retired. Denies SI or HI.  Denies AH or VH Denies self harm. Denies substance use.    Previous medications: Paxil   PHQ2-9    Flowsheet Row Telephone from 05/30/2022 in [facility]  PHQ-2 Total Score 1     Review of Systems:  Review of Systems  Musculoskeletal:  Negative for gait problem.  Neurological:  Negative for tremors.  Psychiatric/Behavioral:         Please refer to HPI    Medications: I have reviewed the patient's current medications.  Current Outpatient Medications  Medication Sig Dispense Refill   Albuterol Sulfate (PROAIR RESPICLICK) 108  (90 Base) MCG/ACT AEPB Inhale 1-2 puffs into the lungs 3 (three) times daily as needed (wheezing/shortness of breath).      ALPRAZolam  (XANAX ) 0.25 MG tablet Take 1 tablet (0.25 mg total) by mouth 2 (two) times daily as needed for anxiety. 60 tablet 2   amLODipine (NORVASC) 5 MG tablet Take 5 mg by mouth daily.     aspirin  EC 81 MG tablet Take 81 mg by mouth daily.      BRILINTA  90 MG TABS tablet TAKE 1 TABLET BY MOUTH TWICE A DAY (Patient not taking: Reported on 03/13/2024) 60 tablet 3   empagliflozin (JARDIANCE) 25 MG TABS tablet Take 25 mg by mouth daily.      famotidine (PEPCID) 40 MG tablet Take 40 mg by mouth at bedtime.      fluconazole (DIFLUCAN) 150 MG tablet Take 150 mg by mouth daily.     gabapentin  (NEURONTIN ) 300 MG capsule Take 300 mg by mouth 2 (two) times daily.      HUMIRA PEN 40 MG/0.4ML PNKT Inject 40 mg into the skin every 14 (fourteen) days. Fridays. (Patient not taking: Reported on 03/13/2024)     hydrochlorothiazide (HYDRODIURIL) 25 MG tablet Take 25 mg by mouth daily.     hydrocortisone (ANUSOL-HC) 25 MG suppository Place 25 mg rectally at bedtime.     hydrOXYzine  (ATARAX ) 50 MG tablet Take one to two tablets at bedtime for sleep. 60 tablet 2   insulin  aspart protamine- aspart (NOVOLOG  MIX 70/30) (70-30) 100 UNIT/ML injection SMARTSIG:25 Unit(s) SUB-Q Twice Daily     irbesartan (AVAPRO) 150 MG tablet Take 150 mg by mouth daily.  lamoTRIgine  (LAMICTAL ) 150 MG tablet Take one tablet at bedtime. 30 tablet 2   LEVEMIR FLEXTOUCH 100 UNIT/ML Pen Inject 5-45 Units into the skin See admin instructions. Inject 5 units in the morning & 45 units at bedtime (Patient not taking: Reported on 03/13/2024)     meclizine (ANTIVERT) 25 MG tablet Take 25 mg by mouth.     metFORMIN (GLUCOPHAGE) 1000 MG tablet Take 1,000 mg by mouth 2 (two) times daily with a meal.     omeprazole (PRILOSEC) 20 MG capsule Take 20 mg by mouth every morning.     oxybutynin (DITROPAN-XL) 10 MG 24 hr tablet Take 10  mg by mouth at bedtime.      PENNSAID 2 % SOLN Apply 1 application topically 2 (two) times daily as needed (hip pain.).     rosuvastatin (CRESTOR) 20 MG tablet Take 20 mg by mouth at bedtime.      TRULICITY 1.5 MG/0.5ML SOPN      Venlafaxine  HCl 225 MG TB24 Take 225 mg by mouth at bedtime.     No current facility-administered medications for this visit.    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 of being hard to wake up post-op   GERD (gastroesophageal reflux disease)    OTC as needed   Graves' disease    no current med.   Heart murmur    as a child, states no problems   History of anemia    no problems since endometrial ablation   Hypertension    under control with meds., per pt.; has been on med. x 21 yr.   Insulin  dependent diabetes mellitus    Trigger finger of right hand 10/2014   thumb and long finger    Past Medical History, Surgical history, Social history, and Family history were reviewed and updated as appropriate.   Please see review of systems for further details on the patient's review from today.   Objective:   Physical Exam:  LMP 09/30/2014 (Approximate)   Physical Exam Constitutional:      General: She is not in acute distress. Musculoskeletal:        General: No deformity.  Neurological:     Mental Status: She is alert and oriented to person, place, and time.     Coordination: Coordination normal.  Psychiatric:        Attention and Perception: Attention and perception normal. She does not perceive  auditory or visual hallucinations.        Mood and Affect: Mood normal. Mood is not anxious or depressed. Affect is not labile, blunt, angry or inappropriate.        Speech: Speech normal.        Behavior: Behavior normal.        Thought Content: Thought content normal. Thought content is not paranoid or delusional. Thought content does not include homicidal or suicidal ideation. Thought content does not include homicidal or suicidal plan.  Cognition and Memory: Cognition and memory normal.        Judgment: Judgment normal.     Comments: Insight intact     Lab Review:     Component Value Date/Time   NA 140 04/07/2019 0914   K 4.1 04/07/2019 0914   CL 102 04/07/2019 0914   CO2 20 (L) 03/16/2019 1757   GLUCOSE 168 (H) 04/07/2019 0914   BUN 14 04/07/2019 0914   CREATININE 0.50 04/07/2019 0914   CREATININE 0.61 09/03/2012 1008   CALCIUM 8.6 (L) 03/16/2019 1757   GFRNONAA >60 03/16/2019 1757   GFRNONAA >60 09/03/2012 1008   GFRAA >60 03/16/2019 1757   GFRAA >60 09/03/2012 1008       Component Value Date/Time   WBC 12.9 (H) 03/18/2019 1123   RBC 3.12 (L) 03/18/2019 1123   HGB 14.6 04/07/2019 0914   HCT 43.0 04/07/2019 0914   PLT 320 03/18/2019 1123   MCV 86.5 03/18/2019 1123   MCH 28.2 03/18/2019 1123   MCHC 32.6 03/18/2019 1123   RDW 16.2 (H) 03/18/2019 1123   LYMPHSABS 2.5 03/18/2019 0302   MONOABS 1.4 (H) 03/18/2019 0302   EOSABS 0.3 03/18/2019 0302   BASOSABS 0.1 03/18/2019 0302    No results found for: POCLITH, LITHIUM   No results found for: PHENYTOIN, PHENOBARB, VALPROATE, CBMZ   .res Assessment: Plan:    Plan:  Effexor  XR 225mg  every morning - PCP prescribing  Hydroxyzine  50mg  1 to 2 at hs  Lamictal  150mg  at bedtime. Xanax  0.25mg  ODT - prn panic attacks - up to twice daily.  RTC 6 months  25 minutes spent dedicated to the care of this patient on the date of this encounter to include pre-visit review of records, ordering of medication,  post visit documentation, and face-to-face time with the patient discussing MDD, GAD, insomnia, and panic attacks. Discussed continuing current medication regimen.  Patient advised to contact office with any questions, adverse effects, or acute worsening in signs and symptoms.  Discussed potential benefits, risk, and side effects of benzodiazepines to include potential risk of tolerance and dependence, as well as possible drowsiness. Advised patient not to drive if experiencing drowsiness and to take lowest possible effective dose to minimize risk of dependence and tolerance.  Diagnoses and all orders for this visit:  Major depressive disorder, recurrent episode, moderate (HCC) -     lamoTRIgine  (LAMICTAL ) 150 MG tablet; Take one tablet at bedtime.  Generalized anxiety disorder -     ALPRAZolam  (XANAX ) 0.25 MG tablet; Take 1 tablet (0.25 mg total) by mouth 2 (two) times daily as needed for anxiety.  Panic attacks -     ALPRAZolam  (XANAX ) 0.25 MG tablet; Take 1 tablet (0.25 mg total) by mouth 2 (two) times daily as needed for anxiety.  Insomnia, unspecified type     Please see After Visit Summary for patient specific instructions.  No future appointments.   No orders of the defined types were placed in this encounter.   -------------------------------

## 2024-07-02 ENCOUNTER — Other Ambulatory Visit: Payer: Self-pay | Admitting: Adult Health

## 2024-07-02 DIAGNOSIS — F331 Major depressive disorder, recurrent, moderate: Secondary | ICD-10-CM

## 2024-09-30 ENCOUNTER — Ambulatory Visit (INDEPENDENT_AMBULATORY_CARE_PROVIDER_SITE_OTHER): Admitting: Adult Health
